# Patient Record
Sex: Male | Born: 1970 | Race: White | Hispanic: No | Marital: Married | Smoking: Never smoker
Health system: Southern US, Community
[De-identification: ages and names within clinical notes are randomized; demographics above are authoritative.]

## PROBLEM LIST (undated history)

## (undated) DIAGNOSIS — R7303 Prediabetes: Secondary | ICD-10-CM

## (undated) DIAGNOSIS — I10 Essential (primary) hypertension: Secondary | ICD-10-CM

## (undated) DIAGNOSIS — M1990 Unspecified osteoarthritis, unspecified site: Secondary | ICD-10-CM

## (undated) DIAGNOSIS — I471 Supraventricular tachycardia, unspecified: Secondary | ICD-10-CM

## (undated) DIAGNOSIS — I499 Cardiac arrhythmia, unspecified: Secondary | ICD-10-CM

## (undated) DIAGNOSIS — F419 Anxiety disorder, unspecified: Secondary | ICD-10-CM

## (undated) DIAGNOSIS — K219 Gastro-esophageal reflux disease without esophagitis: Secondary | ICD-10-CM

## (undated) DIAGNOSIS — G4730 Sleep apnea, unspecified: Secondary | ICD-10-CM

## (undated) HISTORY — DX: Supraventricular tachycardia, unspecified: I47.10

## (undated) HISTORY — DX: Gastro-esophageal reflux disease without esophagitis: K21.9

## (undated) HISTORY — DX: Essential (primary) hypertension: I10

## (undated) HISTORY — PX: JOINT REPLACEMENT: SHX530

## (undated) HISTORY — DX: Anxiety disorder, unspecified: F41.9

## (undated) HISTORY — DX: Supraventricular tachycardia: I47.1

## (undated) HISTORY — PX: ATRIAL ABLATION SURGERY: SHX560

---

## 2006-06-19 ENCOUNTER — Ambulatory Visit: Payer: Self-pay | Admitting: Internal Medicine

## 2006-07-03 ENCOUNTER — Ambulatory Visit: Payer: Self-pay

## 2006-08-09 ENCOUNTER — Ambulatory Visit: Payer: Self-pay | Admitting: Internal Medicine

## 2009-06-24 ENCOUNTER — Ambulatory Visit: Payer: Self-pay | Admitting: Cardiovascular Disease

## 2009-06-29 ENCOUNTER — Telehealth: Payer: Self-pay | Admitting: Cardiovascular Disease

## 2010-04-05 NOTE — Progress Notes (Signed)
  Phone Note Outgoing Call   Call placed by: Dessie Coma LPN Call placed to: Patient Summary of Call: LMVM-notified patient per Dr. Freida Busman, all labs are OK.  If any questions to call office.

## 2010-07-19 NOTE — Assessment & Plan Note (Signed)
[facility]                        [place] [facility] OFFICE NOTE   NAME:Kirsh, Mihcael                         MRN:          [id number]  DATE:06/24/2009                            DOB:          02/02/1971    CHIEF COMPLAINT:  Chest pain.   HISTORY OF PRESENT ILLNESS:  Mr. Thomas [name] is a 40 year old male, past  medical history significant for SVT status post ablation, anxiety,  alcohol use, hypertension, reflux who is presenting with several-week  history of worsening chest pain.  The patient states he has been having  substernal chest discomfort.  It is oftentimes elicited by swallowing  solids or liquids.  Sometimes at night, he has the pain without eating.  He also occasionally during the day has the discomfort without eating.  It usually lasts anywhere from a few minutes to an hour and half.  In  the past week, he has had 2 visits to the emergency room for this  problem.  As a result, he has had a couple sets of negative troponin and  underwent an exercise stress test last week, which he tolerated well,  but the official results of which are not currently available.  In  addition to the above complaint, the patient also endorses occasional  dizziness, headaches, palpitations associated with these symptoms.  He  also endorses pins and needles type feeling in his feet for the past  year.   PAST MEDICAL HISTORY:  As above in HPI.   SOCIAL HISTORY:  Tobacco, the patient uses smokeless tobacco.  He drinks  several alcoholic beverages a day.   FAMILY HISTORY:  Negative for premature coronary artery disease.   ALLERGIES:  No known drug allergies.   MEDICATIONS:  Metoprolol 100 mg b.i.d., hydroxyzine, zolpidem, verapamil  60 mg daily, an antacid, vitamin D, enalapril 20 mg daily.   REVIEW OF SYSTEMS:  As in HPI.  The patient also endorses anxiety.  Other systems were reviewed and are negative.   PHYSICAL EXAMINATION:  VITAL SIGNS:  The patient has  a blood pressure  123/81, pulse 83, satting 97% on room air, and he weighs 242 pounds.  GENERAL:  No acute distress.  HEENT:  Normocephalic, atraumatic.  NECK:  Supple.  There is no JVD.  No carotid bruits.  HEART:  Regular rate and rhythm without murmur, rub, or gallop.  LUNGS:  Clear bilaterally.  ABDOMEN:  Soft, nontender, nondistended.  EXTREMITIES:  Without edema.  SKIN:  Warm and dry.  MUSCULOSKELETAL:  Bilateral upper and lower extremity strength 5/5.   EKG taken today in clinic, independently interpreted by myself,  demonstrates normal sinus rhythm without any significant ST or T-wave  abnormalities.  I reviewed the patient's labs from June 23, 2009.  His  CMP is within normal limits with the exception of a CPK, it is mildly  elevated 510, CK-MB is 2.3.  His troponin is less than 0.01.  his chest  x-ray showed no acute disease.  He had an ultrasound of his abdomen that  showed no evidence of gallstones within the bulb, gallbladder and there  was fatty liver.  He had an echo from 2008 that shows normal ejection  fraction, no significant valvular regurgitation.   ASSESSMENT AND PLAN:  A 40 year old white male presenting with atypical  symptoms.  The patient has had a stress test and it will be a paramount  to get the results of the stress test.  If indeed it was negative, I  think it is highly unlikely he has a cardiac origin for these symptoms.  I have recommended he pursue gastrointestinal consultation for probable  esophagogastroduodenoscopy to rule out esophageal stricture and or  peptic ulcer disease.  I am also placing the patient on a 48-hour Holter  monitor as he does have a history of supraventricular tachycardia and  there may be recurrence, although it would not explain all of the  symptoms that he is presenting with.  He has an antacid at home, and I  recommend that he take it twice a day.  He drinks alcohol significantly  and the only lab abnormality I appreciate  was an elevated total CK,  which may be secondary to some low grade rhabdo from dehydration.  The  patient should remain well hydrated.  Lastly, I will check a D-dimer and  BNP.     Brayton El, MD  Electronically Signed    SGA/MedQ  DD: 06/24/2009  DT: 06/25/2009  Job #: [id number]

## 2010-07-19 NOTE — Assessment & Plan Note (Signed)
[facility]                         ELECTROPHYSIOLOGY OFFICE NOTE   NAME:Thomas [name], Thomas [name]                         MRN:          [id number]  DATE:08/09/2006                            DOB:          12/09/1970    Mr. Carcamo returns today for followup.  He is referred by Dr. Tomie China  and Dr. Sheria Lang for evaluation of palpitations, status post ablation.  The patient is a very pleasant 40 year old man who has a history of SVT  and underwent catheter ablation approximately one year ago.  He had  recurrent palpitations and was referred to me for additional evaluation.  He subsequently wore a cardiac monitor, and this has demonstrated sinus  tachycardia.  He continued to have palpitations.  These are almost never  with rest, typically with mild exertion while going from the sitting to  the standing and walking position.  He has had no syncope.   OTHER PAST MEDICAL HISTORY:  1. Hypertension.  2. He also has a history of pancreatitis and had a recent      hospitalization for this several weeks ago after going on an      alcoholic binge.   The patient otherwise has no specific complaints.   PHYSICAL EXAMINATION:  GENERAL:  He is a pleasant, well-appearing young  man in no distress.  VITAL SIGNS:  Blood pressure 122/88, the pulse was 88 and regular,  respirations were 18, the weight was 218 pounds.  NECK:  Revealed no jugular venous distention.  LUNGS:  Clear bilaterally to auscultation.  No wheezes, rales, or  rhonchi.  CARDIOVASCULAR:  Regular rate and rhythm, with normal S1, S2.  EXTREMITIES:  Demonstrated no edema.   MEDICATIONS:  1. Enalapril 20 mg daily.  2. Verapamil 120 daily.  3. Toprol-XL 100 twice a day.  4. Lorazepam.   IMPRESSION:  1. History of supraventricular tachycardia, status post ablation.  2. Recurrent palpitations.  3. Hypertension.  4. History of pancreatitis.   DISCUSSION:  Overall, today Mr. Mcgilvery is stable.  I have  recommended  that in addition to continuing his medications he continue to exercise  on a regular basis.  I think his underlying problem is relatively  inappropriate sinus tachycardia, perhaps exacerbated by some postural  orthostatic tachycardia.  I have recommended that he continue his  medications and that he  increase his activity and try to have daily aerobic exercise.  I will  plan to see him back in the office in one year, sooner should he have  any additional problems.     Doylene Canning. Ladona Ridgel, MD  Electronically Signed    GWT/MedQ  DD: 08/09/2006  DT: 08/09/2006  Job #: [id number]   cc:   Desmond Dike, M.D.

## 2010-07-22 NOTE — Assessment & Plan Note (Signed)
[facility]                         ELECTROPHYSIOLOGY OFFICE NOTE   NAME:Thomas [name], Thomas [name]                         MRN:          [id number]  DATE:06/19/2006                            DOB:          01-04-1971    REFERRING PHYSICIAN:  Desmond Dike, MD   REASON FOR CONSULTATION:  Evaluation of palpitations and recurrent  tachycardia in a patient who has undergone a catheter ablation of his  SVT.   HISTORY OF PRESENT ILLNESS:  The patient is a 40 year old man whose  health has been pretty good other than some hypertension and obesity. He  has noted palpitations for several years. These initially would start  suddenly and last for several minutes, sometimes even as long as an  hour. He could not make them terminate on his own but noted that if he  would lie down and fall asleep, when he awoke his heart racing would  have terminated. He underwent electrophysiologic study by Dr. Karie Chimera  over in [facility] back in July 2007. At that time, he had no sustained  inducible SVT but did have echo beats and on Isoproterenol had  nonsustained SVT which was consistent with AV node reentrant tachycardia  based on the short RP interval and the midline atrial activation of the  atria in tachycardia. He underwent slow pathway modification. It was  noted at that time that he did have what was described as inappropriate  sinus tachycardia based on a rapid sinus rate. Since his ablation, the  patient has continued to have palpations and has been on escalating  doses of sinus nodal blocking drugs. Despite this, he continues to feel  like his heart is beating faster than it should be and also notes that  he feels fatigued. He also states that he is gaining weight despite not  eating anymore than normally. He has had no syncope. He denies  peripheral edema. He states that his thyroid has been evaluated and was  not appreciably abnormal.   ADDITIONAL PAST MEDICAL HISTORY:   Notable for knee surgery in 1995.   FAMILY HISTORY:  Notable for both parents being alive with no major  medical problems.   SOCIAL HISTORY:  The patient works as a Psychologist, forensic. He  denies tobacco abuse presently though he did smoke in the past. He  denies alcohol or recreational drug use. He drinks 2 caffeinated  beverages daily.   REVIEW OF SYSTEMS:  He denies vision or hearing problems. He denies any  problems with swallowing. He denies nausea, vomiting, diarrhea, or  constipation, polyuria, polydipsia, heat or cold intolerance. He does  not some chest discomfort. He notes palpitations as noted. He denies  dyspnea or shortness of breath but does have dizziness and light-  headedness, particularly when it feels like his heart is racing. He also  admits to headaches. He denies peripheral edema. He does not have  claudication. The rest of his review of systems was negative except as  noted in the HPI.   PHYSICAL EXAMINATION:  GENERAL:  He is a pleasant, obese, 40 year old  man in no acute  distress.  VITAL SIGNS:  His blood pressure was 140/93, the pulse was 91 and  regular, the respirations were 18. The weight was 239 pounds.  HEENT:  Normocephalic, atraumatic. Pupils equal and round. The  oropharynx is moist, the sclera anicteric.  NECK:  Revealed no jugular venous distention. There was no thyromegaly.  Trachea was midline. The carotids are 2+ and symmetric.  LUNGS:  Clear bilaterally to auscultation. There were no wheezes, rales  or rhonchi. There was no increased work of breathing.  CARDIOVASCULAR:  Regular rate and rhythm with normal S1 and S2. The PMI  was not enlarged nor laterally displaced. I did not appreciate any  murmurs except I did note that there was a soft S4 gallop present.  ABDOMEN:  Obese, nontender, nondistended. There was no organomegaly. The  bowel sounds were present and there was no rebound or guarding.  EXTREMITIES:  Demonstrated no cyanosis,  clubbing or edema.  The pulses  were 2+ and symmetric.  NEUROLOGIC:  Alert and oriented x3 with cranial nerves intact. His  strength was 5/5 and symmetric.  SKIN:  Normal except for a laceration to his healing on his left  forearm.  JOINTS:  The joint exam was unremarkable as well.   MEDICATIONS:  1. Enalapril 20 mg daily.  2. Verapamil 120 mg 2 tablets daily now down to 1 tablet daily.  3. Metoprolol 50 mg 2 tablets twice daily.  4. Seroquel 100 mg daily.  5. Lorazepam 1 mg twice a day.  6. Bayer aspirin daily.   IMPRESSION:  1. Recurrent tachypalpitations after catheter ablation.  2. Hypertension.  3. Obesity.   DISCUSSION:  The patient's palpitations are of unclear etiology. They  may all be sinus tachycardia. I wonder what his sedentary lifestyle and  relative obesity playing with regard to increasing palpitations. I also  wonder whether his medical therapy is making him more fatigued. My plan  is the have him wean down off of his metoprolol, down off of his  verapamil and have him undergo exercise treadmill testing so that we can  better characterize the effect of exercise on his sinus node.  Particularly in light of his catheter ablation. I should also note that  he has first-degree heart block. Finally, we will have him wear a 3-week  CardioNet monitor to try to  better understand whether his palpitations are related to sinus  tachycardia or whether he has recurrence of SVT. We will plan to see him  back in the office in 5-6 weeks.     Doylene Canning. Ladona Ridgel, MD  Electronically Signed    GWT/MedQ  DD: 06/19/2006  DT: 06/19/2006  Job #: [id number]   cc:   Desmond Dike, MD

## 2010-11-27 ENCOUNTER — Encounter: Payer: Self-pay | Admitting: Cardiovascular Disease

## 2011-01-02 ENCOUNTER — Encounter: Payer: Self-pay | Admitting: Cardiovascular Disease

## 2016-12-05 ENCOUNTER — Ambulatory Visit: Payer: Self-pay | Admitting: Cardiology

## 2017-06-11 ENCOUNTER — Other Ambulatory Visit: Payer: Self-pay

## 2017-06-11 ENCOUNTER — Encounter (HOSPITAL_COMMUNITY): Payer: Self-pay | Admitting: *Deleted

## 2017-06-11 NOTE — Progress Notes (Signed)
Need orders in epic .  Surgery on 06/12/2017.

## 2017-06-11 NOTE — Progress Notes (Addendum)
[facility] and [facility] will have to have Medical Release Form for EKG done 09/2016 at the [facility].  It is located with fax cover on front of chart. For patient to sign and date for the EKG.   [facility] and placed on chart.  Will still need to send Signed Med Release Form.

## 2017-06-12 ENCOUNTER — Ambulatory Visit (HOSPITAL_COMMUNITY): Payer: Non-veteran care | Admitting: Anesthesiology

## 2017-06-12 ENCOUNTER — Ambulatory Visit (HOSPITAL_COMMUNITY): Payer: Non-veteran care

## 2017-06-12 ENCOUNTER — Encounter (HOSPITAL_COMMUNITY): Payer: Self-pay | Admitting: *Deleted

## 2017-06-12 ENCOUNTER — Encounter (HOSPITAL_COMMUNITY)
Admission: RE | Disposition: A | Discharge status: Home / Self Care | Payer: Self-pay | Source: Ambulatory Visit | Attending: Orthopedic Surgery

## 2017-06-12 ENCOUNTER — Other Ambulatory Visit: Payer: Self-pay

## 2017-06-12 ENCOUNTER — Inpatient Hospital Stay (HOSPITAL_COMMUNITY)
Admission: RE | Admit: 2017-06-12 | Discharge: 2017-06-13 | DRG: 488 | Disposition: A | Discharge status: Home / Self Care | Payer: Non-veteran care | Source: Ambulatory Visit | Attending: Orthopedic Surgery | Admitting: Orthopedic Surgery

## 2017-06-12 DIAGNOSIS — M9712XA Periprosthetic fracture around internal prosthetic left knee joint, initial encounter: Secondary | ICD-10-CM | POA: Diagnosis present

## 2017-06-12 DIAGNOSIS — I10 Essential (primary) hypertension: Secondary | ICD-10-CM | POA: Diagnosis present

## 2017-06-12 DIAGNOSIS — S82142A Displaced bicondylar fracture of left tibia, initial encounter for closed fracture: Principal | ICD-10-CM | POA: Diagnosis present

## 2017-06-12 DIAGNOSIS — G4730 Sleep apnea, unspecified: Secondary | ICD-10-CM | POA: Diagnosis present

## 2017-06-12 DIAGNOSIS — W19XXXA Unspecified fall, initial encounter: Secondary | ICD-10-CM | POA: Diagnosis present

## 2017-06-12 DIAGNOSIS — T84023A Instability of internal left knee prosthesis, initial encounter: Secondary | ICD-10-CM | POA: Diagnosis present

## 2017-06-12 DIAGNOSIS — M2500 Hemarthrosis, unspecified joint: Secondary | ICD-10-CM | POA: Diagnosis present

## 2017-06-12 DIAGNOSIS — F17290 Nicotine dependence, other tobacco product, uncomplicated: Secondary | ICD-10-CM | POA: Diagnosis present

## 2017-06-12 DIAGNOSIS — K219 Gastro-esophageal reflux disease without esophagitis: Secondary | ICD-10-CM | POA: Diagnosis present

## 2017-06-12 DIAGNOSIS — Z6841 Body Mass Index (BMI) 40.0 and over, adult: Secondary | ICD-10-CM

## 2017-06-12 DIAGNOSIS — Z419 Encounter for procedure for purposes other than remedying health state, unspecified: Secondary | ICD-10-CM

## 2017-06-12 HISTORY — DX: Unspecified osteoarthritis, unspecified site: M19.90

## 2017-06-12 HISTORY — PX: ORIF TIBIA PLATEAU: SHX2132

## 2017-06-12 HISTORY — DX: Sleep apnea, unspecified: G47.30

## 2017-06-12 HISTORY — DX: Prediabetes: R73.03

## 2017-06-12 LAB — CBC
HCT: 41.1 % (ref 39.0–52.0)
HEMOGLOBIN: 13.9 g/dL (ref 13.0–17.0)
MCH: 28.8 pg (ref 26.0–34.0)
MCHC: 33.8 g/dL (ref 30.0–36.0)
MCV: 85.3 fL (ref 78.0–100.0)
PLATELETS: 156 10*3/uL (ref 150–400)
RBC: 4.82 MIL/uL (ref 4.22–5.81)
RDW: 15.3 % (ref 11.5–15.5)
WBC: 5.9 10*3/uL (ref 4.0–10.5)

## 2017-06-12 LAB — TYPE AND SCREEN
ABO/RH(D): A POS
ANTIBODY SCREEN: NEGATIVE

## 2017-06-12 LAB — BASIC METABOLIC PANEL
ANION GAP: 7 (ref 5–15)
BUN: 21 mg/dL — ABNORMAL HIGH (ref 6–20)
CHLORIDE: 109 mmol/L (ref 101–111)
CO2: 26 mmol/L (ref 22–32)
Calcium: 8.5 mg/dL — ABNORMAL LOW (ref 8.9–10.3)
Creatinine, Ser: 0.68 mg/dL (ref 0.61–1.24)
Glucose, Bld: 96 mg/dL (ref 65–99)
POTASSIUM: 3.9 mmol/L (ref 3.5–5.1)
SODIUM: 142 mmol/L (ref 135–145)

## 2017-06-12 LAB — GLUCOSE, CAPILLARY: GLUCOSE-CAPILLARY: 85 mg/dL (ref 65–99)

## 2017-06-12 LAB — HEMOGLOBIN A1C
HEMOGLOBIN A1C: 5.5 % (ref 4.8–5.6)
Mean Plasma Glucose: 111.15 mg/dL

## 2017-06-12 LAB — ABO/RH: ABO/RH(D): A POS

## 2017-06-12 SURGERY — OPEN REDUCTION INTERNAL FIXATION (ORIF) TIBIAL PLATEAU
Anesthesia: Spinal | Laterality: Left

## 2017-06-12 MED ORDER — HYDROMORPHONE HCL 1 MG/ML IJ SOLN: 0.5000 mg | INTRAMUSCULAR | Status: DC | PRN

## 2017-06-12 MED ORDER — 0.9 % SODIUM CHLORIDE (POUR BTL) OPTIME
TOPICAL | Status: DC | PRN
  Administered 2017-06-12: 1000 mL

## 2017-06-12 MED ORDER — POLYETHYLENE GLYCOL 3350 17 G PO PACK
17.0000 g | PACK | Freq: Two times a day (BID) | ORAL | Status: DC
  Filled 2017-06-12: qty 1

## 2017-06-12 MED ORDER — METHOCARBAMOL 500 MG PO TABS
500.0000 mg | ORAL_TABLET | Freq: Four times a day (QID) | ORAL | Status: DC | PRN
  Administered 2017-06-12: 22:00:00 500 mg via ORAL
  Filled 2017-06-12: qty 1

## 2017-06-12 MED ORDER — METOCLOPRAMIDE HCL 5 MG/ML IJ SOLN: 5.0000 mg | Freq: Three times a day (TID) | INTRAMUSCULAR | Status: DC | PRN

## 2017-06-12 MED ORDER — ACETAMINOPHEN 325 MG PO TABS: 325.0000 mg | ORAL_TABLET | Freq: Four times a day (QID) | ORAL | Status: DC | PRN

## 2017-06-12 MED ORDER — FERROUS SULFATE 325 (65 FE) MG PO TABS
325.0000 mg | ORAL_TABLET | Freq: Three times a day (TID) | ORAL | Status: DC
  Administered 2017-06-13: 325 mg via ORAL
  Filled 2017-06-12: qty 1

## 2017-06-12 MED ORDER — TRAZODONE HCL 100 MG PO TABS
200.0000 mg | ORAL_TABLET | Freq: Every day | ORAL | Status: DC
  Administered 2017-06-12: 200 mg via ORAL
  Filled 2017-06-12: qty 2

## 2017-06-12 MED ORDER — PROPOFOL 10 MG/ML IV BOLUS
INTRAVENOUS | Status: AC
  Filled 2017-06-12: qty 20

## 2017-06-12 MED ORDER — OXYCODONE HCL 5 MG PO TABS: 10.0000 mg | ORAL_TABLET | ORAL | Status: DC | PRN

## 2017-06-12 MED ORDER — METHOCARBAMOL 1000 MG/10ML IJ SOLN
500.0000 mg | Freq: Four times a day (QID) | INTRAVENOUS | Status: DC | PRN
  Administered 2017-06-12: 500 mg via INTRAVENOUS
  Filled 2017-06-12: qty 550

## 2017-06-12 MED ORDER — DOCUSATE SODIUM 100 MG PO CAPS
100.0000 mg | ORAL_CAPSULE | Freq: Two times a day (BID) | ORAL | Status: DC
  Administered 2017-06-12 – 2017-06-13 (×2): 100 mg via ORAL
  Filled 2017-06-12 (×2): qty 1

## 2017-06-12 MED ORDER — PROPOFOL 500 MG/50ML IV EMUL
INTRAVENOUS | Status: DC | PRN
  Administered 2017-06-12: 25 ug/kg/min via INTRAVENOUS

## 2017-06-12 MED ORDER — KETAMINE HCL 10 MG/ML IJ SOLN
INTRAMUSCULAR | Status: AC
  Filled 2017-06-12: qty 1

## 2017-06-12 MED ORDER — KETAMINE HCL 10 MG/ML IJ SOLN
INTRAMUSCULAR | Status: DC | PRN
  Administered 2017-06-12 (×2): 10 mg via INTRAVENOUS
  Administered 2017-06-12: 20 mg via INTRAVENOUS

## 2017-06-12 MED ORDER — FENTANYL CITRATE (PF) 100 MCG/2ML IJ SOLN
100.0000 ug | Freq: Once | INTRAMUSCULAR | Status: AC
  Administered 2017-06-12: 50 ug via INTRAVENOUS
  Filled 2017-06-12: qty 2

## 2017-06-12 MED ORDER — ASPIRIN EC 325 MG PO TBEC
325.0000 mg | DELAYED_RELEASE_TABLET | Freq: Two times a day (BID) | ORAL | Status: DC
  Administered 2017-06-13: 325 mg via ORAL
  Filled 2017-06-12: qty 1

## 2017-06-12 MED ORDER — HYDROXYZINE HCL 25 MG PO TABS: 25.0000 mg | ORAL_TABLET | Freq: Three times a day (TID) | ORAL | Status: DC | PRN

## 2017-06-12 MED ORDER — FENTANYL CITRATE (PF) 100 MCG/2ML IJ SOLN: 25.0000 ug | INTRAMUSCULAR | Status: DC | PRN

## 2017-06-12 MED ORDER — ONDANSETRON HCL 4 MG PO TABS
4.0000 mg | ORAL_TABLET | Freq: Four times a day (QID) | ORAL | Status: DC | PRN
Start: 2017-06-12 — End: 2017-06-13

## 2017-06-12 MED ORDER — OXYCODONE HCL 5 MG/5ML PO SOLN
5.0000 mg | Freq: Once | ORAL | Status: DC | PRN
  Filled 2017-06-12: qty 5

## 2017-06-12 MED ORDER — MIDAZOLAM HCL 2 MG/2ML IJ SOLN
INTRAMUSCULAR | Status: AC
  Filled 2017-06-12: qty 2

## 2017-06-12 MED ORDER — OXYCODONE HCL 5 MG PO TABS: 5.0000 mg | ORAL_TABLET | Freq: Once | ORAL | Status: DC | PRN

## 2017-06-12 MED ORDER — VERAPAMIL HCL 120 MG PO TABS
120.0000 mg | ORAL_TABLET | Freq: Every day | ORAL | Status: DC
  Administered 2017-06-13: 120 mg via ORAL
  Filled 2017-06-12: qty 1

## 2017-06-12 MED ORDER — METOCLOPRAMIDE HCL 5 MG PO TABS: 5.0000 mg | ORAL_TABLET | Freq: Three times a day (TID) | ORAL | Status: DC | PRN

## 2017-06-12 MED ORDER — ONDANSETRON HCL 4 MG/2ML IJ SOLN
INTRAMUSCULAR | Status: DC | PRN
  Administered 2017-06-12: 4 mg via INTRAVENOUS

## 2017-06-12 MED ORDER — NON FORMULARY: 20.0000 mg | Freq: Every day | Status: DC

## 2017-06-12 MED ORDER — DEXAMETHASONE SODIUM PHOSPHATE 10 MG/ML IJ SOLN: 10.0000 mg | Freq: Once | INTRAMUSCULAR | Status: DC

## 2017-06-12 MED ORDER — PHENYLEPHRINE 40 MCG/ML (10ML) SYRINGE FOR IV PUSH (FOR BLOOD PRESSURE SUPPORT)
PREFILLED_SYRINGE | INTRAVENOUS | Status: DC | PRN
  Administered 2017-06-12: 80 ug via INTRAVENOUS
  Administered 2017-06-12: 120 ug via INTRAVENOUS
  Administered 2017-06-12 (×3): 80 ug via INTRAVENOUS

## 2017-06-12 MED ORDER — OMEPRAZOLE 20 MG PO CPDR
20.0000 mg | DELAYED_RELEASE_CAPSULE | Freq: Every day | ORAL | Status: DC
  Administered 2017-06-13: 20 mg via ORAL
  Filled 2017-06-12: qty 1

## 2017-06-12 MED ORDER — PROMETHAZINE HCL 25 MG/ML IJ SOLN: 6.2500 mg | INTRAMUSCULAR | Status: DC | PRN

## 2017-06-12 MED ORDER — CELECOXIB 200 MG PO CAPS
200.0000 mg | ORAL_CAPSULE | Freq: Two times a day (BID) | ORAL | Status: DC
  Administered 2017-06-12 – 2017-06-13 (×2): 200 mg via ORAL
  Filled 2017-06-12 (×2): qty 1

## 2017-06-12 MED ORDER — BISACODYL 10 MG RE SUPP: 10.0000 mg | Freq: Every day | RECTAL | Status: DC | PRN

## 2017-06-12 MED ORDER — MEPERIDINE HCL 50 MG/ML IJ SOLN: 6.2500 mg | INTRAMUSCULAR | Status: DC | PRN

## 2017-06-12 MED ORDER — ONDANSETRON HCL 4 MG/2ML IJ SOLN: 4.0000 mg | Freq: Four times a day (QID) | INTRAMUSCULAR | Status: DC | PRN

## 2017-06-12 MED ORDER — SODIUM CHLORIDE 0.9 % IV SOLN
INTRAVENOUS | Status: DC
  Administered 2017-06-12: 17:00:00 via INTRAVENOUS

## 2017-06-12 MED ORDER — MAGNESIUM CITRATE PO SOLN: 1.0000 | Freq: Once | ORAL | Status: DC | PRN

## 2017-06-12 MED ORDER — PHENYLEPHRINE HCL 10 MG/ML IJ SOLN
INTRAVENOUS | Status: DC | PRN
  Administered 2017-06-12: 25 ug/min via INTRAVENOUS

## 2017-06-12 MED ORDER — PHENOL 1.4 % MT LIQD
1.0000 | OROMUCOSAL | Status: DC | PRN
  Filled 2017-06-12: qty 177

## 2017-06-12 MED ORDER — ESCITALOPRAM OXALATE 20 MG PO TABS
20.0000 mg | ORAL_TABLET | Freq: Every day | ORAL | Status: DC
  Administered 2017-06-13: 09:00:00 20 mg via ORAL
  Filled 2017-06-12: qty 1

## 2017-06-12 MED ORDER — CEFAZOLIN SODIUM-DEXTROSE 2-4 GM/100ML-% IV SOLN
2.0000 g | Freq: Four times a day (QID) | INTRAVENOUS | Status: AC
  Administered 2017-06-12 – 2017-06-13 (×2): 2 g via INTRAVENOUS
  Filled 2017-06-12 (×2): qty 100

## 2017-06-12 MED ORDER — METOPROLOL TARTRATE 50 MG PO TABS
100.0000 mg | ORAL_TABLET | Freq: Two times a day (BID) | ORAL | Status: DC
  Administered 2017-06-12 – 2017-06-13 (×2): 100 mg via ORAL
  Filled 2017-06-12 (×2): qty 2

## 2017-06-12 MED ORDER — FENTANYL CITRATE (PF) 250 MCG/5ML IJ SOLN
INTRAMUSCULAR | Status: AC
  Filled 2017-06-12: qty 5

## 2017-06-12 MED ORDER — LIDOCAINE 2% (20 MG/ML) 5 ML SYRINGE
INTRAMUSCULAR | Status: DC | PRN
  Administered 2017-06-12: 40 mg via INTRAVENOUS

## 2017-06-12 MED ORDER — CEFAZOLIN SODIUM 10 G IJ SOLR
3.0000 g | INTRAMUSCULAR | Status: AC
  Administered 2017-06-12: 3 g via INTRAVENOUS
  Filled 2017-06-12: qty 3

## 2017-06-12 MED ORDER — DEXAMETHASONE SODIUM PHOSPHATE 10 MG/ML IJ SOLN
INTRAMUSCULAR | Status: DC | PRN
  Administered 2017-06-12: 10 mg via INTRAVENOUS

## 2017-06-12 MED ORDER — MIDAZOLAM HCL 2 MG/2ML IJ SOLN
2.0000 mg | Freq: Once | INTRAMUSCULAR | Status: AC
  Administered 2017-06-12: 2 mg via INTRAVENOUS
  Filled 2017-06-12: qty 2

## 2017-06-12 MED ORDER — MENTHOL 3 MG MT LOZG: 1.0000 | LOZENGE | OROMUCOSAL | Status: DC | PRN

## 2017-06-12 MED ORDER — STERILE WATER FOR IRRIGATION IR SOLN
Status: DC | PRN
  Administered 2017-06-12: 2000 mL

## 2017-06-12 MED ORDER — PROPOFOL 10 MG/ML IV BOLUS
INTRAVENOUS | Status: DC | PRN
  Administered 2017-06-12 (×3): 10 mg via INTRAVENOUS

## 2017-06-12 MED ORDER — ALUM & MAG HYDROXIDE-SIMETH 200-200-20 MG/5ML PO SUSP: 15.0000 mL | ORAL | Status: DC | PRN

## 2017-06-12 MED ORDER — CHLORHEXIDINE GLUCONATE 4 % EX LIQD
60.0000 mL | Freq: Once | CUTANEOUS | Status: AC
  Administered 2017-06-12: 4 via TOPICAL

## 2017-06-12 MED ORDER — OXYCODONE HCL 5 MG PO TABS
5.0000 mg | ORAL_TABLET | ORAL | Status: DC | PRN
  Administered 2017-06-12: 21:00:00 10 mg via ORAL
  Administered 2017-06-12 (×2): 5 mg via ORAL
  Administered 2017-06-13 (×3): 10 mg via ORAL
  Filled 2017-06-12 (×2): qty 2
  Filled 2017-06-12: qty 1
  Filled 2017-06-12 (×2): qty 2
  Filled 2017-06-12: qty 1
  Filled 2017-06-12: qty 2

## 2017-06-12 MED ORDER — LACTATED RINGERS IV SOLN
INTRAVENOUS | Status: DC
  Administered 2017-06-12 (×3): via INTRAVENOUS

## 2017-06-12 MED ORDER — PREGABALIN 100 MG PO CAPS
200.0000 mg | ORAL_CAPSULE | Freq: Three times a day (TID) | ORAL | Status: DC
  Administered 2017-06-12 – 2017-06-13 (×3): 200 mg via ORAL
  Filled 2017-06-12 (×3): qty 2

## 2017-06-12 MED ORDER — BUPIVACAINE IN DEXTROSE 0.75-8.25 % IT SOLN
INTRATHECAL | Status: DC | PRN
  Administered 2017-06-12: 2 mL via INTRATHECAL

## 2017-06-12 SURGICAL SUPPLY — 84 items
BAG ZIPLOCK 12X15 (MISCELLANEOUS) ×3 IMPLANT
BANDAGE ACE 6X5 VEL STRL LF (GAUZE/BANDAGES/DRESSINGS) ×3 IMPLANT
BEARING OX PART MENISCL LT TIB (Orthopedic Implant) ×1 IMPLANT
BIT DRILL 2.5X2.75 QC CALB (BIT) ×3 IMPLANT
BLADE SAW SGTL 11.0X1.19X90.0M (BLADE) IMPLANT
BNDG COHESIVE 4X5 TAN STRL (GAUZE/BANDAGES/DRESSINGS) ×3 IMPLANT
BNDG COHESIVE 6X5 TAN STRL LF (GAUZE/BANDAGES/DRESSINGS) ×3 IMPLANT
CLOSURE WOUND 1/2 X4 (GAUZE/BANDAGES/DRESSINGS)
COVER SURGICAL LIGHT HANDLE (MISCELLANEOUS) ×3 IMPLANT
CUFF TOURN SGL QUICK 34 (TOURNIQUET CUFF)
CUFF TOURN SGL QUICK 44 (TOURNIQUET CUFF) ×3 IMPLANT
CUFF TRNQT CYL 34X4X40X1 (TOURNIQUET CUFF) IMPLANT
DERMABOND ADVANCED (GAUZE/BANDAGES/DRESSINGS) ×2
DERMABOND ADVANCED .7 DNX12 (GAUZE/BANDAGES/DRESSINGS) ×1 IMPLANT
DRAPE C-ARM 42X120 X-RAY (DRAPES) ×3 IMPLANT
DRAPE INCISE IOBAN 66X45 STRL (DRAPES) ×3 IMPLANT
DRAPE SHEET LG 3/4 BI-LAMINATE (DRAPES) ×3 IMPLANT
DRAPE U-SHAPE 47X51 STRL (DRAPES) ×3 IMPLANT
DRESSING AQUACEL AG SP 3.5X10 (GAUZE/BANDAGES/DRESSINGS) ×1 IMPLANT
DRSG AQUACEL AG SP 3.5X10 (GAUZE/BANDAGES/DRESSINGS) ×3
DRSG EMULSION OIL 3X16 NADH (GAUZE/BANDAGES/DRESSINGS) IMPLANT
DURAPREP 26ML APPLICATOR (WOUND CARE) ×3 IMPLANT
ELECT REM PT RETURN 15FT ADLT (MISCELLANEOUS) ×3 IMPLANT
GAUZE SPONGE 4X4 12PLY STRL (GAUZE/BANDAGES/DRESSINGS) IMPLANT
GLOVE BIOGEL M 7.0 STRL (GLOVE) IMPLANT
GLOVE BIOGEL PI IND STRL 7.0 (GLOVE) ×2 IMPLANT
GLOVE BIOGEL PI IND STRL 7.5 (GLOVE) ×2 IMPLANT
GLOVE BIOGEL PI IND STRL 8.5 (GLOVE) ×1 IMPLANT
GLOVE BIOGEL PI INDICATOR 7.0 (GLOVE) ×4
GLOVE BIOGEL PI INDICATOR 7.5 (GLOVE) ×4
GLOVE BIOGEL PI INDICATOR 8.5 (GLOVE) ×2
GLOVE ECLIPSE 7.5 STRL STRAW (GLOVE) ×9 IMPLANT
GLOVE ECLIPSE 8.0 STRL XLNG CF (GLOVE) IMPLANT
GLOVE ORTHO TXT STRL SZ7.5 (GLOVE) ×6 IMPLANT
GLOVE SURG ORTHO 8.0 STRL STRW (GLOVE) ×3 IMPLANT
GOWN STRL REUS W/TWL LRG LVL3 (GOWN DISPOSABLE) ×6 IMPLANT
GOWN STRL REUS W/TWL XL LVL3 (GOWN DISPOSABLE) ×6 IMPLANT
HANDPIECE INTERPULSE COAX TIP (DISPOSABLE) ×2
IMMOBILIZER KNEE 20 (SOFTGOODS) ×3
IMMOBILIZER KNEE 20 THIGH 36 (SOFTGOODS) ×1 IMPLANT
IMMOBILIZER KNEE 22 (SOFTGOODS) ×3 IMPLANT
IMMOBILIZER KNEE 22 UNIV (SOFTGOODS) ×3 IMPLANT
KIT BASIN OR (CUSTOM PROCEDURE TRAY) ×3 IMPLANT
MANIFOLD NEPTUNE II (INSTRUMENTS) ×3 IMPLANT
NS IRRIG 1000ML POUR BTL (IV SOLUTION) ×3 IMPLANT
OX PART MENISCAL BEAR LT TIBIA (Orthopedic Implant) ×3 IMPLANT
PACK ORTHO EXTREMITY (CUSTOM PROCEDURE TRAY) ×3 IMPLANT
PAD CAST 4YDX4 CTTN HI CHSV (CAST SUPPLIES) IMPLANT
PADDING CAST COTTON 4X4 STRL (CAST SUPPLIES)
PLATE ACE LRG 74.7X35.6X1X3 HL (Plate) ×1 IMPLANT
PLATE ACE META (Plate) ×2 IMPLANT
POSITIONER SURGICAL ARM (MISCELLANEOUS) ×3 IMPLANT
SCREW CORT FT 32X3.5XNONLOCK (Screw) ×1 IMPLANT
SCREW CORTICAL 3.5MM  30MM (Screw) ×2 IMPLANT
SCREW CORTICAL 3.5MM  32MM (Screw) ×2 IMPLANT
SCREW CORTICAL 3.5MM  34MM (Screw) ×2 IMPLANT
SCREW CORTICAL 3.5MM  60MM (Screw) ×2 IMPLANT
SCREW CORTICAL 3.5MM 30MM (Screw) ×1 IMPLANT
SCREW CORTICAL 3.5MM 34MM (Screw) ×1 IMPLANT
SCREW CORTICAL 3.5MM 36MM (Screw) ×3 IMPLANT
SCREW CORTICAL 3.5MM 40MM (Screw) ×3 IMPLANT
SCREW CORTICAL 3.5MM 60MM (Screw) ×1 IMPLANT
SET HNDPC FAN SPRY TIP SCT (DISPOSABLE) ×1 IMPLANT
SET PAD KNEE POSITIONER (MISCELLANEOUS) ×3 IMPLANT
SPONGE LAP 18X18 X RAY DECT (DISPOSABLE) IMPLANT
STOCKINETTE 8 INCH (MISCELLANEOUS) ×3 IMPLANT
STRIP CLOSURE SKIN 1/2X4 (GAUZE/BANDAGES/DRESSINGS) IMPLANT
SUCTION FRAZIER HANDLE 10FR (MISCELLANEOUS) ×2
SUCTION TUBE FRAZIER 10FR DISP (MISCELLANEOUS) ×1 IMPLANT
SUT ETHILON 4 0 PS 2 18 (SUTURE) IMPLANT
SUT MNCRL AB 3-0 PS2 18 (SUTURE) ×3 IMPLANT
SUT MNCRL AB 4-0 PS2 18 (SUTURE) IMPLANT
SUT STRATAFIX PDS+ 0 24IN (SUTURE) ×3 IMPLANT
SUT VIC AB 0 CT1 36 (SUTURE) ×3 IMPLANT
SUT VIC AB 1 CT1 27 (SUTURE) ×6
SUT VIC AB 1 CT1 27XBRD ANTBC (SUTURE) ×3 IMPLANT
SUT VIC AB 1 CT1 36 (SUTURE) ×3 IMPLANT
SUT VIC AB 2-0 CT1 27 (SUTURE) ×4
SUT VIC AB 2-0 CT1 TAPERPNT 27 (SUTURE) ×2 IMPLANT
SUT VIC AB 2-0 CT2 27 (SUTURE) ×3 IMPLANT
TOWEL OR 17X26 10 PK STRL BLUE (TOWEL DISPOSABLE) ×6 IMPLANT
TRAY FOLEY CATH 16FRSI W/METER (SET/KITS/TRAYS/PACK) ×3 IMPLANT
WATER STERILE IRR 1000ML POUR (IV SOLUTION) ×3 IMPLANT
WRAP KNEE MAXI GEL POST OP (GAUZE/BANDAGES/DRESSINGS) ×3 IMPLANT

## 2017-06-12 NOTE — H&P (Signed)
Thomas [name] is an 47 y.o. male.    Chief Complaint: Left medial tibial plateau fracture with dislocation of poly from UKR  Procedure:  ORIF left medial tibial plateau fracture with poly revision of medial UKR  HPI: Pt is a 47 y.o. male complaining of left knee pain after sustaining a fall.  He presented to the ER where he was diagnosed with a left medial tibial plateau fracture with dislocation of poly from UKR. He was place in a KI made NWB and told to follow up in the clinic.  In the clinic he was seen and determined that he would need to have surgery.  It was discussed that he would need a ORIF left medial tibial plateau fracture with poly revision of medial UKR.   Various options are discussed with the patient. Risks, benefits and expectations were discussed with the patient. Patient understand the risks, benefits and expectations and wishes to proceed with surgery.   PCP: Sondra Come, MD  D/C Plans:       Home   Post-op Meds:       No Rx given   Tranexamic Acid:      To be given - IV   Decadron:      Is to be given  FYI:     ASA  Norco  CPAP  DME:   Pt already has equipment   PT:   To be determined   PMH: Past Medical History:  Diagnosis Date  . Arthritis   . Chest pain   . GERD (gastroesophageal reflux disease)   . HTN (hypertension)   . Pre-diabetes   . Sleep apnea    cpap   . SVT (supraventricular tachycardia) (HCC)     PSH: Past Surgical History:  Procedure Laterality Date  . ATRIAL ABLATION SURGERY      Social History:  reports that he has never smoked. His smokeless tobacco use includes chew and snuff. He reports that he does not drink alcohol or use drugs.  Allergies:  No Known Allergies  Medications: Current Facility-Administered Medications  Medication Dose Route Frequency Provider Last Rate Last Dose  . ceFAZolin (ANCEF) 3 g in dextrose 5 % 50 mL IVPB  3 g Intravenous On Call to OR Timtohy Broski, Molli Hazard, PA-C      . chlorhexidine (HIBICLENS) 4  % liquid 4 application  60 mL Topical Once Loreto Loescher, PA-C      . fentaNYL (SUBLIMAZE) injection 100 mcg  100 mcg Intravenous Once Lewie Loron, MD      . lactated ringers infusion   Intravenous Continuous Lewie Loron, MD      . midazolam (VERSED) injection 2 mg  2 mg Intravenous Once Lewie Loron, MD        Results for orders placed or performed during the hospital encounter of 06/12/17 (from the past 48 hour(s))  Glucose, capillary     Status: None   Collection Time: 06/12/17  8:44 AM  Result Value Ref Range   Glucose-Capillary 85 65 - 99 mg/dL      Review of Systems  Constitutional: Negative.   HENT: Negative.   Eyes: Negative.   Respiratory: Negative.   Cardiovascular: Negative.   Gastrointestinal: Positive for heartburn.  Genitourinary: Negative.   Musculoskeletal: Positive for joint pain.  Skin: Negative.   Neurological: Negative.   Endo/Heme/Allergies: Negative.   Psychiatric/Behavioral: Negative.        Physical Exam  Constitutional: He is oriented to person, place, and time. He appears well-developed.  HENT:  Head: Normocephalic.  Eyes: Pupils are equal, round, and reactive to light.  Neck: Neck supple. No JVD present. No tracheal deviation present. No thyromegaly present.  Cardiovascular: Normal rate, regular rhythm and intact distal pulses.  Respiratory: Effort normal and breath sounds normal. No respiratory distress. He has no wheezes.  GI: Soft. There is no tenderness. There is no guarding.  Musculoskeletal:       Left knee: He exhibits decreased range of motion, swelling, laceration (healed previous incision) and bony tenderness. He exhibits no effusion, no deformity and no erythema. Tenderness found. Medial joint line tenderness noted.  Lymphadenopathy:    He has no cervical adenopathy.  Neurological: He is alert and oriented to person, place, and time.  Skin: Skin is warm and dry.  Psychiatric: He has a normal mood and affect.        Assessment/Plan Assessment:   Left medial tibial plateau fracture with dislocation of poly from UKR  Plan: Patient will undergo a ORIF left medial tibial plateau fracture with poly revision of medial UKR on 06/12/2017 [facility]. Risks benefits and expectations were discussed with the patient. Patient understand risks, benefits and expectations and wishes to proceed.   Anastasio AuerbachMatthew S. Khi Mcmillen   PA-C  06/12/2017, 9:39 AM

## 2017-06-12 NOTE — Transfer of Care (Signed)
Immediate Anesthesia Transfer of Care Note  Patient: Thomas [name]  Procedure(s) Performed: Left open reduction internal fixation medial tibial plateau fracture with poly revision medial compartmental arthroplasty (Left )  Patient Location: PACU  Anesthesia Type:Spinal  Level of Consciousness: awake and alert   Airway & Oxygen Therapy: Patient Spontanous Breathing and Patient connected to face mask oxygen  Post-op Assessment: Report given to RN and Post -op Vital signs reviewed and stable  Post vital signs: Reviewed and stable  Last Vitals:  Vitals Value Taken Time  BP 119/70 06/12/2017  1:40 PM  Temp    Pulse 63 06/12/2017  1:42 PM  Resp 11 06/12/2017  1:42 PM  SpO2 100 % 06/12/2017  1:42 PM  Vitals shown include unvalidated device data.  Last Pain:  Vitals:   06/12/17 0950  TempSrc:   PainSc: 0-No pain         Complications: No apparent anesthesia complications

## 2017-06-12 NOTE — Brief Op Note (Signed)
06/12/2017  11:44 AM  PATIENT:  Thomas [name]  47 y.o. male  PRE-OPERATIVE DIAGNOSIS:  Left medial tibial plateau fracture with dislocation polyethylene from partial knee replacement  POST-OPERATIVE DIAGNOSIS:   Left medial tibial plateau fracture with dislocation polyethylene from partial knee replacement  PROCEDURE:  Procedure(s) with comments: Left open reduction internal fixation medial tibial plateau fracture with poly revision medial compartmental arthroplasty (Left) - 90 mins  SURGEON:  Surgeon(s) and Role:    Durene Romans* Alayziah Tangeman, MD - Primary  PHYSICIAN ASSISTANT: Skip MayerBlair Roberts, PA-C   ANESTHESIA:   regional and spinal  EBL:  <50 cc   BLOOD ADMINISTERED:none  DRAINS: none   LOCAL MEDICATIONS USED:  NONE  SPECIMEN:  No Specimen  DISPOSITION OF SPECIMEN:  N/A  COUNTS:  YES  TOURNIQUET:  31 min at 250 mm Hg  DICTATION: .Other Dictation: Dictation Number [id number]  PLAN OF CARE: Admit to inpatient   PATIENT DISPOSITION:  PACU - hemodynamically stable.   Delay start of Pharmacological VTE agent (>24hrs) due to surgical blood loss or risk of bleeding: no

## 2017-06-12 NOTE — Anesthesia Procedure Notes (Signed)
Spinal  Patient location during procedure: OR End time: 06/12/2017 11:41 AM Staffing Resident/CRNA: Minerva EndsMirarchi, Aryaa Bunting M, CRNA Performed: resident/CRNA  Preanesthetic Checklist Completed: patient identified, site marked, surgical consent, pre-op evaluation, timeout performed, IV checked, risks and benefits discussed and monitors and equipment checked Spinal Block Patient position: sitting Prep: ChloraPrep and site prepped and draped Patient monitoring: heart rate, continuous pulse ox, blood pressure and cardiac monitor Approach: midline Location: L3-4 Injection technique: single-shot Needle Needle type: Sprotte  Needle gauge: 24 G Assessment Sensory level: T4 Additional Notes Expiration date of tray noted and within date.   Patient tolerated procedure well. Prep dry at time of needle puncture- Germeroth present assisted and supervised--

## 2017-06-12 NOTE — Anesthesia Procedure Notes (Addendum)
Procedure Name: MAC Date/Time: 06/12/2017 11:28 AM Performed by: Cynda Familia, CRNA Pre-anesthesia Checklist: Patient identified, Suction available, Emergency Drugs available, Patient being monitored and Timeout performed Patient Re-evaluated:Patient Re-evaluated prior to induction Oxygen Delivery Method: Simple face mask Placement Confirmation: positive ETCO2 and breath sounds checked- equal and bilateral Dental Injury: Teeth and Oropharynx as per pre-operative assessment

## 2017-06-12 NOTE — Anesthesia Preprocedure Evaluation (Addendum)
Anesthesia Evaluation  Patient identified by MRN, date of birth, ID band Patient awake    Reviewed: Allergy & Precautions, NPO status , Patient's Chart, lab work & pertinent test results, reviewed documented beta blocker date and time   Airway Mallampati: II  TM Distance: >3 FB Neck ROM: Full    Dental no notable dental hx. (+) Dental Advisory Given   Pulmonary sleep apnea ,    Pulmonary exam normal breath sounds clear to auscultation       Cardiovascular hypertension, Pt. on home beta blockers and Pt. on medications Normal cardiovascular exam Rhythm:Regular Rate:Normal     Neuro/Psych negative neurological ROS  negative psych ROS   GI/Hepatic Neg liver ROS, GERD  ,  Endo/Other  Morbid obesity  Renal/GU negative Renal ROS     Musculoskeletal  (+) Arthritis ,   Abdominal (+) + obese,   Peds  Hematology negative hematology ROS (+)   Anesthesia Other Findings   Reproductive/Obstetrics negative OB ROS                            Anesthesia Physical Anesthesia Plan  ASA: III  Anesthesia Plan: Spinal   Post-op Pain Management:  Regional for Post-op pain   Induction: Intravenous  PONV Risk Score and Plan: 2 and Ondansetron and Dexamethasone  Airway Management Planned:   Additional Equipment:   Intra-op Plan:   Post-operative Plan:   Informed Consent: I have reviewed the patients History and Physical, chart, labs and discussed the procedure including the risks, benefits and alternatives for the proposed anesthesia with the patient or authorized representative who has indicated his/her understanding and acceptance.   Dental advisory given  Plan Discussed with: CRNA  Anesthesia Plan Comments:        Anesthesia Quick Evaluation

## 2017-06-12 NOTE — Anesthesia Procedure Notes (Signed)
Date/Time: 06/12/2017 12:00 PM Performed by: Minerva EndsMirarchi, Dalary Hollar M, CRNA Tube type: nasal AW placed orally with ETT adaptor--- to anes machine--- PS set at 10 cm- Germeroth assited with AW management. Placement Confirmation: positive ETCO2 and breath sounds checked- equal and bilateral Dental Injury: Teeth and Oropharynx as per pre-operative assessment

## 2017-06-12 NOTE — Anesthesia Postprocedure Evaluation (Signed)
Anesthesia Post Note  Patient: Thomas [name]  Procedure(s) Performed: Left open reduction internal fixation medial tibial plateau fracture with poly revision medial compartmental arthroplasty (Left )     Patient location during evaluation: PACU Anesthesia Type: Spinal Level of consciousness: awake and alert Pain management: pain level controlled Vital Signs Assessment: post-procedure vital signs reviewed and stable Respiratory status: spontaneous breathing and respiratory function stable Cardiovascular status: blood pressure returned to baseline and stable Postop Assessment: spinal receding Anesthetic complications: no    Last Vitals:  Vitals:   06/12/17 1530 06/12/17 1616  BP: 114/80 (!) 143/84  Pulse: (!) 55 61  Resp: 15 18  Temp:    SpO2: 100% 100%    Last Pain:  Vitals:   06/12/17 1659  TempSrc:   PainSc: 4                  Lewie LoronJohn Mishael Krysiak

## 2017-06-12 NOTE — Progress Notes (Signed)
AssistedDr. Germeroth with left, ultrasound guided, adductor canal block. Side rails up, monitors on throughout procedure. See vital signs in flow sheet. Tolerated Procedure well.  

## 2017-06-13 ENCOUNTER — Encounter (HOSPITAL_COMMUNITY): Payer: Self-pay | Admitting: Orthopedic Surgery

## 2017-06-13 DIAGNOSIS — S82142A Displaced bicondylar fracture of left tibia, initial encounter for closed fracture: Secondary | ICD-10-CM | POA: Diagnosis not present

## 2017-06-13 LAB — BASIC METABOLIC PANEL
Anion gap: 8 (ref 5–15)
BUN: 9 mg/dL (ref 6–20)
CALCIUM: 9.1 mg/dL (ref 8.9–10.3)
CO2: 28 mmol/L (ref 22–32)
CREATININE: 0.63 mg/dL (ref 0.61–1.24)
Chloride: 103 mmol/L (ref 101–111)
GFR calc Af Amer: >60 mL/min (ref >60)
GFR calc non Af Amer: >60 mL/min (ref >60)
GLUCOSE: 173 mg/dL — AB (ref 65–99)
Potassium: 4.3 mmol/L (ref 3.5–5.1)
Sodium: 139 mmol/L (ref 135–145)

## 2017-06-13 LAB — CBC
HEMATOCRIT: 39.6 % (ref 39.0–52.0)
Hemoglobin: 13.6 g/dL (ref 13.0–17.0)
MCH: 28.8 pg (ref 26.0–34.0)
MCHC: 34.3 g/dL (ref 30.0–36.0)
MCV: 83.7 fL (ref 78.0–100.0)
PLATELETS: 116 10*3/uL — AB (ref 150–400)
RBC: 4.73 MIL/uL (ref 4.22–5.81)
RDW: 14.6 % (ref 11.5–15.5)
WBC: 10.5 10*3/uL (ref 4.0–10.5)

## 2017-06-13 MED ORDER — POLYETHYLENE GLYCOL 3350 17 G PO PACK: 17.0000 g | PACK | Freq: Two times a day (BID) | ORAL | 0 refills | Status: DC

## 2017-06-13 MED ORDER — DOCUSATE SODIUM 100 MG PO CAPS: 100.0000 mg | ORAL_CAPSULE | Freq: Two times a day (BID) | ORAL | 0 refills | Status: DC

## 2017-06-13 MED ORDER — METHOCARBAMOL 500 MG PO TABS: 500.0000 mg | ORAL_TABLET | Freq: Four times a day (QID) | ORAL | 0 refills | Status: DC | PRN

## 2017-06-13 MED ORDER — ASPIRIN 325 MG PO TBEC: 325.0000 mg | DELAYED_RELEASE_TABLET | Freq: Two times a day (BID) | ORAL | 0 refills | Status: AC

## 2017-06-13 MED ORDER — ACETAMINOPHEN 500 MG PO TABS: 1000.0000 mg | ORAL_TABLET | Freq: Three times a day (TID) | ORAL | 0 refills | Status: DC

## 2017-06-13 MED ORDER — OXYCODONE HCL 5 MG PO TABS: 5.0000 mg | ORAL_TABLET | ORAL | 0 refills | Status: AC | PRN

## 2017-06-13 MED ORDER — FERROUS SULFATE 325 (65 FE) MG PO TABS: 325.0000 mg | ORAL_TABLET | Freq: Three times a day (TID) | ORAL | 3 refills | Status: DC

## 2017-06-13 NOTE — Op Note (Signed)
NAME:  , Thomas [name]                       ACCOUNT NO.:  [id number]  MEDICAL RECORD NO.:  [id number]  LOCATION:                                 FACILITY:  PHYSICIAN:  Thomas [name]. Charlann [name], M.D.       DATE OF BIRTH:  DATE OF PROCEDURE:  06/12/2017 DATE OF DISCHARGE:                              OPERATIVE REPORT   PREOPERATIVE DIAGNOSIS:  Left medial proximal tibial periprosthetic fracture following partial knee arthroplasty with polyethylene dislocation.  POSTOPERATIVE DIAGNOSIS:  Left medial proximal tibial periprosthetic fracture following partial knee arthroplasty with polyethylene dislocation.  PROCEDURES: 1. Open reduction and internal fixation of the left medial proximal     tibial plateau fracture. 2. Revision left partial knee arthroplasty, changing of the     polyethylene to a size 9 insert to match the small femur.  SURGEON:  Thomas [name]. Charlann [name], M.D.  ASSISTANT:  Thomas [name], Thomas [name].  Note, Thomas [name] was present for the entirety of the case from preoperative position, perioperative management of the operative extremity, general facilitation of the case, and primary wound closure.  ANESTHESIA:  Spinal.  SPECIMENS:  None.  COMPLICATIONS:  None.  TOURNIQUET TIME:  31 minutes at 250 mmHg.  BLOOD LOSS:  Less than 100 mL.  INDICATION OF THE PROCEDURE:  Thomas [name] is a 47 year old male who is now a little over 3 months from a left partial knee arthroplasty.  In his routine office visits, he had been apparently making some progress in overall recovery, gaining motion and strength, however, he has had some instances of instability.  Unfortunately, this past weekend, he had a twisting episode in the house, felt his knee give and fell.  He had immediate onset of pain, which led to evaluation at the emergency room. Radiographs there reveals a periprosthetic fracture with concern for polyethylene dislodgement.  He was subsequently seen in the office with Thomas [name] on Monday.  Upon  review of his radiographs, we asked that he be n.p.o. after midnight and be admitted to the hospital for today's procedure.  Risks, benefits, and necessity of the procedure were discussed and reviewed.  Consent was obtained for benefit of pain relief.  PROCEDURE IN DETAIL:  The patient was brought to the operative theater. Once adequate anesthesia, preoperative antibiotics, Ancef administered as well as Decadron.  He was positioned supine with a left thigh tourniquet placed.  The left lower extremity was then prepped and draped in sterile fashion.  Time-out was performed, identifying the patient, planned procedure, and extremity.  The leg was exsanguinated. Tourniquet elevated to 250 mmHg.  I utilized his old incision and extended distally for lateral exposure of the proximal tibia.  Soft tissue exposure was obtained followed by partial medial arthrotomy. Here, we encountered acute hemarthrosis consistent with this periprosthetic fracture.  This was evacuated.  The old polyethylene was removed.  The fracture line was evident.  There was no significant displacement despite the fact that there was a bit of settling as was evident by the fact that the old polyethylene size 5 was extremely loose.  At this point, I exposed the proximal medial tibia by elevating off  the periosteum.  We then selected a 3-hole Meta plate from [employer].  I used this plate and placed it over the proximal medial tibia to add to the buttress and further support for the fracture site.  I placed 3 bicortical screws into the distal segment of the plate and then a single screw in the proximal aspect.  This provided ample and adequate support for buttress purposes given the fact that I will have him limited weightbearing for at least 6 to 8 weeks.  Once this was completed, we irrigated the knee with normal saline solution, pulse lavaged about a liter.  I then trialed and ended up selecting a size 9 insert,  which I felt provided his stability in this situation.  Unfortunately, due to the extensive exposure necessary which was somewhat __________ partial knee design, it was a little bit difficult to ascertain the true stability of the knee.  Nonetheless, I felt this is the best option for him given his complications.  The final size 9 insert for the left knee was opened to match the size small femur.  It was snapped into place.  We re-irrigated.  At this point, the extensor mechanism was reapproximated with the knee in about 40 degrees of flexion using #1 Vicryl and #1 Stratafix suture.  The remainder of the wound was closed with 2-0 Vicryl in a running Monocryl stitch.  The skin was cleaned, dried, and dressed sterilely using surgical glue and Aquacel dressing.  The knee was wrapped in Ace wrap. He was placed back in a knee immobilizer.  At this point, I am going to keep the knee in the knee immobilizer for the first 2 weeks to allow things to settle down first.  He will be nonweightbearing for at least 6 to 8 weeks.  Physical Therapy will assess his function based on restrictions in the morning prior to potential discharge.     Thomas FrankelMatthew D. Charlann Boxerlin, M.D.     MDO/MEDQ  D:  06/12/2017  T:  06/13/2017  Job:  [id number]

## 2017-06-13 NOTE — Evaluation (Signed)
Physical Therapy One Time Evaluation and Discharge Patient Details Name: Thomas [name] MRN: [id number] DOB: [birth year] Today's Date: 06/13/2017   History of Present Illness  Pt is a 47 year old male s/p Left open reduction internal fixation medial tibial plateau fracture with poly revision medial compartmental arthroplasty   Clinical Impression  Patient evaluated by Physical Therapy with no further acute PT needs identified. All education has been completed and the patient has no further questions. Pt educated on KI and NWB status.  Pt with better ability to maintain NWB and improved steadiness using RW vs crutches.  Pt would like script for RW to take to [place]; states he has an old RW he can use at home in the meantime.  Pt states spouse and family can assist upon d/c.  Recommend f/u PT per surgeon upon pt's post op office visit. PT is signing off. Thank you for this referral.     Follow Up Recommendations No PT follow up(f/u PT per surgeon upon post op f/u visit)    Equipment Recommendations  Rolling walker with 5" wheels(pt would like script for RW to take to [place])    Recommendations for Other Services       Precautions / Restrictions Precautions Precautions: Fall Required Braces or Orthoses: Knee Immobilizer - Left Knee Immobilizer - Left: On at all times Restrictions Weight Bearing Restrictions: Yes LLE Weight Bearing: Non weight bearing      Mobility  Bed Mobility Overal bed mobility: Modified Independent                Transfers Overall transfer level: Needs assistance Equipment used: Crutches;Rolling walker (2 wheeled) Transfers: Sit to/from Stand Sit to Stand: Min guard         General transfer comment: min/guard for safety, pt shown how to safely use crutches with sit to stands, cues for hand placement with RW  Ambulation/Gait Ambulation/Gait assistance: Min guard Ambulation Distance (Feet): 50 Feet Assistive device: Rolling walker (2 wheeled)        General Gait Details: initially started with crutches however pt with poor ability to maintain NWB so provided RW and much improved steadiness and maintaining NWB status with RW observed  Stairs Stairs: Yes Stairs assistance: Min guard Stair Management: Step to pattern;Backwards;With walker Number of Stairs: 2 General stair comments: verbal cues for safety with RW and maintaining NWB status with steps, pt reports using crutches prior to arrival, emphasized safety first and maintaining NWB status with steps as pt plans to use crutches; agreeable to have assist for safety upon d/c home  Wheelchair Mobility    Modified Rankin (Stroke Patients Only)       Balance                                             Pertinent Vitals/Pain Pain Assessment: 0-10 Pain Score: 4  Pain Location: L leg Pain Descriptors / Indicators: Aching;Sore Pain Intervention(s): Repositioned;Limited activity within patient's tolerance;Monitored during session;Ice applied    Home Living Family/patient expects to be discharged to:: Private residence Living Arrangements: Spouse/significant other Available Help at Discharge: Family Type of Home: House Home Access: Stairs to enter Entrance Stairs-Rails: None Entrance Stairs-Number of Steps: 2-3 Home Layout: One level Home Equipment: Environmental consultant - 2 wheels;Crutches(reports old RW)      Prior Function Level of Independence: Independent with assistive device(s)  Comments: was using crutches and KI since Saturday up until surgery     Hand Dominance        Extremity/Trunk Assessment        Lower Extremity Assessment Lower Extremity Assessment: LLE deficits/detail LLE Deficits / Details: able to perform ankle pumps, maintained KI       Communication   Communication: No difficulties  Cognition Arousal/Alertness: Awake/alert Behavior During Therapy: WFL for tasks assessed/performed Overall Cognitive Status: Within Functional  Limits for tasks assessed                                        General Comments      Exercises     Assessment/Plan    PT Assessment Patent does not need any further PT services  PT Problem List Decreased strength;Decreased range of motion;Decreased mobility;Decreased knowledge of use of DME;Pain;Decreased knowledge of precautions       PT Treatment Interventions      PT Goals (Current goals can be found in the Care Plan section)  Acute Rehab PT Goals PT Goal Formulation: All assessment and education complete, DC therapy    Frequency     Barriers to discharge        Co-evaluation               AM-PAC PT "6 Clicks" Daily Activity  Outcome Measure Difficulty turning over in bed (including adjusting bedclothes, sheets and blankets)?: None Difficulty moving from lying on back to sitting on the side of the bed? : A Little Difficulty sitting down on and standing up from a chair with arms (e.g., wheelchair, bedside commode, etc,.)?: A Lot Help needed moving to and from a bed to chair (including a wheelchair)?: A Little Help needed walking in hospital room?: A Little Help needed climbing 3-5 steps with a railing? : A Little 6 Click Score: 18    End of Session Equipment Utilized During Treatment: Gait belt;Left knee immobilizer Activity Tolerance: Patient tolerated treatment well Patient left: in chair;with call bell/phone within reach Nurse Communication: Mobility status PT Visit Diagnosis: Difficulty in walking, not elsewhere classified (R26.2)    Time: [id number] PT Time Calculation (min) (ACUTE ONLY): 17 min   Charges:   PT Evaluation $PT Eval Low Complexity: 1 Low     PT G CodesZenovia Jarred:       Kati Oakley Orban, PT, DPT 06/13/2017 Pager: [phone]  Maida SaleLEMYRE,KATHrine E 06/13/2017, 11:24 AM

## 2017-06-20 NOTE — Discharge Summary (Signed)
Physician Discharge Summary  Patient ID: Thomas [name] MRN: [id number] DOB/AGE: 1970/05/13 47 y.o.  Admit date: 06/12/2017 Discharge date: 06/13/2017   Procedures:  Procedure(s) (LRB): Left open reduction internal fixation medial tibial plateau fracture with poly revision medial compartmental arthroplasty (Left)  Attending Physician:  Dr. Durene RomansMatthew Olin   Admission Diagnoses:   Left medial tibial plateau fracture with dislocation of poly from UKR  Discharge Diagnoses:  Principal Problem:   Tibial plateau fracture, left  Past Medical History:  Diagnosis Date  . Arthritis   . Chest pain   . GERD (gastroesophageal reflux disease)   . HTN (hypertension)   . Pre-diabetes   . Sleep apnea    cpap   . SVT (supraventricular tachycardia) (HCC)     HPI:    Pt is a 47 y.o. male complaining of left knee pain after sustaining a fall.  He presented to the ER where he was diagnosed with a left medial tibial plateau fracture with dislocation of poly from UKR. He was place in a KI made NWB and told to follow up in the clinic.  In the clinic he was seen and determined that he would need to have surgery.  It was discussed that he would need a ORIF left medial tibial plateau fracture with poly revision of medial UKR.   Various options are discussed with the patient. Risks, benefits and expectations were discussed with the patient. Patient understand the risks, benefits and expectations and wishes to proceed with surgery.   PCP: Sondra ComeWoods, Samuel, MD   Discharged Condition: good  Hospital Course:  Patient underwent the above stated procedure on 06/12/2017. Patient tolerated the procedure well and brought to the recovery room in good condition and subsequently to the floor.  POD #1 BP: 144/91 ; Pulse: 108 ; Temp: 97.8 F (36.6 C) ; Resp: 19 Patient describes his pain as mild, pain controlled well with medications.  No events throughout the night.  Patient is frustrated with the whole incident as he feels  he was doing well prior to the fall.  States that he understands his restrictions.  Ready to be discharged home. Dorsiflexion/plantar flexion intact, incision: dressing C/D/I, no cellulitis present and compartment soft.   LABS  Basename    HGB     13.6  HCT     39.6    Discharge Exam: General appearance: alert, cooperative and no distress Extremities: Homans sign is negative, no sign of DVT, no edema, redness or tenderness in the calves or thighs and no ulcers, gangrene or trophic changes  Disposition:  Home with follow up in 2 weeks   Follow-up Information    Durene Romanslin, Xayvier Vallez, MD. Schedule an appointment as soon as possible for a visit in 2 weeks.   Specialty:  Orthopedic Surgery Contact information: [address] [place] [address] [phone]           Discharge Instructions    Call MD / Call 911   Complete by:  As directed    If you experience chest pain or shortness of breath, CALL 911 and be transported to the hospital emergency room.  If you develope a fever above 101 F, pus (white drainage) or increased drainage or redness at the wound, or calf pain, call your surgeon's office.   Change dressing   Complete by:  As directed    Maintain surgical dressing until follow up in the clinic. If the edges start to pull up, may reinforce with tape. If the dressing  is no longer working, may remove and cover with gauze and tape, but must keep the area dry and clean.  Call with any questions or concerns.   Constipation Prevention   Complete by:  As directed    Drink plenty of fluids.  Prune juice may be helpful.  You may use a stool softener, such as Colace (over the counter) 100 mg twice a day.  Use MiraLax (over the counter) for constipation as needed.   Diet - low sodium heart healthy   Complete by:  As directed    Discharge instructions   Complete by:  As directed    Maintain surgical dressing until follow up in the clinic. If the edges start to pull up, may  reinforce with tape. If the dressing is no longer working, may remove and cover with gauze and tape, but must keep the area dry and clean.  [facility]. Call with any questions or concerns.   Non weight bearing   Complete by:  As directed    Laterality:  left   Extremity:  Lower   TED hose   Complete by:  As directed    Use stockings (TED hose) for 2 weeks on both leg(s).  You may remove them at night for sleeping.      Allergies as of 06/13/2017   No Known Allergies     Medication List    STOP taking these medications   oxyCODONE 5 MG immediate release tablet Commonly known as:  Oxy IR/ROXICODONE   traMADol 50 MG tablet Commonly known as:  ULTRAM     TAKE these medications   acetaminophen 500 MG tablet Commonly known as:  TYLENOL Take 2 tablets (1,000 mg total) by mouth every 8 (eight) hours.   aspirin 325 MG EC tablet Take 1 tablet (325 mg total) by mouth 2 (two) times daily.   docusate sodium 100 MG capsule Commonly known as:  COLACE Take 1 capsule (100 mg total) by mouth 2 (two) times daily.   enalapril 20 MG tablet Commonly known as:  VASOTEC Take 20 mg by mouth daily.   escitalopram 20 MG tablet Commonly known as:  LEXAPRO Take 20 mg by mouth daily.   ferrous sulfate 325 (65 FE) MG tablet Commonly known as:  FERROUSUL Take 1 tablet (325 mg total) by mouth 3 (three) times daily with meals.   hydrOXYzine 25 MG tablet Commonly known as:  ATARAX/VISTARIL Take 25 mg by mouth 3 (three) times daily as needed.   methocarbamol 500 MG tablet Commonly known as:  ROBAXIN Take 1 tablet (500 mg total) by mouth every 6 (six) hours as needed for muscle spasms.   metoprolol tartrate 100 MG tablet Commonly known as:  LOPRESSOR Take 100 mg by mouth 2 (two) times daily.   multivitamin tablet Take 1 tablet by mouth daily.   omeprazole 20 MG capsule Commonly known as:  PRILOSEC Take 20 mg by mouth daily.   polyethylene glycol  packet Commonly known as:  MIRALAX / GLYCOLAX Take 17 g by mouth 2 (two) times daily.   pregabalin 200 MG capsule Commonly known as:  LYRICA Take 200 mg by mouth 3 (three) times daily.   traZODone 100 MG tablet Commonly known as:  DESYREL Take 200 mg by mouth at bedtime.   verapamil 120 MG tablet Commonly known as:  CALAN Take 120 mg by mouth daily.     ASK your doctor about these medications   oxyCODONE 5  MG immediate release tablet Commonly known as:  Oxy IR/ROXICODONE Take 1-2 tablets (5-10 mg total) by mouth every 4 (four) hours as needed for up to 5 days for severe pain. Ask about: Should I take this medication?            Discharge Care Instructions  (From admission, onward)        Start     Ordered   06/13/17 0000  Change dressing    Comments:  Maintain surgical dressing until follow up in the clinic. If the edges start to pull up, may reinforce with tape. If the dressing is no longer working, may remove and cover with gauze and tape, but must keep the area dry and clean.  Call with any questions or concerns.   06/13/17 0843   06/13/17 0000  Non weight bearing    Question Answer Comment  Laterality left   Extremity Lower      06/13/17 0843       Signed: Anastasio Auerbach. Sanjeev Main   PA-C  06/20/2017, 10:04 AM

## 2017-07-16 ENCOUNTER — Observation Stay (HOSPITAL_COMMUNITY): Payer: No Typology Code available for payment source | Admitting: Certified Registered Nurse Anesthetist

## 2017-07-16 ENCOUNTER — Encounter (HOSPITAL_COMMUNITY): Payer: Self-pay | Admitting: Orthopedic Surgery

## 2017-07-16 ENCOUNTER — Inpatient Hospital Stay: Admit: 2017-07-16 | Payer: Non-veteran care | Admitting: Orthopedic Surgery

## 2017-07-16 ENCOUNTER — Other Ambulatory Visit: Payer: Self-pay

## 2017-07-16 ENCOUNTER — Inpatient Hospital Stay (HOSPITAL_COMMUNITY)
Admission: AD | Admit: 2017-07-16 | Discharge: 2017-07-17 | DRG: 489 | Disposition: A | Discharge status: Home / Self Care | Payer: No Typology Code available for payment source | Attending: Orthopedic Surgery | Admitting: Orthopedic Surgery

## 2017-07-16 ENCOUNTER — Encounter (HOSPITAL_COMMUNITY)
Admission: AD | Disposition: A | Discharge status: Home / Self Care | Payer: Self-pay | Source: Home / Self Care | Attending: Orthopedic Surgery

## 2017-07-16 DIAGNOSIS — K219 Gastro-esophageal reflux disease without esophagitis: Secondary | ICD-10-CM | POA: Diagnosis present

## 2017-07-16 DIAGNOSIS — I10 Essential (primary) hypertension: Secondary | ICD-10-CM | POA: Diagnosis present

## 2017-07-16 DIAGNOSIS — G4730 Sleep apnea, unspecified: Secondary | ICD-10-CM | POA: Diagnosis present

## 2017-07-16 DIAGNOSIS — F17290 Nicotine dependence, other tobacco product, uncomplicated: Secondary | ICD-10-CM | POA: Diagnosis present

## 2017-07-16 DIAGNOSIS — R7303 Prediabetes: Secondary | ICD-10-CM | POA: Diagnosis present

## 2017-07-16 DIAGNOSIS — Z96652 Presence of left artificial knee joint: Secondary | ICD-10-CM

## 2017-07-16 DIAGNOSIS — T84023A Instability of internal left knee prosthesis, initial encounter: Principal | ICD-10-CM | POA: Diagnosis present

## 2017-07-16 DIAGNOSIS — M25562 Pain in left knee: Secondary | ICD-10-CM | POA: Diagnosis present

## 2017-07-16 HISTORY — PX: I&D KNEE WITH POLY EXCHANGE: SHX5024

## 2017-07-16 LAB — SURGICAL PCR SCREEN
MRSA, PCR: NEGATIVE
Staphylococcus aureus: POSITIVE — AB

## 2017-07-16 LAB — CBC
HEMATOCRIT: 40.8 % (ref 39.0–52.0)
HEMOGLOBIN: 13.6 g/dL (ref 13.0–17.0)
MCH: 28.7 pg (ref 26.0–34.0)
MCHC: 33.3 g/dL (ref 30.0–36.0)
MCV: 86.1 fL (ref 78.0–100.0)
Platelets: 112 10*3/uL — ABNORMAL LOW (ref 150–400)
RBC: 4.74 MIL/uL (ref 4.22–5.81)
RDW: 14.7 % (ref 11.5–15.5)
WBC: 5.4 10*3/uL (ref 4.0–10.5)

## 2017-07-16 LAB — TYPE AND SCREEN
ABO/RH(D): A POS
ANTIBODY SCREEN: NEGATIVE

## 2017-07-16 SURGERY — IRRIGATION AND DEBRIDEMENT KNEE WITH POLY EXCHANGE
Anesthesia: Spinal | Site: Knee | Laterality: Left

## 2017-07-16 MED ORDER — FENTANYL CITRATE (PF) 100 MCG/2ML IJ SOLN
INTRAMUSCULAR | Status: AC
  Filled 2017-07-16: qty 2

## 2017-07-16 MED ORDER — ONDANSETRON HCL 4 MG/2ML IJ SOLN: 4.0000 mg | Freq: Four times a day (QID) | INTRAMUSCULAR | Status: DC | PRN

## 2017-07-16 MED ORDER — METOCLOPRAMIDE HCL 5 MG PO TABS: 5.0000 mg | ORAL_TABLET | Freq: Three times a day (TID) | ORAL | Status: DC | PRN

## 2017-07-16 MED ORDER — ONDANSETRON HCL 4 MG/2ML IJ SOLN
INTRAMUSCULAR | Status: DC | PRN
  Administered 2017-07-16: 4 mg via INTRAVENOUS

## 2017-07-16 MED ORDER — PROPOFOL 10 MG/ML IV BOLUS
INTRAVENOUS | Status: DC | PRN
  Administered 2017-07-16: 40 mg via INTRAVENOUS

## 2017-07-16 MED ORDER — DEXAMETHASONE SODIUM PHOSPHATE 10 MG/ML IJ SOLN
10.0000 mg | Freq: Once | INTRAMUSCULAR | Status: AC
  Administered 2017-07-16: 4 mg via INTRAVENOUS

## 2017-07-16 MED ORDER — DEXAMETHASONE SODIUM PHOSPHATE 10 MG/ML IJ SOLN
INTRAMUSCULAR | Status: AC
  Filled 2017-07-16: qty 1

## 2017-07-16 MED ORDER — MEPERIDINE HCL 50 MG/ML IJ SOLN: 6.2500 mg | INTRAMUSCULAR | Status: DC | PRN

## 2017-07-16 MED ORDER — METOCLOPRAMIDE HCL 5 MG/ML IJ SOLN: 5.0000 mg | Freq: Three times a day (TID) | INTRAMUSCULAR | Status: DC | PRN

## 2017-07-16 MED ORDER — CEFAZOLIN SODIUM-DEXTROSE 2-4 GM/100ML-% IV SOLN
2.0000 g | Freq: Four times a day (QID) | INTRAVENOUS | Status: AC
  Administered 2017-07-16 – 2017-07-17 (×2): 2 g via INTRAVENOUS
  Filled 2017-07-16 (×2): qty 100

## 2017-07-16 MED ORDER — METHOCARBAMOL 1000 MG/10ML IJ SOLN
500.0000 mg | Freq: Four times a day (QID) | INTRAMUSCULAR | Status: DC | PRN
  Filled 2017-07-16: qty 5

## 2017-07-16 MED ORDER — NON FORMULARY: 20.0000 mg | Freq: Every day | Status: DC

## 2017-07-16 MED ORDER — OXYCODONE HCL 5 MG PO TABS
5.0000 mg | ORAL_TABLET | ORAL | Status: DC | PRN
  Administered 2017-07-16 – 2017-07-17 (×3): 10 mg via ORAL
  Filled 2017-07-16 (×3): qty 2

## 2017-07-16 MED ORDER — ROPIVACAINE HCL 7.5 MG/ML IJ SOLN
INTRAMUSCULAR | Status: DC | PRN
  Administered 2017-07-16: 20 mL via PERINEURAL

## 2017-07-16 MED ORDER — KETOROLAC TROMETHAMINE 30 MG/ML IJ SOLN
INTRAMUSCULAR | Status: AC
  Filled 2017-07-16: qty 1

## 2017-07-16 MED ORDER — DEXTROSE 5 % IV SOLN
3.0000 g | INTRAVENOUS | Status: AC
  Administered 2017-07-16: 3 g via INTRAVENOUS
  Filled 2017-07-16: qty 3

## 2017-07-16 MED ORDER — PROPOFOL 500 MG/50ML IV EMUL
INTRAVENOUS | Status: DC | PRN
  Administered 2017-07-16: 50 ug/kg/min via INTRAVENOUS

## 2017-07-16 MED ORDER — PROPOFOL 10 MG/ML IV BOLUS
INTRAVENOUS | Status: AC
  Filled 2017-07-16: qty 60

## 2017-07-16 MED ORDER — OXYCODONE HCL 5 MG/5ML PO SOLN
5.0000 mg | Freq: Once | ORAL | Status: DC | PRN
  Filled 2017-07-16: qty 5

## 2017-07-16 MED ORDER — LIDOCAINE HCL URETHRAL/MUCOSAL 2 % EX GEL
CUTANEOUS | Status: AC
  Filled 2017-07-16: qty 5

## 2017-07-16 MED ORDER — BUPIVACAINE HCL (PF) 0.25 % IJ SOLN
INTRAMUSCULAR | Status: AC
  Filled 2017-07-16: qty 30

## 2017-07-16 MED ORDER — DEXAMETHASONE SODIUM PHOSPHATE 10 MG/ML IJ SOLN: 10.0000 mg | Freq: Once | INTRAMUSCULAR | Status: DC

## 2017-07-16 MED ORDER — MIDAZOLAM HCL 2 MG/2ML IJ SOLN
2.0000 mg | Freq: Once | INTRAMUSCULAR | Status: AC
  Administered 2017-07-16: 2 mg via INTRAVENOUS

## 2017-07-16 MED ORDER — HYDROMORPHONE HCL 1 MG/ML IJ SOLN: 0.5000 mg | INTRAMUSCULAR | Status: DC | PRN

## 2017-07-16 MED ORDER — PREGABALIN 100 MG PO CAPS
300.0000 mg | ORAL_CAPSULE | Freq: Two times a day (BID) | ORAL | Status: DC
  Administered 2017-07-16 – 2017-07-17 (×2): 300 mg via ORAL
  Filled 2017-07-16 (×2): qty 3

## 2017-07-16 MED ORDER — ONDANSETRON HCL 4 MG/2ML IJ SOLN: 4.0000 mg | Freq: Once | INTRAMUSCULAR | Status: DC | PRN

## 2017-07-16 MED ORDER — METHOCARBAMOL 500 MG PO TABS
500.0000 mg | ORAL_TABLET | Freq: Four times a day (QID) | ORAL | Status: DC | PRN
  Administered 2017-07-17: 500 mg via ORAL
  Filled 2017-07-16: qty 1

## 2017-07-16 MED ORDER — MIDAZOLAM HCL 2 MG/2ML IJ SOLN
INTRAMUSCULAR | Status: AC
  Filled 2017-07-16: qty 2

## 2017-07-16 MED ORDER — TRAZODONE HCL 100 MG PO TABS
200.0000 mg | ORAL_TABLET | Freq: Every day | ORAL | Status: DC
  Administered 2017-07-16: 200 mg via ORAL
  Filled 2017-07-16: qty 2

## 2017-07-16 MED ORDER — PHENOL 1.4 % MT LIQD: 1.0000 | OROMUCOSAL | Status: DC | PRN

## 2017-07-16 MED ORDER — BISACODYL 10 MG RE SUPP: 10.0000 mg | Freq: Every day | RECTAL | Status: DC | PRN

## 2017-07-16 MED ORDER — HYDROMORPHONE HCL 1 MG/ML IJ SOLN
0.5000 mg | INTRAMUSCULAR | Status: DC | PRN
  Administered 2017-07-16: 0.5 mg via INTRAVENOUS
  Filled 2017-07-16: qty 0.5

## 2017-07-16 MED ORDER — MAGNESIUM CITRATE PO SOLN: 1.0000 | Freq: Once | ORAL | Status: DC | PRN

## 2017-07-16 MED ORDER — DOCUSATE SODIUM 100 MG PO CAPS
100.0000 mg | ORAL_CAPSULE | Freq: Two times a day (BID) | ORAL | Status: DC
  Administered 2017-07-16 – 2017-07-17 (×2): 100 mg via ORAL
  Filled 2017-07-16 (×2): qty 1

## 2017-07-16 MED ORDER — HYDROMORPHONE HCL 1 MG/ML IJ SOLN: 0.2500 mg | INTRAMUSCULAR | Status: DC | PRN

## 2017-07-16 MED ORDER — CELECOXIB 200 MG PO CAPS
200.0000 mg | ORAL_CAPSULE | Freq: Two times a day (BID) | ORAL | Status: DC
  Administered 2017-07-16 – 2017-07-17 (×2): 200 mg via ORAL
  Filled 2017-07-16 (×2): qty 1

## 2017-07-16 MED ORDER — FENTANYL CITRATE (PF) 100 MCG/2ML IJ SOLN
100.0000 ug | Freq: Once | INTRAMUSCULAR | Status: AC
  Administered 2017-07-16: 100 ug via INTRAVENOUS

## 2017-07-16 MED ORDER — SODIUM CHLORIDE 0.9 % IJ SOLN
INTRAMUSCULAR | Status: DC | PRN
  Administered 2017-07-16: 29 mL

## 2017-07-16 MED ORDER — MENTHOL 3 MG MT LOZG: 1.0000 | LOZENGE | OROMUCOSAL | Status: DC | PRN

## 2017-07-16 MED ORDER — METOPROLOL TARTRATE 50 MG PO TABS
100.0000 mg | ORAL_TABLET | Freq: Two times a day (BID) | ORAL | Status: DC
  Administered 2017-07-16 – 2017-07-17 (×2): 100 mg via ORAL
  Filled 2017-07-16 (×2): qty 2

## 2017-07-16 MED ORDER — ACETAMINOPHEN 325 MG PO TABS: 325.0000 mg | ORAL_TABLET | Freq: Four times a day (QID) | ORAL | Status: DC | PRN

## 2017-07-16 MED ORDER — LACTATED RINGERS IV SOLN
INTRAVENOUS | Status: DC
  Administered 2017-07-16 (×2): via INTRAVENOUS

## 2017-07-16 MED ORDER — OXYCODONE HCL 5 MG PO TABS: 5.0000 mg | ORAL_TABLET | Freq: Once | ORAL | Status: DC | PRN

## 2017-07-16 MED ORDER — 0.9 % SODIUM CHLORIDE (POUR BTL) OPTIME
TOPICAL | Status: DC | PRN
  Administered 2017-07-16: 1000 mL

## 2017-07-16 MED ORDER — TRANEXAMIC ACID 1000 MG/10ML IV SOLN
1000.0000 mg | INTRAVENOUS | Status: AC
  Administered 2017-07-16: 1000 mg via INTRAVENOUS
  Filled 2017-07-16: qty 10
  Filled 2017-07-16: qty 1100

## 2017-07-16 MED ORDER — SODIUM CHLORIDE 0.9 % IV SOLN
INTRAVENOUS | Status: DC
  Administered 2017-07-16: 50 mL/h via INTRAVENOUS

## 2017-07-16 MED ORDER — ONDANSETRON HCL 4 MG/2ML IJ SOLN
INTRAMUSCULAR | Status: AC
  Filled 2017-07-16: qty 2

## 2017-07-16 MED ORDER — ONDANSETRON HCL 4 MG PO TABS: 4.0000 mg | ORAL_TABLET | Freq: Four times a day (QID) | ORAL | Status: DC | PRN

## 2017-07-16 MED ORDER — DIPHENHYDRAMINE HCL 12.5 MG/5ML PO ELIX: 12.5000 mg | ORAL_SOLUTION | ORAL | Status: DC | PRN

## 2017-07-16 MED ORDER — SODIUM CHLORIDE 0.9 % IJ SOLN
INTRAMUSCULAR | Status: AC
  Filled 2017-07-16: qty 50

## 2017-07-16 MED ORDER — FERROUS SULFATE 325 (65 FE) MG PO TABS
325.0000 mg | ORAL_TABLET | Freq: Three times a day (TID) | ORAL | Status: DC
  Administered 2017-07-17 (×2): 325 mg via ORAL
  Filled 2017-07-16 (×2): qty 1

## 2017-07-16 MED ORDER — OMEPRAZOLE 20 MG PO CPDR
20.0000 mg | DELAYED_RELEASE_CAPSULE | Freq: Every day | ORAL | Status: DC
  Administered 2017-07-17: 20 mg via ORAL
  Filled 2017-07-16: qty 1

## 2017-07-16 MED ORDER — SODIUM CHLORIDE 0.9 % IV SOLN
INTRAVENOUS | Status: AC | PRN
  Administered 2017-07-16: 1000 mL

## 2017-07-16 MED ORDER — ALUM & MAG HYDROXIDE-SIMETH 200-200-20 MG/5ML PO SUSP: 15.0000 mL | ORAL | Status: DC | PRN

## 2017-07-16 MED ORDER — CHLORHEXIDINE GLUCONATE 4 % EX LIQD
60.0000 mL | Freq: Once | CUTANEOUS | Status: AC
  Administered 2017-07-16: 4 via TOPICAL
  Filled 2017-07-16: qty 15

## 2017-07-16 MED ORDER — BUPIVACAINE-EPINEPHRINE (PF) 0.25% -1:200000 IJ SOLN
INTRAMUSCULAR | Status: DC | PRN
  Administered 2017-07-16: 30 mL

## 2017-07-16 MED ORDER — OXYCODONE HCL 5 MG PO TABS
10.0000 mg | ORAL_TABLET | ORAL | Status: DC | PRN
  Administered 2017-07-17: 10 mg via ORAL
  Filled 2017-07-16: qty 3

## 2017-07-16 MED ORDER — VERAPAMIL HCL ER 120 MG PO TBCR
120.0000 mg | EXTENDED_RELEASE_TABLET | Freq: Every day | ORAL | Status: DC
  Administered 2017-07-16: 120 mg via ORAL
  Filled 2017-07-16: qty 1

## 2017-07-16 MED ORDER — SODIUM CHLORIDE 0.9 % IV SOLN
INTRAVENOUS | Status: DC
  Administered 2017-07-16: 20:00:00 via INTRAVENOUS

## 2017-07-16 MED ORDER — TRANEXAMIC ACID 1000 MG/10ML IV SOLN
1000.0000 mg | Freq: Once | INTRAVENOUS | Status: AC
  Administered 2017-07-16: 1000 mg via INTRAVENOUS
  Filled 2017-07-16: qty 1100

## 2017-07-16 MED ORDER — CEFAZOLIN SODIUM-DEXTROSE 2-4 GM/100ML-% IV SOLN
2.0000 g | INTRAVENOUS | Status: DC
  Filled 2017-07-16: qty 100

## 2017-07-16 MED ORDER — POLYETHYLENE GLYCOL 3350 17 G PO PACK
17.0000 g | PACK | Freq: Two times a day (BID) | ORAL | Status: DC
  Administered 2017-07-16 – 2017-07-17 (×2): 17 g via ORAL
  Filled 2017-07-16 (×2): qty 1

## 2017-07-16 MED ORDER — HYDROXYZINE HCL 25 MG PO TABS: 25.0000 mg | ORAL_TABLET | Freq: Three times a day (TID) | ORAL | Status: DC | PRN

## 2017-07-16 MED ORDER — ESCITALOPRAM OXALATE 20 MG PO TABS
20.0000 mg | ORAL_TABLET | Freq: Every day | ORAL | Status: DC
  Administered 2017-07-17: 20 mg via ORAL
  Filled 2017-07-16: qty 1

## 2017-07-16 MED ORDER — ASPIRIN 81 MG PO CHEW
81.0000 mg | CHEWABLE_TABLET | Freq: Two times a day (BID) | ORAL | Status: DC
  Administered 2017-07-16 – 2017-07-17 (×2): 81 mg via ORAL
  Filled 2017-07-16 (×2): qty 1

## 2017-07-16 MED ORDER — KETOROLAC TROMETHAMINE 30 MG/ML IJ SOLN
INTRAMUSCULAR | Status: DC | PRN
  Administered 2017-07-16: 30 mg

## 2017-07-16 SURGICAL SUPPLY — 50 items
BAG ZIPLOCK 12X15 (MISCELLANEOUS) ×3 IMPLANT
BANDAGE ACE 4X5 VEL STRL LF (GAUZE/BANDAGES/DRESSINGS) IMPLANT
BANDAGE ACE 6X5 VEL STRL LF (GAUZE/BANDAGES/DRESSINGS) ×3 IMPLANT
BLADE SAW SGTL 11.0X1.19X90.0M (BLADE) IMPLANT
CLOTH BEACON ORANGE TIMEOUT ST (SAFETY) IMPLANT
COVER SURGICAL LIGHT HANDLE (MISCELLANEOUS) ×3 IMPLANT
CUFF TOURN SGL QUICK 34 (TOURNIQUET CUFF) ×2
CUFF TRNQT CYL 34X4X40X1 (TOURNIQUET CUFF) ×1 IMPLANT
DECANTER SPIKE VIAL GLASS SM (MISCELLANEOUS) ×3 IMPLANT
DERMABOND ADVANCED (GAUZE/BANDAGES/DRESSINGS) ×4
DERMABOND ADVANCED .7 DNX12 (GAUZE/BANDAGES/DRESSINGS) ×2 IMPLANT
DRAPE U-SHAPE 47X51 STRL (DRAPES) ×3 IMPLANT
DRSG AQUACEL AG ADV 3.5X10 (GAUZE/BANDAGES/DRESSINGS) ×3 IMPLANT
DRSG AQUACEL AG ADV 3.5X14 (GAUZE/BANDAGES/DRESSINGS) IMPLANT
DURAPREP 26ML APPLICATOR (WOUND CARE) ×6 IMPLANT
ELECT REM PT RETURN 15FT ADLT (MISCELLANEOUS) ×3 IMPLANT
GLOVE BIOGEL M 7.0 STRL (GLOVE) IMPLANT
GLOVE BIOGEL PI IND STRL 7.5 (GLOVE) ×1 IMPLANT
GLOVE BIOGEL PI IND STRL 8.5 (GLOVE) ×1 IMPLANT
GLOVE BIOGEL PI INDICATOR 7.5 (GLOVE) ×2
GLOVE BIOGEL PI INDICATOR 8.5 (GLOVE) ×2
GLOVE ECLIPSE 8.0 STRL XLNG CF (GLOVE) ×6 IMPLANT
GLOVE ORTHO TXT STRL SZ7.5 (GLOVE) ×6 IMPLANT
GOWN STRL REUS W/TWL LRG LVL3 (GOWN DISPOSABLE) ×3 IMPLANT
GOWN STRL REUS W/TWL XL LVL3 (GOWN DISPOSABLE) IMPLANT
HANDPIECE INTERPULSE COAX TIP (DISPOSABLE) ×2
IMMOBILIZER KNEE 20 (SOFTGOODS) ×3
IMMOBILIZER KNEE 20 THIGH 36 (SOFTGOODS) ×1 IMPLANT
MANIFOLD NEPTUNE II (INSTRUMENTS) ×3 IMPLANT
NDL SAFETY ECLIPSE 18X1.5 (NEEDLE) IMPLANT
NEEDLE HYPO 18GX1.5 SHARP (NEEDLE)
NS IRRIG 1000ML POUR BTL (IV SOLUTION) ×3 IMPLANT
PACK TOTAL KNEE CUSTOM (KITS) ×3 IMPLANT
POSITIONER SURGICAL ARM (MISCELLANEOUS) ×3 IMPLANT
SET HNDPC FAN SPRY TIP SCT (DISPOSABLE) ×1 IMPLANT
SPONGE LAP 18X18 RF (DISPOSABLE) IMPLANT
STAPLER VISISTAT 35W (STAPLE) IMPLANT
SUT MNCRL AB 3-0 PS2 18 (SUTURE) ×6 IMPLANT
SUT MNCRL AB 4-0 PS2 18 (SUTURE) ×3 IMPLANT
SUT STRATAFIX 0 PDS 27 VIOLET (SUTURE) ×3
SUT VIC AB 1 CT1 36 (SUTURE) ×6 IMPLANT
SUT VIC AB 2-0 CT1 27 (SUTURE) ×6
SUT VIC AB 2-0 CT1 TAPERPNT 27 (SUTURE) ×3 IMPLANT
SUTURE STRATFX 0 PDS 27 VIOLET (SUTURE) ×1 IMPLANT
SWAB COLLECTION DEVICE MRSA (MISCELLANEOUS) IMPLANT
SWAB CULTURE ESWAB REG 1ML (MISCELLANEOUS) IMPLANT
SYR 50ML LL SCALE MARK (SYRINGE) IMPLANT
TRAY FOLEY MTR SLVR 16FR STAT (SET/KITS/TRAYS/PACK) ×3 IMPLANT
WATER STERILE IRR 1000ML POUR (IV SOLUTION) IMPLANT
WRAP KNEE MAXI GEL POST OP (GAUZE/BANDAGES/DRESSINGS) ×3 IMPLANT

## 2017-07-16 NOTE — Interval H&P Note (Signed)
History and Physical Interval Note:  07/16/2017 3:28 PM  Thomas [name]  has presented today for surgery, with the diagnosis of left knee poly  The various methods of treatment have been discussed with the patient and family. After consideration of risks, benefits and other options for treatment, the patient has consented to  Procedure(s): LEFT KNEE WITH POLY EXCHANGE (Left) as a surgical intervention .  The patient's history has been reviewed, patient examined, no change in status, stable for surgery.  I have reviewed the patient's chart and labs.  Questions were answered to the patient's satisfaction.     [name]

## 2017-07-16 NOTE — Brief Op Note (Signed)
07/16/2017  5:59 PM  PATIENT:  Thomas [name]  47 y.o. male  PRE-OPERATIVE DIAGNOSIS:  Left knee polyethylene dislocation after partial knee replacement  POST-OPERATIVE DIAGNOSIS:   Left knee polyethylene dislocation after partial knee replacement  PROCEDURE:  Procedure(s) with comments: LEFT KNEE WITH POLY EXCHANGE (Left) - with block  SURGEON:  Surgeon(s) and Role:    Durene Romans, MD - Primary  PHYSICIAN ASSISTANT: Lanney Gins, PA-C  ANESTHESIA:   spinal  EBL:  100 mL   BLOOD ADMINISTERED:none  DRAINS: none   LOCAL MEDICATIONS USED:  MARCAINE     SPECIMEN:  No Specimen  DISPOSITION OF SPECIMEN:  N/A  COUNTS:  YES  TOURNIQUET:   Total Tourniquet Time Documented: Thigh (Left) - 27 minutes Total: Thigh (Left) - 27 minutes   DICTATION: .Other Dictation: Dictation Number [id number]  PLAN OF CARE: Admit for overnight observation  PATIENT DISPOSITION:  PACU - hemodynamically stable.   Delay start of Pharmacological VTE agent (>24hrs) due to surgical blood loss or risk of bleeding: no

## 2017-07-16 NOTE — Anesthesia Preprocedure Evaluation (Signed)
Anesthesia Evaluation  Patient identified by MRN, date of birth, ID band Patient awake    Reviewed: Allergy & Precautions, NPO status , Patient's Chart, lab work & pertinent test results, reviewed documented beta blocker date and time   Airway Mallampati: II  TM Distance: >3 FB Neck ROM: Full    Dental no notable dental hx. (+) Teeth Intact   Pulmonary sleep apnea and Continuous Positive Airway Pressure Ventilation ,    Pulmonary exam normal breath sounds clear to auscultation       Cardiovascular hypertension, Pt. on home beta blockers and Pt. on medications Normal cardiovascular exam Rhythm:Regular Rate:Normal     Neuro/Psych negative neurological ROS  negative psych ROS   GI/Hepatic GERD  Controlled and Medicated,  Endo/Other  Morbid obesityHyperlipidemia  Renal/GU negative Renal ROS  negative genitourinary   Musculoskeletal  (+) Arthritis , Osteoarthritis,  Mechanical complication of left knee prosthesis   Abdominal (+) + obese,   Peds  Hematology Thrombocytopenia   Anesthesia Other Findings   Reproductive/Obstetrics                             Anesthesia Physical Anesthesia Plan  ASA: III  Anesthesia Plan: Spinal   Post-op Pain Management:    Induction: Intravenous  PONV Risk Score and Plan: 2 and Propofol infusion, Ondansetron and Treatment may vary due to age or medical condition  Airway Management Planned: Natural Airway  Additional Equipment:   Intra-op Plan:   Post-operative Plan:   Informed Consent: I have reviewed the patients History and Physical, chart, labs and discussed the procedure including the risks, benefits and alternatives for the proposed anesthesia with the patient or authorized representative who has indicated his/her understanding and acceptance.   Dental advisory given  Plan Discussed with: CRNA, Anesthesiologist and Surgeon  Anesthesia Plan  Comments:         Anesthesia Quick Evaluation

## 2017-07-16 NOTE — H&P (Signed)
TOTAL KNEE REVISION ADMISSION H&P  Patient is being admitted for left revision total knee arthroplasty.  Subjective:  Chief Complaint:    Left knee dislocation of poly from UKR  Procedure: Left knee UKR poly revision   HPI: Thomas [name], 47 y.o. male, has a history of pain and functional disability in the left knee(s) due to failed previous arthroplasty, poly dislocation.  The indications for the revision of the total knee arthroplasty are poly dislocation. Onset of symptoms was abrupt starting this morning with stable course since that time. Patient was in his truck this morning when he moved his leg, felt a pop and was unable to fully move his leg.  Prior procedures on the left knee(s) include unicompartmental arthroplasty.  Patient currently rates pain in the left knee(s) at 10 out of 10 with activity. There is worsening of pain with activity and weight bearing, pain that interferes with activities of daily living, pain with passive range of motion, crepitus and joint swelling.  Patient has evidence of dislocated poly by imaging studies. This condition presents safety issues increasing the risk of falls.   There is no current active infection. Risks, benefits and expectations were discussed with the patient.  Risks including but not limited to the risk of anesthesia, blood clots, nerve damage, blood vessel damage, failure of the prosthesis, infection and up to and including death.  Patient understand the risks, benefits and expectations and wishes to proceed with surgery.   PCP: Sondra Come, MD  D/C Plans:       Home   Post-op Meds:       No Rx given  Tranexamic Acid:      To be given - IV   Decadron:      Is to be given  FYI:      ASA  Norco  CPAP  NWB - left LE, b/c of ORIF  DME:   Pt already has equipment  PT:   May have / will need Rx  Patient Active Problem List   Diagnosis Date Noted  . Left knee pain 07/16/2017  . Tibial plateau fracture, left 06/12/2017   Past  Medical History:  Diagnosis Date  . Arthritis   . Chest pain   . GERD (gastroesophageal reflux disease)   . HTN (hypertension)   . Pre-diabetes   . Sleep apnea    cpap   . SVT (supraventricular tachycardia) (HCC)     Past Surgical History:  Procedure Laterality Date  . ATRIAL ABLATION SURGERY    . ORIF TIBIA PLATEAU Left 06/12/2017   Procedure: Left open reduction internal fixation medial tibial plateau fracture with poly revision medial compartmental arthroplasty;  Surgeon: Durene Romans, MD;  Location: [facility];  Service: Orthopedics;  Laterality: Left;  90 mins    Current Facility-Administered Medications  Medication Dose Route Frequency Provider Last Rate Last Dose  . 0.9 %  sodium chloride infusion   Intravenous Continuous Lorcan Shelp, PA-C      . ceFAZolin (ANCEF) IVPB 2g/100 mL premix  2 g Intravenous On Call to OR Aj Crunkleton, Molli Hazard, PA-C      . chlorhexidine (HIBICLENS) 4 % liquid 4 application  60 mL Topical Once Kiwanna Spraker, PA-C      . dexamethasone (DECADRON) injection 10 mg  10 mg Intravenous Once Lanney Gins, PA-C      . HYDROmorphone (DILAUDID) injection 0.5 mg  0.5 mg Intravenous Q2H PRN Nioma Mccubbins, PA-C      . ondansetron [facility]) injection 4 mg  4 mg Intravenous Q6H PRN Huxley Shurley, PA-C      . tranexamic acid (CYKLOKAPRON) 1,000 mg in sodium chloride 0.9 % 100 mL IVPB  1,000 mg Intravenous To OR Tammie Ellsworth, PA-C       No Known Allergies   Social History   Tobacco Use  . Smoking status: Never Smoker  . Smokeless tobacco: Current User    Types: Chew, Snuff  . Tobacco comment: 25 years  Substance Use Topics  . Alcohol use: Never    Alcohol/week: 6.0 oz    Types: 12 Standard drinks or equivalent per week    Frequency: Never    Comment: no       Review of Systems  Constitutional: Negative.   HENT: Negative.   Eyes: Negative.   Respiratory: Negative.   Cardiovascular: Negative.   Gastrointestinal: Positive for heartburn.   Genitourinary: Negative.   Musculoskeletal: Positive for joint pain.  Skin: Negative.   Neurological: Negative.   Endo/Heme/Allergies: Negative.   Psychiatric/Behavioral: Negative.      Objective:  Physical Exam  Constitutional: He is oriented to person, place, and time. He appears well-developed.  HENT:  Head: Normocephalic.  Eyes: Pupils are equal, round, and reactive to light.  Neck: Neck supple. No JVD present. No tracheal deviation present. No thyromegaly present.  Cardiovascular: Normal rate, regular rhythm and intact distal pulses.  Respiratory: Effort normal and breath sounds normal. No respiratory distress. He has no wheezes.  GI: Soft. There is no tenderness. There is no guarding.  Musculoskeletal:       Left knee: He exhibits decreased range of motion, swelling and laceration (healed previous incision). He exhibits no ecchymosis, no deformity and no erythema. Tenderness found.  Lymphadenopathy:    He has no cervical adenopathy.  Neurological: He is alert and oriented to person, place, and time.  Skin: Skin is warm and dry.  Psychiatric: He has a normal mood and affect.    Vital signs in last 24 hours: Temp:  [97.9 F (36.6 C)] 97.9 F (36.6 C) (05/13 1000) Pulse Rate:  [67] 67 (05/13 1000) Resp:  [16] 16 (05/13 1000) BP: (131)/(78) 131/78 (05/13 1000) SpO2:  [96 %] 96 % (05/13 1000)  Labs:  Estimated body mass index is 42.9 kg/m as calculated from the following:   Height as of 06/12/17:  (1.778 m).   Weight as of 06/12/17: 135.6 kg (299 lb).  Imaging Review Plain radiographs demonstrate dislocated poly of the left knee(s). The overall alignment is neutral. The bone quality appears to be good for age and reported activity level.    Preoperative templating of the joint replacement has been completed, documented, and submitted to the Operating Room personnel in order to optimize intra-operative equipment management.   Assessment/Plan:  Left knee with  failed previous arthroplasty.   The patient history, physical examination, clinical judgment of the provider and imaging studies are consistent with failure of the left knee(s), previous total knee arthroplasty. Revision total knee arthroplasty is deemed medically necessary. The treatment options including medical management, injection therapy, arthroscopy and revision arthroplasty were discussed at length. The risks and benefits of revision total knee arthroplasty were presented and reviewed. The risks due to aseptic loosening, infection, stiffness, patella tracking problems, thromboembolic complications and other imponderables were discussed. The patient acknowledged the explanation, agreed to proceed with the plan and consent was signed. Patient is being admitted for inpatient treatment for surgery, pain control, PT, OT, prophylactic antibiotics, VTE prophylaxis, progressive ambulation and ADL's and  discharge planning.The patient is planning to be discharged home.     Anastasio Auerbach Oz Gammel   PA-C  07/16/2017, 11:02 AM

## 2017-07-16 NOTE — Anesthesia Postprocedure Evaluation (Signed)
Anesthesia Post Note  Patient: Thomas [name]  Procedure(s) Performed: LEFT KNEE WITH POLY EXCHANGE (Left Knee)     Patient location during evaluation: PACU Anesthesia Type: Spinal Level of consciousness: oriented and awake and alert Pain management: pain level controlled Vital Signs Assessment: post-procedure vital signs reviewed and stable Respiratory status: spontaneous breathing, respiratory function stable, patient connected to nasal cannula oxygen and nonlabored ventilation Cardiovascular status: blood pressure returned to baseline and stable Postop Assessment: no headache, no backache, no apparent nausea or vomiting and spinal receding Anesthetic complications: no    Last Vitals:  Vitals:   07/16/17 1840 07/16/17 1845  BP:    Pulse: (!) 48   Resp: 12 14  Temp: 36.4 C   SpO2: 99%     Last Pain:  Vitals:   07/16/17 1840  TempSrc:   PainSc: 0-No pain    LLE Motor Response: Purposeful movement (07/16/17 1840) LLE Sensation: Decreased (07/16/17 1840) RLE Motor Response: Purposeful movement (07/16/17 1840) RLE Sensation: Decreased (07/16/17 1840) L Sensory Level: L5-Outer lower leg, top of foot, great toe (07/16/17 1840) R Sensory Level: L5-Outer lower leg, top of foot, great toe (07/16/17 1840)  Uchenna Rappaport A.

## 2017-07-16 NOTE — Anesthesia Procedure Notes (Signed)
Anesthesia Regional Block: Adductor canal block   Pre-Anesthetic Checklist: ,, timeout performed, Correct Patient, Correct Site, Correct Laterality, Correct Procedure, Correct Position, site marked, Risks and benefits discussed,  Surgical consent,  Pre-op evaluation,  At surgeon's request and post-op pain management  Laterality: Right  Prep: chloraprep       Needles:  Injection technique: Single-shot  Needle Type: Echogenic Stimulator Needle     Needle Length: 9cm  Needle Gauge: 21   Needle insertion depth: 9 cm   Additional Needles:   Procedures:,,,, ultrasound used (permanent image in chart),,,,  Narrative:  Start time: 07/16/2017 4:08 PM End time: 07/16/2017 4:13 PM Injection made incrementally with aspirations every 5 mL.  Performed by: Personally  Anesthesiologist: Mal Amabile, MD  Additional Notes: Timeout performed. Patient sedated. Relevant anatomy ID'd using [place]. Incremental 2-80ml injection of LA with frequent aspiration. Patient tolerated procedure well.       Right Adductor Canal Block

## 2017-07-16 NOTE — Transfer of Care (Signed)
Immediate Anesthesia Transfer of Care Note  Patient: Thomas [name]  Procedure(s) Performed: LEFT KNEE WITH POLY EXCHANGE (Left Knee)  Patient Location: PACU  Anesthesia Type:Spinal  Level of Consciousness: awake, alert  and oriented  Airway & Oxygen Therapy: Patient Spontanous Breathing and Patient connected to face mask oxygen  Post-op Assessment: Report given to RN and Post -op Vital signs reviewed and stable  Post vital signs: Reviewed and stable  Last Vitals:  Vitals Value Taken Time  BP 117/75 07/16/2017  6:10 PM  Temp    Pulse 51 07/16/2017  6:14 PM  Resp 5 07/16/2017  6:14 PM  SpO2 98 % 07/16/2017  6:14 PM  Vitals shown include unvalidated device data.  Last Pain:  Vitals:   07/16/17 1613  TempSrc:   PainSc: 0-No pain      Patients Stated Pain Goal: 2 (07/16/17 1130)  Complications: No apparent anesthesia complications

## 2017-07-17 ENCOUNTER — Encounter (HOSPITAL_COMMUNITY): Payer: Self-pay | Admitting: Orthopedic Surgery

## 2017-07-17 LAB — BASIC METABOLIC PANEL
Anion gap: 9 (ref 5–15)
BUN: 10 mg/dL (ref 6–20)
CALCIUM: 9.2 mg/dL (ref 8.9–10.3)
CO2: 28 mmol/L (ref 22–32)
Chloride: 102 mmol/L (ref 101–111)
Creatinine, Ser: 0.81 mg/dL (ref 0.61–1.24)
GFR calc Af Amer: >60 mL/min (ref >60)
GLUCOSE: 172 mg/dL — AB (ref 65–99)
POTASSIUM: 4.4 mmol/L (ref 3.5–5.1)
Sodium: 139 mmol/L (ref 135–145)

## 2017-07-17 LAB — CBC
HCT: 40.7 % (ref 39.0–52.0)
Hemoglobin: 13.7 g/dL (ref 13.0–17.0)
MCH: 28.5 pg (ref 26.0–34.0)
MCHC: 33.7 g/dL (ref 30.0–36.0)
MCV: 84.6 fL (ref 78.0–100.0)
PLATELETS: 117 10*3/uL — AB (ref 150–400)
RBC: 4.81 MIL/uL (ref 4.22–5.81)
RDW: 14.2 % (ref 11.5–15.5)
WBC: 6.7 10*3/uL (ref 4.0–10.5)

## 2017-07-17 MED ORDER — METHOCARBAMOL 500 MG PO TABS: 500.0000 mg | ORAL_TABLET | Freq: Four times a day (QID) | ORAL | 0 refills | Status: DC | PRN

## 2017-07-17 MED ORDER — CHLORHEXIDINE GLUCONATE CLOTH 2 % EX PADS: 6.0000 | MEDICATED_PAD | Freq: Every day | CUTANEOUS | Status: DC

## 2017-07-17 MED ORDER — DOCUSATE SODIUM 100 MG PO CAPS: 100.0000 mg | ORAL_CAPSULE | Freq: Two times a day (BID) | ORAL | 0 refills | Status: DC

## 2017-07-17 MED ORDER — FERROUS SULFATE 325 (65 FE) MG PO TABS: 325.0000 mg | ORAL_TABLET | Freq: Three times a day (TID) | ORAL | 3 refills | Status: DC

## 2017-07-17 MED ORDER — ACETAMINOPHEN 500 MG PO TABS: 1000.0000 mg | ORAL_TABLET | Freq: Three times a day (TID) | ORAL | 0 refills | Status: DC

## 2017-07-17 MED ORDER — ASPIRIN 81 MG PO CHEW: 81.0000 mg | CHEWABLE_TABLET | Freq: Two times a day (BID) | ORAL | 0 refills | Status: AC

## 2017-07-17 MED ORDER — OXYCODONE HCL 5 MG PO TABS: 5.0000 mg | ORAL_TABLET | ORAL | 0 refills | Status: DC | PRN

## 2017-07-17 MED ORDER — MUPIROCIN 2 % EX OINT
1.0000 "application " | TOPICAL_OINTMENT | Freq: Two times a day (BID) | CUTANEOUS | Status: DC
  Administered 2017-07-17: 1 via NASAL

## 2017-07-17 MED ORDER — POLYETHYLENE GLYCOL 3350 17 G PO PACK: 17.0000 g | PACK | Freq: Two times a day (BID) | ORAL | 0 refills | Status: DC

## 2017-07-17 NOTE — Progress Notes (Signed)
Pt and family member were provided with discharge instructions and prescriptions. Any questions or concerns were addressed. Pt reports no further questions or concerns regarding discharge instructions/plan of care.

## 2017-07-17 NOTE — Addendum Note (Signed)
Addendum  created 07/17/17 1610 by Elyn Peers, CRNA   Charge Capture section accepted

## 2017-07-17 NOTE — Progress Notes (Signed)
Patient ID: Thomas [name], male   DOB: Mar 09, 1970, 47 y.o.   MRN: [id number] Subjective: 1 Day Post-Op Procedure(s) (LRB): LEFT KNEE WITH POLY EXCHANGE (Left)    Patient reports pain as mild. No events, comfortable in bed this am  Objective:   VITALS:   Vitals:   07/17/17 0155 07/17/17 0630  BP: 119/80 137/86  Pulse: 83 86  Resp:    Temp: 97.9 F (36.6 C) 98.1 F (36.7 C)  SpO2: 95% 93%    Neurovascular intact Incision: dressing C/D/I  LABS Recent Labs    07/16/17 1600 07/17/17 0517  HGB 13.6 13.7  HCT 40.8 40.7  WBC 5.4 6.7  PLT 112* 117*    Recent Labs    07/17/17 0517  NA 139  K 4.4  BUN 10  CREATININE 0.81  GLUCOSE 172*    No results for input(s): LABPT, INR in the last 72 hours.   Assessment/Plan: 1 Day Post-Op Procedure(s) (LRB): LEFT KNEE WITH POLY EXCHANGE (Left)   Advance diet Up with therapy - NWB LLE with knee immobilizer for first 2 weeks Home today after am therapy RTC in 2 weeks Reviewed findings and post-op expectations, goals

## 2017-07-17 NOTE — Op Note (Signed)
NAMEZakariye [name], Thomas MEDICAL RECORD ZO:[id number] ACCOUNT [id number] DATE OF BIRTH:11/29/1970 FACILITY: [facility] LOCATION: [facility] PHYSICIAN:Tylique Aull DCharlann Boxer, MD  OPERATIVE REPORT  DATE OF PROCEDURE:  07/16/2017  PREOPERATIVE DIAGNOSIS:  Dislocation of left knee polyethylene following partial knee arthroplasty, complicated by periprosthetic fracture.  POSTOPERATIVE DIAGNOSIS:  Dislocation of left knee polyethylene following partial knee arthroplasty, complicated by periprosthetic fracture.  PROCEDURE:  Revision polyethylene, left partial knee.  SURGEON:  Durene Romans, MD  ASSISTANT:  Lanney Gins PA-C.  Note that Mr. Carmon [name] was present for the entirety of the case for preoperative positioning, perioperative management of the operative extremity, general facilitation of the case, and primary wound closure.  ANESTHESIA:  Spinal.  SPECIMENS:  None.  COMPLICATIONS:  None.  TOURNIQUET:  27 minutes at 250 mmHg.  INDICATIONS FOR PROCEDURE:  The patient is a 47 year old male 5 months ago having a left partial knee arthroplasty.  He had been doing well until approximately a month ago when he had a fracture involving the proximal tibia on the left, consistent with a  periprosthetic fracture.  This resulted in dislocation of his polyethylene at that time.  He underwent an open reduction internal fixation of the tibial plateau fracture with a revision of the polyethylene to a size 9 polyethylene.  He had been doing  well in the initial postoperative period; however, today presented to the office after feeling a pop in his knee while he was in his truck.  He was seen and evaluated in the office earlier today and noted to have a polyethylene dislocation anteriorly.   He was then subsequently admitted to the hospital for same-day admission to acutely manage this.  Risks and benefits of proposed procedure were discussed; however, there was no further polyethylene thickness available.  I discussed that we  would I and D  his left knee and replace the polyethylene, then close him up.  I would subsequently place him in a knee immobilizer and keep him nonweightbearing due to the fracture.  A consent was obtained for procedure as noted.  PROCEDURE IN DETAIL:  The patient was brought to the operative theater.  Once adequate anesthesia, preoperative antibiotics administered, 3 g of Ancef, as well as tranexamic acid.  He was positioned supine.  A left thigh tourniquet was placed.  The left  lower extremity was then prepped and draped in sterile fashion.  A timeout was performed identifying the patient, planned procedure and extremity.  A portion of his midline incision was utilized.  Soft tissue planes were created.  No significant scarring in this area.  The medial arthrotomy was made and evacuated hematoma.  We identified the dislocated poly.  The poly was placed into a saline bath  while we were doing the rest of the procedure given the proximity of his recent procedure.  Once I had debrided it adequately and exposed and then irrigated with a liter of normal saline solution, we replaced the polyethylene, which still required it  being snapped into place.  Once this was completed, I paid attention to as tight a closure of his retinaculum as I could.  Due to his previous trauma of the proximal tibia, some of the retinacular tissue was fairly attenuated at the distal aspect in the area of his plate.  I used #1 Vicryl and then oversewed this with a running locking suture.  The remainder of the wound was closed with 2-0 Vicryl and a running 3-0 Monocryl.  The wound was then cleaned, dried and dressed sterilely using  surgical glue and Aquacel dressing.  Postoperatively, his knee was wrapped with an Ace wrap and placed in a knee immobilizer.  He will be nonweightbearing on this left lower extremity until further directed in the office following healing.  Ultimately, as I reviewed with him in the office, this  will need to be revised to a total knee replacement.  I need to try to get as much healing of his proximal tibia fracture as possible prior to proceeding with this to prevent complications  intraoperatively.  LN/NUANCE  D:07/16/2017 T:07/17/2017 JOB:[id number]

## 2017-07-17 NOTE — Evaluation (Signed)
Physical Therapy Evaluation Patient Details Name: Thomas [name] MRN: [id number] DOB: 12-Feb-1971 Today's Date: 07/17/2017   History of Present Illness  47 yo male admitted with polyethylene dislocation. S/P I&D and polyethylene revision 07/16/17. Hx of periprosthetic fx-ORIF 06/2017    Clinical Impression  At start of session, found NT in room with pt leaving bathroom. Pt walking out of the bathroom without KI on and while placing weight on L LE (had discussion with CN-Lancie about this as well). Educated pt and NT on restrictions. Assisted pt back into bed to don KI. Proceeded with PT session. Pt walked ~50 feet with crutches and ascended/descended 1 step. Pt rated pain 4/10. Educated pt again on general safety,need to wear KI, and maintain NWB status until surgeon states otherwise. All education completed. Pt had no further questions/concerns. He stated he has been on crutches since last admission and he prefers to continue with the same. Okay to d/c from PT standpoint.     Follow Up Recommendations No PT follow up(f/u per surgeon recommendations/plan)    Equipment Recommendations  None recommended by PT    Recommendations for Other Services       Precautions / Restrictions Precautions Precautions: Fall Required Braces or Orthoses: Knee Immobilizer - Left Knee Immobilizer - Left: On at all times Restrictions Weight Bearing Restrictions: Yes LLE Weight Bearing: Non weight bearing      Mobility  Bed Mobility Overal bed mobility: Modified Independent                Transfers Overall transfer level: Needs assistance Equipment used: Crutches Transfers: Sit to/from Stand Sit to Stand: Min guard         General transfer comment: VCs safety, L LE placement, adherence to NWB status. Close guard for safety.   Ambulation/Gait Ambulation/Gait assistance: Min guard Ambulation Distance (Feet): 50 Feet Assistive device: Crutches Gait Pattern/deviations: Step-to pattern      General Gait Details: Very close guarding for safety. Cues for safety, pacing, adherence to NWB status. Pt tends to move quickly and at times out of control.   Stairs Stairs: Yes Stairs assistance: Min guard Stair Management: Step to pattern;Forwards;With crutches Number of Stairs: 1 General stair comments: Pt stated he only has 1 step to negotiate. VCs safety, technique, adherence to NWB status. Very close guarding.   Wheelchair Mobility    Modified Rankin (Stroke Patients Only)       Balance                                             Pertinent Vitals/Pain Pain Assessment: 0-10 Pain Score: 5  Pain Location: L LE Pain Descriptors / Indicators: Aching;Sore Pain Intervention(s): Monitored during session;Repositioned    Home Living Family/patient expects to be discharged to:: Private residence Living Arrangements: Spouse/significant other Available Help at Discharge: Family Type of Home: House Home Access: Stairs to enter Entrance Stairs-Rails: None Secretary/administrator of Steps: 1 Home Layout: One level Home Equipment: Environmental consultant - 2 wheels;Crutches      Prior Function Level of Independence: Independent with assistive device(s)         Comments: using crutches prior to this surgery     Hand Dominance        Extremity/Trunk Assessment   Upper Extremity Assessment Upper Extremity Assessment: Overall WFL for tasks assessed    Lower Extremity Assessment Lower Extremity Assessment: LLE deficits/detail LLE  Deficits / Details: moves ankle well LLE: Unable to fully assess due to immobilization    Cervical / Trunk Assessment Cervical / Trunk Assessment: Normal  Communication   Communication: No difficulties  Cognition Arousal/Alertness: Awake/alert Behavior During Therapy: WFL for tasks assessed/performed Overall Cognitive Status: Within Functional Limits for tasks assessed                                         General Comments      Exercises     Assessment/Plan    PT Assessment Patient needs continued PT services  PT Problem List Decreased mobility;Obesity;Pain;Decreased knowledge of use of DME;Decreased knowledge of precautions       PT Treatment Interventions DME instruction;Functional mobility training;Therapeutic activities;Balance training;Patient/family education;Therapeutic exercise;Gait training    PT Goals (Current goals can be found in the Care Plan section)  Acute Rehab PT Goals Patient Stated Goal: home PT Goal Formulation: With patient Time For Goal Achievement: 07/31/17 Potential to Achieve Goals: Good    Frequency 7X/week   Barriers to discharge        Co-evaluation               AM-PAC PT "6 Clicks" Daily Activity  Outcome Measure Difficulty turning over in bed (including adjusting bedclothes, sheets and blankets)?: None Difficulty moving from lying on back to sitting on the side of the bed? : None Difficulty sitting down on and standing up from a chair with arms (e.g., wheelchair, bedside commode, etc,.)?: A Little Help needed moving to and from a bed to chair (including a wheelchair)?: A Little Help needed walking in hospital room?: A Little Help needed climbing 3-5 steps with a railing? : A Little 6 Click Score: 20    End of Session Equipment Utilized During Treatment: Gait belt;Left knee immobilizer Activity Tolerance: Patient tolerated treatment well Patient left: in bed;with call bell/phone within reach   PT Visit Diagnosis: Difficulty in walking, not elsewhere classified (R26.2)    Time: 4098-1191 PT Time Calculation (min) (ACUTE ONLY): 8 min   Charges:   PT Evaluation $PT Eval Low Complexity: 1 Low     PT G Codes:          Rebeca Alert, MPT Pager: [phone]

## 2017-07-23 NOTE — Discharge Summary (Signed)
Physician Discharge Summary  Patient ID: Thomas [name] MRN: [id number] DOB/AGE: 08/27/1970 47 y.o.  Admit date: 07/16/2017 Discharge date: 07/17/2017   Procedures:  Procedure(s) (LRB): LEFT KNEE WASH OUT WITH REIMPLANTATION OF EXISTING IMPLANT AFTER SALINE CLEANING. (Left)  Attending Physician:  Dr. Durene Romans   Admission Diagnoses:   Left knee dislocation of poly from [facility]  Discharge Diagnoses:  Principal Problem:   Left knee pain Active Problems:   S/P revision of total knee, left  Past Medical History:  Diagnosis Date  . Arthritis   . Chest pain   . GERD (gastroesophageal reflux disease)   . HTN (hypertension)   . Pre-diabetes   . Sleep apnea    cpap   . SVT (supraventricular tachycardia) (HCC)     HPI:     Thomas [name], 47 y.o. male, has a history of pain and functional disability in the left knee(s) due to failed previous arthroplasty, poly dislocation.  The indications for the revision of the total knee arthroplasty are poly dislocation. Onset of symptoms was abrupt starting this morning with stable course since that time. Patient was in his truck this morning when he moved his leg, felt a pop and was unable to fully move his leg.  Prior procedures on the left knee(s) include unicompartmental arthroplasty. Patient currently rates pain in the left knee(s) at 10 out of 10 with activity. There is worsening of pain with activity and weight bearing, pain that interferes with activities of daily living, pain with passive range of motion, crepitus and joint swelling.  Patient has evidence of dislocated poly by imaging studies. This condition presents safety issues increasing the risk of falls.   There is no current active infection. Risks, benefits and expectations were discussed with the patient.  Risks including but not limited to the risk of anesthesia, blood clots, nerve damage, blood vessel damage, failure of the prosthesis, infection and up to and including death.  Patient  understand the risks, benefits and expectations and wishes to proceed with surgery.   PCP: Sondra Come, MD   Discharged Condition: good  Hospital Course:  Patient underwent the above stated procedure on 07/16/2017. Patient tolerated the procedure well and brought to the recovery room in good condition and subsequently to the floor.  POD #1 BP: 137/86 ; Pulse: 86 ; Temp: 98.1 F (36.7 C)  Patient reports pain as mild. No events, comfortable in bed this am. Neurovascular intact and incision: dressing C/D/I.   LABS  Basename    HGB     13.7  HCT     40.7    Discharge Exam: General appearance: alert, cooperative and no distress Extremities: Homans sign is negative, no sign of DVT, no edema, redness or tenderness in the calves or thighs and no ulcers, gangrene or trophic changes  Disposition:  Home with follow up in 2 weeks   Follow-up Information    Durene Romans, MD. Schedule an appointment as soon as possible for a visit in 2 weeks.   Specialty:  Orthopedic Surgery Contact information: [address] [place] [address] [phone]           Discharge Instructions    Call MD / Call 911   Complete by:  As directed    If you experience chest pain or shortness of breath, CALL 911 and be transported to the hospital emergency room.  If you develope a fever above 101 F, pus (white drainage) or increased drainage or redness at the wound,  or calf pain, call your surgeon's office.   Change dressing   Complete by:  As directed    Maintain surgical dressing until follow up in the clinic. If the edges start to pull up, may reinforce with tape. If the dressing is no longer working, may remove and cover with gauze and tape, but must keep the area dry and clean.  Call with any questions or concerns.   Constipation Prevention   Complete by:  As directed    Drink plenty of fluids.  Prune juice may be helpful.  You may use a stool softener, such as Colace (over the  counter) 100 mg twice a day.  Use MiraLax (over the counter) for constipation as needed.   Diet - low sodium heart healthy   Complete by:  As directed    Discharge instructions   Complete by:  As directed    Maintain surgical dressing until follow up in the clinic. If the edges start to pull up, may reinforce with tape. If the dressing is no longer working, may remove and cover with gauze and tape, but must keep the area dry and clean.  Follow up in 2 weeks at [facility]. Call with any questions or concerns.   Non weight bearing   Complete by:  As directed    Use Knee immobilizer at all times.   Laterality:  left   Extremity:  Lower   Use Knee immobilizer at all times.   TED hose   Complete by:  As directed    Use stockings (TED hose) for 2 weeks on both leg(s).  You may remove them at night for sleeping.      Allergies as of 07/17/2017   No Known Allergies     Medication List    STOP taking these medications   ICY HOT EX     TAKE these medications   acetaminophen 500 MG tablet Commonly known as:  TYLENOL Take 2 tablets (1,000 mg total) by mouth every 8 (eight) hours.   aspirin 81 MG chewable tablet Commonly known as:  ASPIRIN CHILDRENS Chew 1 tablet (81 mg total) by mouth 2 (two) times daily. Take for 4 weeks, then resume regular dose.   docusate sodium 100 MG capsule Commonly known as:  COLACE Take 1 capsule (100 mg total) by mouth 2 (two) times daily. What changed:    when to take this  reasons to take this   enalapril 10 MG tablet Commonly known as:  VASOTEC Take 10 mg by mouth daily.   escitalopram 20 MG tablet Commonly known as:  LEXAPRO Take 20 mg by mouth daily.   ferrous sulfate 325 (65 FE) MG tablet Commonly known as:  FERROUSUL Take 1 tablet (325 mg total) by mouth 3 (three) times daily with meals.   hydrOXYzine 25 MG tablet Commonly known as:  ATARAX/VISTARIL Take 25 mg by mouth 3 (three) times daily as needed for anxiety.     methocarbamol 500 MG tablet Commonly known as:  ROBAXIN Take 1 tablet (500 mg total) by mouth every 6 (six) hours as needed for muscle spasms.   metoprolol tartrate 100 MG tablet Commonly known as:  LOPRESSOR Take 100 mg by mouth 2 (two) times daily.   multivitamin tablet Take 1 tablet by mouth daily.   omeprazole 20 MG capsule Commonly known as:  PRILOSEC Take 20 mg by mouth daily.   oxyCODONE 5 MG immediate release tablet Commonly known as:  Oxy IR/ROXICODONE Take 1-2 tablets (5-10 mg  total) by mouth every 4 (four) hours as needed for moderate pain or severe pain. What changed:    when to take this  reasons to take this   polyethylene glycol packet Commonly known as:  MIRALAX / GLYCOLAX Take 17 g by mouth 2 (two) times daily. What changed:    when to take this  reasons to take this   prazosin 1 MG capsule Commonly known as:  MINIPRESS Take 4 mg by mouth at bedtime. For nightmares   pregabalin 300 MG capsule Commonly known as:  LYRICA Take 300 mg by mouth 2 (two) times daily.   traZODone 100 MG tablet Commonly known as:  DESYREL Take 200 mg by mouth at bedtime.   verapamil 120 MG CR tablet Commonly known as:  CALAN-SR Take 120 mg by mouth at bedtime.            Discharge Care Instructions  (From admission, onward)        Start     Ordered   07/17/17 0000  Change dressing    Comments:  Maintain surgical dressing until follow up in the clinic. If the edges start to pull up, may reinforce with tape. If the dressing is no longer working, may remove and cover with gauze and tape, but must keep the area dry and clean.  Call with any questions or concerns.   07/17/17 0930   07/17/17 0000  Non weight bearing    Comments:  Use Knee immobilizer at all times.  Question Answer Comment  Laterality left   Extremity Lower      07/17/17 0930       Signed: Anastasio Auerbach. Bayani Renteria   PA-C  07/23/2017, 2:46 PM

## 2017-09-19 NOTE — Patient Instructions (Signed)
Thomas [name]  09/19/2017   Your procedure is scheduled on: 09-25-17   Report to [facility] Main  Entrance               Report to admitting at     100 PM    Call this number if you have problems the morning of surgery [phone]    Remember: Do not eat food  :After Midnight. You may have clear liquids until 0930 am then nothing by mouth      CLEAR LIQUID DIET   Foods Allowed                                                                     Foods Excluded  Coffee and tea, regular and decaf                             liquids that you cannot  Plain Jell-O in any flavor                                             see through such as: Fruit ices (not with fruit pulp)                                     milk, soups, orange juice  Iced Popsicles                                    All solid food Carbonated beverages, regular and diet                                    Cranberry, grape and apple juices Sports drinks like Gatorade Lightly seasoned clear broth or consume(fat free) Sugar, honey syrup  _____________________________________________________________________    Take these medicines the morning of surgery with A SIP OF WATER: verapamil, lyrica, omeprazole, metoprolol, lexapro                                You may not have any metal on your body including hair pins and              piercings  Do not wear jewelry,, lotions, powders or perfumes, deodorant                       Men may shave face and neck.   Do not bring valuables to the hospital.  IS NOT             RESPONSIBLE   FOR VALUABLES.  Contacts, dentures or bridgework may not be worn into surgery.  Leave suitcase in the car. After surgery it may be brought to your room.  Please read over the following fact sheets you were given: _____________________________________________________________________           [facility] - Preparing for Surgery Before  surgery, you can play an important role.  Because skin is not sterile, your skin needs to be as free of germs as possible.  You can reduce the number of germs on your skin by washing with CHG (chlorahexidine gluconate) soap before surgery.  CHG is an antiseptic cleaner which kills germs and bonds with the skin to continue killing germs even after washing. Please DO NOT use if you have an allergy to CHG or antibacterial soaps.  If your skin becomes reddened/irritated stop using the CHG and inform your nurse when you arrive at Short Stay. Do not shave (including legs and underarms) for at least 48 hours prior to the first CHG shower.  You may shave your face/neck. Please follow these instructions carefully:  1.  Shower with CHG Soap the night before surgery and the  morning of Surgery.  2.  If you choose to wash your hair, wash your hair first as usual with your  normal  shampoo.  3.  After you shampoo, rinse your hair and body thoroughly to remove the  shampoo.                           4.  Use CHG as you would any other liquid soap.  You can apply chg directly  to the skin and wash                       Gently with a scrungie or clean washcloth.  5.  Apply the CHG Soap to your body ONLY FROM THE NECK DOWN.   Do not use on face/ open                           Wound or open sores. Avoid contact with eyes, ears mouth and genitals (private parts).                       Wash face,  Genitals (private parts) with your normal soap.             6.  Wash thoroughly, paying special attention to the area where your surgery  will be performed.  7.  Thoroughly rinse your body with warm water from the neck down.  8.  DO NOT shower/wash with your normal soap after using and rinsing off  the CHG Soap.                9.  Pat yourself dry with a clean towel.            10.  Wear clean pajamas.            11.  Place clean sheets on your bed the night of your first shower and do not  sleep with pets. Day of Surgery  : Do not apply any lotions/deodorants the morning of surgery.  Please wear clean clothes to the hospital/surgery center.  FAILURE TO FOLLOW THESE INSTRUCTIONS MAY RESULT IN THE CANCELLATION OF YOUR SURGERY PATIENT SIGNATURE_________________________________  NURSE SIGNATURE__________________________________  ________________________________________________________________________  WHAT IS A BLOOD TRANSFUSION? Blood Transfusion Information  A transfusion is the replacement of blood or some of its parts. Blood is made up of multiple cells which provide different functions.  Red blood  cells carry oxygen and are used for blood loss replacement.  White blood cells fight against infection.  Platelets control bleeding.  Plasma helps clot blood.  Other blood products are available for specialized needs, such as hemophilia or other clotting disorders. BEFORE THE TRANSFUSION  Who gives blood for transfusions?   Healthy volunteers who are fully evaluated to make sure their blood is safe. This is blood bank blood. Transfusion therapy is the safest it has ever been in the practice of medicine. Before blood is taken from a donor, a complete history is taken to make sure that person has no history of diseases nor engages in risky social behavior (examples are intravenous drug use or sexual activity with multiple partners). The donor's travel history is screened to minimize risk of transmitting infections, such as malaria. The donated blood is tested for signs of infectious diseases, such as HIV and hepatitis. The blood is then tested to be sure it is compatible with you in order to minimize the chance of a transfusion reaction. If you or a relative donates blood, this is often done in anticipation of surgery and is not appropriate for emergency situations. It takes many days to process the donated blood. RISKS AND COMPLICATIONS Although transfusion therapy is very safe and saves many lives, the main  dangers of transfusion include:   Getting an infectious disease.  Developing a transfusion reaction. This is an allergic reaction to something in the blood you were given. Every precaution is taken to prevent this. The decision to have a blood transfusion has been considered carefully by your caregiver before blood is given. Blood is not given unless the benefits outweigh the risks. AFTER THE TRANSFUSION  Right after receiving a blood transfusion, you will usually feel much better and more energetic. This is especially true if your red blood cells have gotten low (anemic). The transfusion raises the level of the red blood cells which carry oxygen, and this usually causes an energy increase.  The nurse administering the transfusion will monitor you carefully for complications. HOME CARE INSTRUCTIONS  No special instructions are needed after a transfusion. You may find your energy is better. Speak with your caregiver about any limitations on activity for underlying diseases you may have. SEEK MEDICAL CARE IF:   Your condition is not improving after your transfusion.  You develop redness or irritation at the intravenous (IV) site. SEEK IMMEDIATE MEDICAL CARE IF:  Any of the following symptoms occur over the next 12 hours:  Shaking chills.  You have a temperature by mouth above 102 F (38.9 C), not controlled by medicine.  Chest, back, or muscle pain.  People around you feel you are not acting correctly or are confused.  Shortness of breath or difficulty breathing.  Dizziness and fainting.  You get a rash or develop hives.  You have a decrease in urine output.  Your urine turns a dark color or changes to pink, red, or brown. Any of the following symptoms occur over the next 10 days:  You have a temperature by mouth above 102 F (38.9 C), not controlled by medicine.  Shortness of breath.  Weakness after normal activity.  The white part of the eye turns yellow  (jaundice).  You have a decrease in the amount of urine or are urinating less often.  Your urine turns a dark color or changes to pink, red, or brown. Document Released: 02/18/2000 Document Revised: 05/15/2011 Document Reviewed: 10/07/2007 [facility] Patient Information 2014 [place], [place].  _______________________________________________________________________  Incentive Spirometer  An incentive spirometer is a tool that can help keep your lungs clear and active. This tool measures how well you are filling your lungs with each breath. Taking long deep breaths may help reverse or decrease the chance of developing breathing (pulmonary) problems (especially infection) following:  A long period of time when you are unable to move or be active. BEFORE THE PROCEDURE   If the spirometer includes an indicator to show your best effort, your nurse or respiratory therapist will set it to a desired goal.  If possible, sit up straight or lean slightly forward. Try not to slouch.  Hold the incentive spirometer in an upright position. INSTRUCTIONS FOR USE  1. Sit on the edge of your bed if possible, or sit up as far as you can in bed or on a chair. 2. Hold the incentive spirometer in an upright position. 3. Breathe out normally. 4. Place the mouthpiece in your mouth and seal your lips tightly around it. 5. Breathe in slowly and as deeply as possible, raising the piston or the ball toward the top of the column. 6. Hold your breath for 3-5 seconds or for as long as possible. Allow the piston or ball to fall to the bottom of the column. 7. Remove the mouthpiece from your mouth and breathe out normally. 8. Rest for a few seconds and repeat Steps 1 through 7 at least 10 times every 1-2 hours when you are awake. Take your time and take a few normal breaths between deep breaths. 9. The spirometer may include an indicator to show your best effort. Use the indicator as a goal to work toward during each  repetition. 10. After each set of 10 deep breaths, practice coughing to be sure your lungs are clear. If you have an incision (the cut made at the time of surgery), support your incision when coughing by placing a pillow or rolled up towels firmly against it. Once you are able to get out of bed, walk around indoors and cough well. You may stop using the incentive spirometer when instructed by your caregiver.  RISKS AND COMPLICATIONS  Take your time so you do not get dizzy or light-headed.  If you are in pain, you may need to take or ask for pain medication before doing incentive spirometry. It is harder to take a deep breath if you are having pain. AFTER USE  Rest and breathe slowly and easily.  It can be helpful to keep track of a log of your progress. Your caregiver can provide you with a simple table to help with this. If you are using the spirometer at home, follow these instructions: SEEK MEDICAL CARE IF:   You are having difficultly using the spirometer.  You have trouble using the spirometer as often as instructed.  Your pain medication is not giving enough relief while using the spirometer.  You develop fever of 100.5 F (38.1 C) or higher. SEEK IMMEDIATE MEDICAL CARE IF:   You cough up bloody sputum that had not been present before.  You develop fever of 102 F (38.9 C) or greater.  You develop worsening pain at or near the incision site. MAKE SURE YOU:   Understand these instructions.  Will watch your condition.  Will get help right away if you are not doing well or get worse. Document Released: 07/03/2006 Document Revised: 05/15/2011 Document Reviewed: 09/03/2006 [facility] Patient Information 2014 [place], [place].   ________________________________________________________________________

## 2017-09-20 ENCOUNTER — Other Ambulatory Visit (HOSPITAL_COMMUNITY): Payer: Non-veteran care

## 2017-09-20 ENCOUNTER — Encounter (HOSPITAL_COMMUNITY): Payer: Self-pay

## 2017-09-20 ENCOUNTER — Encounter (HOSPITAL_COMMUNITY)
Admission: RE | Admit: 2017-09-20 | Discharge: 2017-09-20 | Disposition: A | Discharge status: Home / Self Care | Payer: No Typology Code available for payment source | Source: Ambulatory Visit | Attending: Orthopedic Surgery | Admitting: Orthopedic Surgery

## 2017-09-20 ENCOUNTER — Other Ambulatory Visit: Payer: Self-pay

## 2017-09-20 DIAGNOSIS — T8489XA Other specified complication of internal orthopedic prosthetic devices, implants and grafts, initial encounter: Secondary | ICD-10-CM | POA: Insufficient documentation

## 2017-09-20 DIAGNOSIS — Z01818 Encounter for other preprocedural examination: Secondary | ICD-10-CM | POA: Insufficient documentation

## 2017-09-20 DIAGNOSIS — Y838 Other surgical procedures as the cause of abnormal reaction of the patient, or of later complication, without mention of misadventure at the time of the procedure: Secondary | ICD-10-CM | POA: Diagnosis not present

## 2017-09-20 LAB — CBC
HCT: 43.5 % (ref 39.0–52.0)
HEMOGLOBIN: 14.8 g/dL (ref 13.0–17.0)
MCH: 28.9 pg (ref 26.0–34.0)
MCHC: 34 g/dL (ref 30.0–36.0)
MCV: 85 fL (ref 78.0–100.0)
PLATELETS: 123 10*3/uL — AB (ref 150–400)
RBC: 5.12 MIL/uL (ref 4.22–5.81)
RDW: 14.2 % (ref 11.5–15.5)
WBC: 7.4 10*3/uL (ref 4.0–10.5)

## 2017-09-20 LAB — COMPREHENSIVE METABOLIC PANEL
ALT: 28 U/L (ref 0–44)
ANION GAP: 9 (ref 5–15)
AST: 27 U/L (ref 15–41)
Albumin: 4.1 g/dL (ref 3.5–5.0)
Alkaline Phosphatase: 70 U/L (ref 38–126)
BUN: 18 mg/dL (ref 6–20)
CO2: 27 mmol/L (ref 22–32)
Calcium: 9.2 mg/dL (ref 8.9–10.3)
Chloride: 105 mmol/L (ref 98–111)
Creatinine, Ser: 0.76 mg/dL (ref 0.61–1.24)
Glucose, Bld: 224 mg/dL — ABNORMAL HIGH (ref 70–99)
POTASSIUM: 4.4 mmol/L (ref 3.5–5.1)
Sodium: 141 mmol/L (ref 135–145)
Total Bilirubin: 0.5 mg/dL (ref 0.3–1.2)
Total Protein: 7 g/dL (ref 6.5–8.1)

## 2017-09-20 LAB — HEMOGLOBIN A1C
HEMOGLOBIN A1C: 5.6 % (ref 4.8–5.6)
MEAN PLASMA GLUCOSE: 114.02 mg/dL

## 2017-09-20 LAB — SURGICAL PCR SCREEN
MRSA, PCR: NEGATIVE
STAPHYLOCOCCUS AUREUS: NEGATIVE

## 2017-09-20 NOTE — Progress Notes (Signed)
ekg 07-16-17 epic 

## 2017-09-20 NOTE — Progress Notes (Signed)
Cbc done 09-20-17 routed to Dr. Charlann Boxerlin via epic

## 2017-09-24 MED ORDER — DEXTROSE 5 % IV SOLN
3.0000 g | INTRAVENOUS | Status: AC
  Administered 2017-09-25: 3 g via INTRAVENOUS
  Filled 2017-09-24: qty 3

## 2017-09-24 NOTE — H&P (Signed)
Thomas [name] is an 47 y.o. male.    Chief Complaint:   Failed left UKR   Procedure:  Conversion of the left UKR to a TKA   HPI: Pt is a 47 y.o. male complaining of left knee pain .  Thomas [name] presented to the office 2 months out from left uni knee arthroplasty with poly revision, DOS: 07/16/17. 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 He presented recently to the clinic wearing a knee brace and using a crutch. He states his knee has been feeling like the knee has been popping out of place, and he could barely put weight on it. He takes Tramadol as needed for pain. A dog bit him on the left medial thigh 2 weeks ago. He has bruising and a laceration. He had not been taking antibiotics, but was placed on some in the clinic.  He has not been running fevers or having constitutional symptoms.  Discussion was had with the patient and Dr. Charlann Boxer that he will undergo a conversion of the left UKR to a TKA.  Risks, benefits and expectations were discussed with the patient.  Risks including but not limited to the risk of anesthesia, blood clots, nerve damage, blood vessel damage, failure of the prosthesis, infection and up to and including death.  Patient understand the risks, benefits and expectations and wishes to proceed with surgery.    PCP: Sondra Come, MD  D/C Plans:       Home   Post-op Meds:       Rx for Oxy given to get [facility] approval  Tranexamic Acid:      To be given - IV   Decadron:      Is to be given  FYI:      ASA  Oxycodone (home Rx already sent)  CPAP  DME:   Pt already has equipment   PT:   OPPT No rx given   PMH: Past Medical History:  Diagnosis Date  . Anxiety    occasionally  . Arthritis   . Chest pain   . GERD (gastroesophageal reflux disease)   . HTN (hypertension)   . Pre-diabetes   . Sleep apnea    cpap   . SVT  (supraventricular tachycardia) (HCC)     PSH: Past Surgical History:  Procedure Laterality Date  . ATRIAL ABLATION SURGERY    . I&D KNEE WITH POLY EXCHANGE Left 07/16/2017   Procedure: LEFT KNEE WASH OUT WITH REIMPLANTATION OF EXISTING IMPLANT AFTER SALINE CLEANING.;  Surgeon: Durene Romans, MD;  Location: [facility];  Service: Orthopedics;  Laterality: Left;  with block  . JOINT REPLACEMENT     total knee arthroplasty 09-25-17 Dr. Charlann Boxer  . ORIF TIBIA PLATEAU Left 06/12/2017   Procedure: Left open reduction internal fixation medial tibial plateau fracture with poly revision medial compartmental arthroplasty;  Surgeon: Durene Romans, MD;  Location: [facility];  Service: Orthopedics;  Laterality: Left;  90 mins    Social History:  reports that he has never smoked. His smokeless tobacco use includes chew and snuff. He reports that he drank about 7.2 oz of alcohol per week. He reports that he does not use drugs.  Allergies:  No Known Allergies  Medications: Current Facility-Administered Medications  Medication Dose Route Frequency Provider Last Rate Last Dose  . [START ON 09/25/2017] ceFAZolin (ANCEF) 3 g in dextrose 5 %  50 mL IVPB  3 g Intravenous On Call to OR Durene Romans, MD       Current Outpatient Medications  Medication Sig Dispense Refill  . amoxicillin-clavulanate (AUGMENTIN) 875-125 MG tablet Take 1 tablet by mouth 2 (two) times daily.  0  . diclofenac sodium (VOLTAREN) 1 % GEL Apply 1 application topically 4 (four) times daily as needed (for pain).    . enalapril (VASOTEC) 20 MG tablet Take 20 mg by mouth daily.     Marland Kitchen escitalopram (LEXAPRO) 20 MG tablet Take 20 mg by mouth daily.    . hydrOXYzine (ATARAX/VISTARIL) 25 MG tablet Take 25 mg by mouth 3 (three) times daily as needed for anxiety.     . metoprolol (LOPRESSOR) 100 MG tablet Take 100 mg by mouth 2 (two) times daily.      . Multiple Vitamin (MULTIVITAMIN) tablet Take 1 tablet by mouth daily.      Marland Kitchen omeprazole (PRILOSEC) 20 MG  capsule Take 20 mg by mouth daily.    . pregabalin (LYRICA) 300 MG capsule Take 300 mg by mouth 2 (two) times daily.    . traZODone (DESYREL) 100 MG tablet Take 200 mg by mouth at bedtime as needed for sleep.     . verapamil (CALAN-SR) 120 MG CR tablet Take 120 mg by mouth daily.     Marland Kitchen acetaminophen (TYLENOL) 500 MG tablet Take 2 tablets (1,000 mg total) by mouth every 8 (eight) hours. (Patient not taking: Reported on 09/18/2017) 30 tablet 0  . docusate sodium (COLACE) 100 MG capsule Take 1 capsule (100 mg total) by mouth 2 (two) times daily. (Patient not taking: Reported on 09/18/2017) 10 capsule 0  . ferrous sulfate (FERROUSUL) 325 (65 FE) MG tablet Take 1 tablet (325 mg total) by mouth 3 (three) times daily with meals. (Patient not taking: Reported on 09/18/2017)  3  . methocarbamol (ROBAXIN) 500 MG tablet Take 1 tablet (500 mg total) by mouth every 6 (six) hours as needed for muscle spasms. (Patient not taking: Reported on 09/18/2017) 40 tablet 0  . oxyCODONE (OXY IR/ROXICODONE) 5 MG immediate release tablet Take 1-2 tablets (5-10 mg total) by mouth every 4 (four) hours as needed for moderate pain or severe pain. (Patient not taking: Reported on 09/18/2017) 60 tablet 0  . polyethylene glycol (MIRALAX / GLYCOLAX) packet Take 17 g by mouth 2 (two) times daily. (Patient not taking: Reported on 09/18/2017) 14 each 0       Review of Systems  Constitutional: Negative.   HENT: Negative.   Eyes: Negative.   Respiratory: Negative.   Cardiovascular: Negative.   Gastrointestinal: Positive for heartburn.  Genitourinary: Negative.   Musculoskeletal: Positive for joint pain.  Skin: Negative.   Neurological: Negative.   Endo/Heme/Allergies: Negative.   Psychiatric/Behavioral: The patient is nervous/anxious.        Physical Exam  Constitutional: He is oriented to person, place, and time. He appears well-developed.  HENT:  Head: Normocephalic.  Eyes: Pupils are equal, round, and reactive to light.    Neck: Neck supple. No JVD present. No tracheal deviation present. No thyromegaly present.  Cardiovascular: Normal rate, regular rhythm and intact distal pulses.  Respiratory: Effort normal and breath sounds normal. No respiratory distress. He has no wheezes.  GI: Soft. There is no tenderness. There is no guarding.  Musculoskeletal:       Left knee: He exhibits decreased range of motion, swelling and bony tenderness. He exhibits no ecchymosis, no deformity, no laceration (healed previous incision) and  no erythema. Tenderness found.  Lymphadenopathy:    He has no cervical adenopathy.  Neurological: He is alert and oriented to person, place, and time.  Skin: Skin is warm and dry.  Psychiatric: He has a normal mood and affect.       Assessment/Plan Assessment:     Failed left UKR   Plan: Patient will undergo a conversion of the left UKR to a TKA on 09/25/2017 [facility]. Risks benefits and expectations were discussed with the patient. Patient understand risks, benefits and expectations and wishes to proceed.    Anastasio AuerbachMatthew S. Shruthi Northrup   PA-C  09/24/2017, 12:08 PM

## 2017-09-24 NOTE — Anesthesia Preprocedure Evaluation (Addendum)
Anesthesia Evaluation  Patient identified by MRN, date of birth, ID band Patient awake    Reviewed: Allergy & Precautions, NPO status , Patient's Chart, lab work & pertinent test results  Airway Mallampati: II  TM Distance: >3 FB Neck ROM: Full    Dental no notable dental hx. (+) Teeth Intact, Dental Advisory Given   Pulmonary neg pulmonary ROS, sleep apnea ,    Pulmonary exam normal breath sounds clear to auscultation       Cardiovascular hypertension, Pt. on medications Normal cardiovascular exam Rhythm:Regular Rate:Normal     Neuro/Psych Anxiety negative neurological ROS     GI/Hepatic GERD  ,  Endo/Other  Morbid obesity  Renal/GU      Musculoskeletal   Abdominal (+) + obese,   Peds  Hematology   Anesthesia Other Findings   Reproductive/Obstetrics negative OB ROS                             Lab Results  Component Value Date   WBC 7.4 09/20/2017   HGB 14.8 09/20/2017   HCT 43.5 09/20/2017   MCV 85.0 09/20/2017   PLT 123 (L) 09/20/2017    Anesthesia Physical Anesthesia Plan  ASA: III  Anesthesia Plan: Spinal   Post-op Pain Management:  Regional for Post-op pain   Induction: Intravenous  PONV Risk Score and Plan: Treatment may vary due to age or medical condition  Airway Management Planned: Mask, Natural Airway and Nasal Cannula  Additional Equipment:   Intra-op Plan:   Post-operative Plan:   Informed Consent: I have reviewed the patients History and Physical, chart, labs and discussed the procedure including the risks, benefits and alternatives for the proposed anesthesia with the patient or authorized representative who has indicated his/her understanding and acceptance.     Plan Discussed with: CRNA  Anesthesia Plan Comments: (w adductor canal block)        Anesthesia Quick Evaluation

## 2017-09-25 ENCOUNTER — Encounter (HOSPITAL_COMMUNITY): Payer: Self-pay | Admitting: *Deleted

## 2017-09-25 ENCOUNTER — Encounter (HOSPITAL_COMMUNITY)
Admission: RE | Disposition: A | Discharge status: Home / Self Care | Payer: Self-pay | Source: Home / Self Care | Attending: Orthopedic Surgery

## 2017-09-25 ENCOUNTER — Other Ambulatory Visit: Payer: Self-pay

## 2017-09-25 ENCOUNTER — Inpatient Hospital Stay (HOSPITAL_COMMUNITY): Payer: No Typology Code available for payment source | Admitting: Anesthesiology

## 2017-09-25 ENCOUNTER — Inpatient Hospital Stay (HOSPITAL_COMMUNITY)
Admission: RE | Admit: 2017-09-25 | Discharge: 2017-09-26 | DRG: 467 | Disposition: A | Discharge status: Home / Self Care | Payer: No Typology Code available for payment source | Attending: Orthopedic Surgery | Admitting: Orthopedic Surgery

## 2017-09-25 DIAGNOSIS — M25062 Hemarthrosis, left knee: Secondary | ICD-10-CM | POA: Diagnosis present

## 2017-09-25 DIAGNOSIS — F17220 Nicotine dependence, chewing tobacco, uncomplicated: Secondary | ICD-10-CM | POA: Diagnosis present

## 2017-09-25 DIAGNOSIS — W540XXA Bitten by dog, initial encounter: Secondary | ICD-10-CM

## 2017-09-25 DIAGNOSIS — T84023A Instability of internal left knee prosthesis, initial encounter: Principal | ICD-10-CM | POA: Diagnosis present

## 2017-09-25 DIAGNOSIS — Z96652 Presence of left artificial knee joint: Secondary | ICD-10-CM

## 2017-09-25 DIAGNOSIS — Z6841 Body Mass Index (BMI) 40.0 and over, adult: Secondary | ICD-10-CM | POA: Diagnosis not present

## 2017-09-25 DIAGNOSIS — Z96659 Presence of unspecified artificial knee joint: Secondary | ICD-10-CM

## 2017-09-25 DIAGNOSIS — G4730 Sleep apnea, unspecified: Secondary | ICD-10-CM | POA: Diagnosis present

## 2017-09-25 DIAGNOSIS — Z79899 Other long term (current) drug therapy: Secondary | ICD-10-CM

## 2017-09-25 DIAGNOSIS — I10 Essential (primary) hypertension: Secondary | ICD-10-CM | POA: Diagnosis present

## 2017-09-25 HISTORY — PX: CONVERSION TO TOTAL KNEE: SHX5785

## 2017-09-25 LAB — TYPE AND SCREEN
ABO/RH(D): A POS
Antibody Screen: NEGATIVE

## 2017-09-25 SURGERY — CONVERSION, ARTHROPLASTY, KNEE, PARTIAL, TO TOTAL KNEE ARTHROPLASTY
Anesthesia: Spinal | Site: Knee | Laterality: Left

## 2017-09-25 MED ORDER — STERILE WATER FOR IRRIGATION IR SOLN
Status: DC | PRN
  Administered 2017-09-25: 2000 mL

## 2017-09-25 MED ORDER — PROPOFOL 10 MG/ML IV BOLUS
INTRAVENOUS | Status: AC
  Filled 2017-09-25: qty 20

## 2017-09-25 MED ORDER — SODIUM CHLORIDE 0.9 % IV SOLN
INTRAVENOUS | Status: DC
  Administered 2017-09-25: 20:00:00 via INTRAVENOUS

## 2017-09-25 MED ORDER — PHENOL 1.4 % MT LIQD
1.0000 | OROMUCOSAL | Status: DC | PRN
  Filled 2017-09-25: qty 177

## 2017-09-25 MED ORDER — FERROUS SULFATE 325 (65 FE) MG PO TABS
325.0000 mg | ORAL_TABLET | Freq: Two times a day (BID) | ORAL | Status: DC
  Administered 2017-09-26: 325 mg via ORAL
  Filled 2017-09-25: qty 1

## 2017-09-25 MED ORDER — MEPERIDINE HCL 50 MG/ML IJ SOLN: 6.2500 mg | INTRAMUSCULAR | Status: DC | PRN

## 2017-09-25 MED ORDER — DOCUSATE SODIUM 100 MG PO CAPS
100.0000 mg | ORAL_CAPSULE | Freq: Two times a day (BID) | ORAL | Status: DC
  Administered 2017-09-25 – 2017-09-26 (×2): 100 mg via ORAL
  Filled 2017-09-25 (×2): qty 1

## 2017-09-25 MED ORDER — METOCLOPRAMIDE HCL 5 MG PO TABS: 5.0000 mg | ORAL_TABLET | Freq: Three times a day (TID) | ORAL | Status: DC | PRN

## 2017-09-25 MED ORDER — DEXAMETHASONE SODIUM PHOSPHATE 10 MG/ML IJ SOLN
10.0000 mg | Freq: Once | INTRAMUSCULAR | Status: AC
  Administered 2017-09-26: 10 mg via INTRAVENOUS
  Filled 2017-09-25: qty 1

## 2017-09-25 MED ORDER — BUPIVACAINE-EPINEPHRINE (PF) 0.25% -1:200000 IJ SOLN
INTRAMUSCULAR | Status: AC
  Filled 2017-09-25: qty 30

## 2017-09-25 MED ORDER — PROPOFOL 10 MG/ML IV BOLUS
INTRAVENOUS | Status: AC
  Filled 2017-09-25: qty 40

## 2017-09-25 MED ORDER — PHENYLEPHRINE HCL 10 MG/ML IJ SOLN
INTRAMUSCULAR | Status: AC
  Filled 2017-09-25: qty 1

## 2017-09-25 MED ORDER — 0.9 % SODIUM CHLORIDE (POUR BTL) OPTIME
TOPICAL | Status: DC | PRN
  Administered 2017-09-25: 1000 mL

## 2017-09-25 MED ORDER — PREGABALIN 100 MG PO CAPS
300.0000 mg | ORAL_CAPSULE | Freq: Two times a day (BID) | ORAL | Status: DC
  Administered 2017-09-25 – 2017-09-26 (×2): 300 mg via ORAL
  Filled 2017-09-25 (×2): qty 3

## 2017-09-25 MED ORDER — KETOROLAC TROMETHAMINE 30 MG/ML IJ SOLN
INTRAMUSCULAR | Status: AC
  Filled 2017-09-25: qty 1

## 2017-09-25 MED ORDER — METHOCARBAMOL 1000 MG/10ML IJ SOLN
500.0000 mg | Freq: Four times a day (QID) | INTRAVENOUS | Status: DC | PRN
  Administered 2017-09-25: 500 mg via INTRAVENOUS
  Filled 2017-09-25: qty 550

## 2017-09-25 MED ORDER — MAGNESIUM CITRATE PO SOLN: 1.0000 | Freq: Once | ORAL | Status: DC | PRN

## 2017-09-25 MED ORDER — TRANEXAMIC ACID 1000 MG/10ML IV SOLN
1000.0000 mg | INTRAVENOUS | Status: AC
  Administered 2017-09-25: 1000 mg via INTRAVENOUS
  Filled 2017-09-25: qty 10

## 2017-09-25 MED ORDER — BUPIVACAINE-EPINEPHRINE (PF) 0.25% -1:200000 IJ SOLN
INTRAMUSCULAR | Status: DC | PRN
  Administered 2017-09-25: 30 mL via PERINEURAL

## 2017-09-25 MED ORDER — AMOXICILLIN-POT CLAVULANATE 875-125 MG PO TABS: 1.0000 | ORAL_TABLET | Freq: Two times a day (BID) | ORAL | Status: DC

## 2017-09-25 MED ORDER — ACETAMINOPHEN 10 MG/ML IV SOLN: 1000.0000 mg | Freq: Once | INTRAVENOUS | Status: DC | PRN

## 2017-09-25 MED ORDER — SODIUM CHLORIDE 0.9 % IJ SOLN
INTRAMUSCULAR | Status: DC | PRN
  Administered 2017-09-25: 30 mL

## 2017-09-25 MED ORDER — METOPROLOL TARTRATE 50 MG PO TABS
100.0000 mg | ORAL_TABLET | Freq: Two times a day (BID) | ORAL | Status: DC
  Administered 2017-09-25 – 2017-09-26 (×2): 100 mg via ORAL
  Filled 2017-09-25 (×2): qty 2

## 2017-09-25 MED ORDER — ACETAMINOPHEN 325 MG PO TABS: 325.0000 mg | ORAL_TABLET | Freq: Four times a day (QID) | ORAL | Status: DC | PRN

## 2017-09-25 MED ORDER — MIDAZOLAM HCL 2 MG/2ML IJ SOLN
1.0000 mg | INTRAMUSCULAR | Status: DC
  Administered 2017-09-25: 2 mg via INTRAVENOUS

## 2017-09-25 MED ORDER — OXYCODONE HCL 5 MG PO TABS
5.0000 mg | ORAL_TABLET | ORAL | Status: DC | PRN
  Administered 2017-09-25: 10 mg via ORAL
  Filled 2017-09-25: qty 2
  Filled 2017-09-25: qty 1

## 2017-09-25 MED ORDER — CHLORHEXIDINE GLUCONATE 4 % EX LIQD: 60.0000 mL | Freq: Once | CUTANEOUS | Status: DC

## 2017-09-25 MED ORDER — HYDROMORPHONE HCL 1 MG/ML IJ SOLN
0.5000 mg | INTRAMUSCULAR | Status: DC | PRN
  Administered 2017-09-26: 0.5 mg via INTRAVENOUS
  Filled 2017-09-25: qty 1

## 2017-09-25 MED ORDER — TRANEXAMIC ACID 1000 MG/10ML IV SOLN
1000.0000 mg | Freq: Once | INTRAVENOUS | Status: AC
  Administered 2017-09-25: 1000 mg via INTRAVENOUS
  Filled 2017-09-25: qty 1100

## 2017-09-25 MED ORDER — BUPIVACAINE IN DEXTROSE 0.75-8.25 % IT SOLN
INTRATHECAL | Status: DC | PRN
  Administered 2017-09-25: 2 mL via INTRATHECAL

## 2017-09-25 MED ORDER — METHOCARBAMOL 500 MG PO TABS
500.0000 mg | ORAL_TABLET | Freq: Four times a day (QID) | ORAL | Status: DC | PRN
  Administered 2017-09-26: 500 mg via ORAL
  Filled 2017-09-25: qty 1

## 2017-09-25 MED ORDER — FENTANYL CITRATE (PF) 100 MCG/2ML IJ SOLN
50.0000 ug | INTRAMUSCULAR | Status: DC
  Administered 2017-09-25: 100 ug via INTRAVENOUS

## 2017-09-25 MED ORDER — KETOROLAC TROMETHAMINE 30 MG/ML IJ SOLN
INTRAMUSCULAR | Status: DC | PRN
  Administered 2017-09-25: 30 mg

## 2017-09-25 MED ORDER — DIPHENHYDRAMINE HCL 12.5 MG/5ML PO ELIX: 12.5000 mg | ORAL_SOLUTION | ORAL | Status: DC | PRN

## 2017-09-25 MED ORDER — EPHEDRINE 5 MG/ML INJ
INTRAVENOUS | Status: AC
  Filled 2017-09-25: qty 10

## 2017-09-25 MED ORDER — LACTATED RINGERS IV SOLN
INTRAVENOUS | Status: DC
  Administered 2017-09-25 (×3): via INTRAVENOUS

## 2017-09-25 MED ORDER — PROMETHAZINE HCL 25 MG/ML IJ SOLN: 6.2500 mg | INTRAMUSCULAR | Status: DC | PRN

## 2017-09-25 MED ORDER — METOCLOPRAMIDE HCL 5 MG/ML IJ SOLN: 5.0000 mg | Freq: Three times a day (TID) | INTRAMUSCULAR | Status: DC | PRN

## 2017-09-25 MED ORDER — ALUM & MAG HYDROXIDE-SIMETH 200-200-20 MG/5ML PO SUSP: 15.0000 mL | ORAL | Status: DC | PRN

## 2017-09-25 MED ORDER — EPHEDRINE SULFATE 50 MG/ML IJ SOLN
INTRAMUSCULAR | Status: DC | PRN
  Administered 2017-09-25: 2.5 mg via INTRAVENOUS

## 2017-09-25 MED ORDER — BISACODYL 10 MG RE SUPP: 10.0000 mg | Freq: Every day | RECTAL | Status: DC | PRN

## 2017-09-25 MED ORDER — DEXAMETHASONE SODIUM PHOSPHATE 10 MG/ML IJ SOLN
10.0000 mg | Freq: Once | INTRAMUSCULAR | Status: AC
  Administered 2017-09-25: 10 mg via INTRAVENOUS

## 2017-09-25 MED ORDER — FENTANYL CITRATE (PF) 100 MCG/2ML IJ SOLN
INTRAMUSCULAR | Status: AC
  Administered 2017-09-25: 100 ug via INTRAVENOUS
  Filled 2017-09-25: qty 2

## 2017-09-25 MED ORDER — SODIUM CHLORIDE 0.9 % IR SOLN
Status: DC | PRN
  Administered 2017-09-25 (×2): 1000 mL

## 2017-09-25 MED ORDER — CEFAZOLIN SODIUM-DEXTROSE 2-4 GM/100ML-% IV SOLN
2.0000 g | Freq: Four times a day (QID) | INTRAVENOUS | Status: AC
  Administered 2017-09-25 – 2017-09-26 (×2): 2 g via INTRAVENOUS
  Filled 2017-09-25 (×2): qty 100

## 2017-09-25 MED ORDER — MIDAZOLAM HCL 2 MG/2ML IJ SOLN
INTRAMUSCULAR | Status: AC
  Administered 2017-09-25: 2 mg via INTRAVENOUS
  Filled 2017-09-25: qty 2

## 2017-09-25 MED ORDER — ESCITALOPRAM OXALATE 20 MG PO TABS
20.0000 mg | ORAL_TABLET | Freq: Every day | ORAL | Status: DC
  Administered 2017-09-26: 20 mg via ORAL
  Filled 2017-09-25: qty 1

## 2017-09-25 MED ORDER — PROPOFOL 500 MG/50ML IV EMUL
INTRAVENOUS | Status: DC | PRN
  Administered 2017-09-25: 75 ug/kg/min via INTRAVENOUS

## 2017-09-25 MED ORDER — ROPIVACAINE HCL 5 MG/ML IJ SOLN
INTRAMUSCULAR | Status: DC | PRN
  Administered 2017-09-25: 30 mL via PERINEURAL

## 2017-09-25 MED ORDER — ONDANSETRON HCL 4 MG PO TABS: 4.0000 mg | ORAL_TABLET | Freq: Four times a day (QID) | ORAL | Status: DC | PRN

## 2017-09-25 MED ORDER — ASPIRIN 81 MG PO CHEW
81.0000 mg | CHEWABLE_TABLET | Freq: Two times a day (BID) | ORAL | Status: DC
  Administered 2017-09-25 – 2017-09-26 (×2): 81 mg via ORAL
  Filled 2017-09-25 (×2): qty 1

## 2017-09-25 MED ORDER — SODIUM CHLORIDE 0.9 % IV SOLN
INTRAVENOUS | Status: DC | PRN
  Administered 2017-09-25: 25 ug/min via INTRAVENOUS

## 2017-09-25 MED ORDER — OXYCODONE HCL 5 MG PO TABS
10.0000 mg | ORAL_TABLET | ORAL | Status: DC | PRN
  Administered 2017-09-26 (×3): 15 mg via ORAL
  Filled 2017-09-25 (×3): qty 3

## 2017-09-25 MED ORDER — PANTOPRAZOLE SODIUM 40 MG PO TBEC
40.0000 mg | DELAYED_RELEASE_TABLET | Freq: Every day | ORAL | Status: DC
  Administered 2017-09-26: 40 mg via ORAL
  Filled 2017-09-25: qty 1

## 2017-09-25 MED ORDER — MENTHOL 3 MG MT LOZG: 1.0000 | LOZENGE | OROMUCOSAL | Status: DC | PRN

## 2017-09-25 MED ORDER — HYDROCODONE-ACETAMINOPHEN 7.5-325 MG PO TABS: 1.0000 | ORAL_TABLET | Freq: Once | ORAL | Status: DC | PRN

## 2017-09-25 MED ORDER — SODIUM CHLORIDE 0.9 % IJ SOLN
INTRAMUSCULAR | Status: AC
  Filled 2017-09-25: qty 50

## 2017-09-25 MED ORDER — VERAPAMIL HCL ER 120 MG PO TBCR
120.0000 mg | EXTENDED_RELEASE_TABLET | Freq: Every day | ORAL | Status: DC
  Administered 2017-09-26: 120 mg via ORAL
  Filled 2017-09-25: qty 1

## 2017-09-25 MED ORDER — ONDANSETRON HCL 4 MG/2ML IJ SOLN
INTRAMUSCULAR | Status: DC | PRN
  Administered 2017-09-25: 4 mg via INTRAVENOUS

## 2017-09-25 MED ORDER — TRAZODONE HCL 100 MG PO TABS
200.0000 mg | ORAL_TABLET | Freq: Every evening | ORAL | Status: DC | PRN
  Administered 2017-09-26: 200 mg via ORAL
  Filled 2017-09-25: qty 2

## 2017-09-25 MED ORDER — POLYETHYLENE GLYCOL 3350 17 G PO PACK
17.0000 g | PACK | Freq: Two times a day (BID) | ORAL | Status: DC
  Administered 2017-09-25 – 2017-09-26 (×2): 17 g via ORAL
  Filled 2017-09-25 (×2): qty 1

## 2017-09-25 MED ORDER — HYDROMORPHONE HCL 1 MG/ML IJ SOLN: 0.2500 mg | INTRAMUSCULAR | Status: DC | PRN

## 2017-09-25 MED ORDER — ONDANSETRON HCL 4 MG/2ML IJ SOLN: 4.0000 mg | Freq: Four times a day (QID) | INTRAMUSCULAR | Status: DC | PRN

## 2017-09-25 MED ORDER — CELECOXIB 200 MG PO CAPS
200.0000 mg | ORAL_CAPSULE | Freq: Two times a day (BID) | ORAL | Status: DC
  Administered 2017-09-25 – 2017-09-26 (×2): 200 mg via ORAL
  Filled 2017-09-25 (×2): qty 1

## 2017-09-25 MED ORDER — HYDROXYZINE HCL 25 MG PO TABS: 25.0000 mg | ORAL_TABLET | Freq: Three times a day (TID) | ORAL | Status: DC | PRN

## 2017-09-25 SURGICAL SUPPLY — 59 items
BAG ZIPLOCK 12X15 (MISCELLANEOUS) ×3 IMPLANT
BANDAGE ACE 6X5 VEL STRL LF (GAUZE/BANDAGES/DRESSINGS) ×3 IMPLANT
BLADE SAW SGTL 11.0X1.19X90.0M (BLADE) IMPLANT
BLADE SAW SGTL 13.0X1.19X90.0M (BLADE) ×3 IMPLANT
BOWL SMART MIX CTS (DISPOSABLE) ×3 IMPLANT
CAP KNEE TOTAL 3 SIGMA ×3 IMPLANT
CEMENT HV SMART SET (Cement) ×6 IMPLANT
COVER SURGICAL LIGHT HANDLE (MISCELLANEOUS) ×3 IMPLANT
CUFF TOURN SGL QUICK 34 (TOURNIQUET CUFF) ×2
CUFF TRNQT CYL 34X4X40X1 (TOURNIQUET CUFF) ×1 IMPLANT
DECANTER SPIKE VIAL GLASS SM (MISCELLANEOUS) ×6 IMPLANT
DERMABOND ADVANCED (GAUZE/BANDAGES/DRESSINGS) ×4
DERMABOND ADVANCED .7 DNX12 (GAUZE/BANDAGES/DRESSINGS) ×2 IMPLANT
DRAPE U-SHAPE 47X51 STRL (DRAPES) ×3 IMPLANT
DRESSING AQUACEL AG SP 3.5X10 (GAUZE/BANDAGES/DRESSINGS) IMPLANT
DRSG AQUACEL AG ADV 3.5X10 (GAUZE/BANDAGES/DRESSINGS) IMPLANT
DRSG AQUACEL AG ADV 3.5X14 (GAUZE/BANDAGES/DRESSINGS) ×3 IMPLANT
DRSG AQUACEL AG SP 3.5X10 (GAUZE/BANDAGES/DRESSINGS)
DRSG TEGADERM 4X4.75 (GAUZE/BANDAGES/DRESSINGS) IMPLANT
DURAPREP 26ML APPLICATOR (WOUND CARE) ×6 IMPLANT
ELECT REM PT RETURN 15FT ADLT (MISCELLANEOUS) ×3 IMPLANT
GLOVE BIOGEL M 7.0 STRL (GLOVE) IMPLANT
GLOVE BIOGEL PI IND STRL 7.0 (GLOVE) ×1 IMPLANT
GLOVE BIOGEL PI IND STRL 7.5 (GLOVE) ×3 IMPLANT
GLOVE BIOGEL PI IND STRL 8.5 (GLOVE) ×1 IMPLANT
GLOVE BIOGEL PI IND STRL 9 (GLOVE) ×1 IMPLANT
GLOVE BIOGEL PI INDICATOR 7.0 (GLOVE) ×2
GLOVE BIOGEL PI INDICATOR 7.5 (GLOVE) ×6
GLOVE BIOGEL PI INDICATOR 8.5 (GLOVE) ×2
GLOVE BIOGEL PI INDICATOR 9 (GLOVE) ×2
GLOVE ECLIPSE 8.0 STRL XLNG CF (GLOVE) ×3 IMPLANT
GLOVE ORTHO TXT STRL SZ7.5 (GLOVE) ×6 IMPLANT
GLOVE SURG SS PI 7.5 STRL IVOR (GLOVE) ×3 IMPLANT
GOWN SPEC L3 XXLG W/TWL (GOWN DISPOSABLE) ×3 IMPLANT
GOWN STRL REUS W/TWL LRG LVL3 (GOWN DISPOSABLE) ×6 IMPLANT
GOWN STRL REUS W/TWL XL LVL3 (GOWN DISPOSABLE) ×3 IMPLANT
HANDPIECE INTERPULSE COAX TIP (DISPOSABLE) ×2
MANIFOLD NEPTUNE II (INSTRUMENTS) ×3 IMPLANT
NS IRRIG 1000ML POUR BTL (IV SOLUTION) ×6 IMPLANT
PACK TOTAL KNEE CUSTOM (KITS) ×3 IMPLANT
POSITIONER SURGICAL ARM (MISCELLANEOUS) ×3 IMPLANT
SET HNDPC FAN SPRY TIP SCT (DISPOSABLE) ×1 IMPLANT
SET PAD KNEE POSITIONER (MISCELLANEOUS) ×3 IMPLANT
SPONGE LAP 18X18 X RAY DECT (DISPOSABLE) ×3 IMPLANT
SUT MNCRL AB 3-0 PS2 18 (SUTURE) ×3 IMPLANT
SUT MNCRL AB 4-0 PS2 18 (SUTURE) ×3 IMPLANT
SUT MON AB 2-0 CT1 36 (SUTURE) ×3 IMPLANT
SUT VIC AB 1 CT1 36 (SUTURE) ×6 IMPLANT
SUT VIC AB 2-0 CT1 27 (SUTURE) ×8
SUT VIC AB 2-0 CT1 TAPERPNT 27 (SUTURE) ×4 IMPLANT
SUT VLOC 180 0 24IN GS25 (SUTURE) ×3 IMPLANT
TRAY FOLEY MTR SLVR 16FR STAT (SET/KITS/TRAYS/PACK) ×3 IMPLANT
TRAY SLEEVE CEM ML (Knees) ×3 IMPLANT
TRAY TIB SZ 5 REVISION (Knees) ×3 IMPLANT
WATER STERILE IRR 1000ML POUR (IV SOLUTION) ×3 IMPLANT
WEDGE SZ 5 5MM (Knees) ×3 IMPLANT
WEDGE SZ 5 5MM 10MM (Knees) ×3 IMPLANT
WRAP KNEE MAXI GEL POST OP (GAUZE/BANDAGES/DRESSINGS) ×3 IMPLANT
YANKAUER SUCT BULB TIP 10FT TU (MISCELLANEOUS) IMPLANT

## 2017-09-25 NOTE — Transfer of Care (Signed)
Immediate Anesthesia Transfer of Care Note  Patient: Thomas [name]  Procedure(s) Performed: Left knee conversion from uni compartmental arthroplasty to total knee arthroplasty (Left Knee)  Patient Location: PACU  Anesthesia Type:MAC and Spinal  Level of Consciousness: awake, alert  and oriented  Airway & Oxygen Therapy: Patient Spontanous Breathing and Patient connected to face mask oxygen  Post-op Assessment: Report given to RN and Post -op Vital signs reviewed and stable  Post vital signs: Reviewed and stable  Last Vitals:  Vitals Value Taken Time  BP 112/69 09/25/2017  6:21 PM  Temp 36.8 C 09/25/2017  6:21 PM  Pulse 65 09/25/2017  6:23 PM  Resp 7 09/25/2017  6:23 PM  SpO2 100 % 09/25/2017  6:23 PM  Vitals shown include unvalidated device data.  Last Pain:  Vitals:   09/25/17 1418  TempSrc: Oral         Complications: No apparent anesthesia complications

## 2017-09-25 NOTE — Brief Op Note (Addendum)
09/25/2017  3:03 PM  PATIENT:  Thomas [name]  47 y.o. 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  PROCEDURE:  Procedure(s) with comments: Left knee conversion from uni compartmental arthroplasty to total knee arthroplasty (Left) - 90 mins  SURGEON:  Surgeon(s) and Role:    Durene Romans* Catilyn Boggus, MD - Primary  PHYSICIAN ASSISTANT: Lanney GinsMatthew Babish, PA-C  ANESTHESIA:   regional and spinal  EBL:  200 cc  BLOOD ADMINISTERED:none  DRAINS: none   LOCAL MEDICATIONS USED:  MARCAINE     SPECIMEN:  No Specimen  DISPOSITION OF SPECIMEN:  N/A  COUNTS:  YES  TOURNIQUET:  61 min at 250 mmHg  DICTATION: .Other Dictation: Dictation Number [id number]  PLAN OF CARE: Admit to inpatient   PATIENT DISPOSITION:  PACU - hemodynamically stable.   Delay start of Pharmacological VTE agent (>24hrs) due to surgical blood loss or risk of bleeding: no

## 2017-09-25 NOTE — Anesthesia Postprocedure Evaluation (Signed)
Anesthesia Post Note  Patient: Almon Herculesric Houchins  Procedure(s) Performed: Left knee conversion from uni compartmental arthroplasty to total knee arthroplasty (Left Knee)     Patient location during evaluation: PACU Anesthesia Type: Spinal Level of consciousness: oriented and awake and alert Pain management: pain level controlled Vital Signs Assessment: post-procedure vital signs reviewed and stable Respiratory status: spontaneous breathing, respiratory function stable and patient connected to nasal cannula oxygen Cardiovascular status: blood pressure returned to baseline and stable Postop Assessment: no headache, no backache and no apparent nausea or vomiting Anesthetic complications: no    Last Vitals:  Vitals:   09/25/17 1458 09/25/17 1821  BP: 128/86 112/69  Pulse:  68  Resp:  (!) 9  Temp:  36.8 C  SpO2:  100%    Last Pain:  Vitals:   09/25/17 1821  TempSrc:   PainSc: (P) 0-No pain                 Trevor IhaStephen A Gabriellia Rempel

## 2017-09-25 NOTE — Progress Notes (Signed)
AssistedDr. Houser with left, ultrasound guided, adductor canal block. Side rails up, monitors on throughout procedure. See vital signs in flow sheet. Tolerated Procedure well.  

## 2017-09-25 NOTE — Interval H&P Note (Signed)
History and Physical Interval Note:  09/25/2017 2:28 PM  Thomas [name]  has presented today for surgery, with the diagnosis of Failed left uni compartmental knee  The various methods of treatment have been discussed with the patient and family. After consideration of risks, benefits and other options for treatment, the patient has consented to  Procedure(s) with comments: Left knee conversion from uni compartmental arthroplasty to total knee arthroplasty (Left) - 90 mins as a surgical intervention .  The patient's history has been reviewed, patient examined, no change in status, stable for surgery.  I have reviewed the patient's chart and labs.  Questions were answered to the patient's satisfaction.     Shelda PalMatthew D Kastiel Simonian

## 2017-09-25 NOTE — Anesthesia Procedure Notes (Signed)
Spinal  Patient location during procedure: OR Start time: 09/25/2017 3:24 PM End time: 09/25/2017 3:30 PM Staffing Anesthesiologist: Trevor IhaHouser, [name] A, MD Spinal Block Patient position: sitting Prep: DuraPrep Patient monitoring: heart rate, cardiac monitor, continuous pulse ox and blood pressure Approach: midline Location: L3-4 Injection technique: single-shot Needle Needle type: Pencan  Needle length: 9 cm Needle insertion depth: 7 cm Assessment Sensory level: T4

## 2017-09-25 NOTE — Anesthesia Procedure Notes (Addendum)
Anesthesia Regional Block: Adductor canal block   Pre-Anesthetic Checklist: ,, timeout performed, Correct Patient, Correct Site, Correct Laterality, Correct Procedure, Correct Position, site marked, Risks and benefits discussed,  Surgical consent,  Pre-op evaluation,  At surgeon's request and post-op pain management  Laterality: Left  Prep: Maximum Sterile Barrier Precautions used, chloraprep       Needles:  Injection technique: Single-shot  Needle Type: Echogenic Needle     Needle Length: 9cm  Needle Gauge: 21     Additional Needles:   Procedures:,,,, ultrasound used (permanent image in chart),,,,  Narrative:  Start time: 09/25/2017 2:45 PM End time: 09/25/2017 2:55 PM Injection made incrementally with aspirations every 5 mL.  Performed by: Personally  Anesthesiologist: Trevor IhaHouser, [name] A, MD

## 2017-09-25 NOTE — Anesthesia Procedure Notes (Signed)
Date/Time: 09/25/2017 3:38 PM Performed by: Thornell MuleStubblefield, Zayden Maffei G, CRNA Oxygen Delivery Method: Simple face mask

## 2017-09-25 NOTE — Anesthesia Procedure Notes (Signed)
Date/Time: 09/25/2017 3:21 PM Performed by: Thornell MuleStubblefield, Unita Detamore G, CRNA Oxygen Delivery Method: Nasal cannula

## 2017-09-26 ENCOUNTER — Encounter (HOSPITAL_COMMUNITY): Payer: Self-pay

## 2017-09-26 LAB — CBC
HCT: 36.4 % — ABNORMAL LOW (ref 39.0–52.0)
Hemoglobin: 12.4 g/dL — ABNORMAL LOW (ref 13.0–17.0)
MCH: 29 pg (ref 26.0–34.0)
MCHC: 34.1 g/dL (ref 30.0–36.0)
MCV: 85.2 fL (ref 78.0–100.0)
Platelets: 120 10*3/uL — ABNORMAL LOW (ref 150–400)
RBC: 4.27 MIL/uL (ref 4.22–5.81)
RDW: 14.1 % (ref 11.5–15.5)
WBC: 12.1 10*3/uL — ABNORMAL HIGH (ref 4.0–10.5)

## 2017-09-26 LAB — BASIC METABOLIC PANEL
ANION GAP: 7 (ref 5–15)
BUN: 15 mg/dL (ref 6–20)
CALCIUM: 8.5 mg/dL — AB (ref 8.9–10.3)
CO2: 28 mmol/L (ref 22–32)
CREATININE: 0.86 mg/dL (ref 0.61–1.24)
Chloride: 103 mmol/L (ref 98–111)
GFR calc non Af Amer: >60 mL/min (ref >60)
GLUCOSE: 203 mg/dL — AB (ref 70–99)
Potassium: 4.2 mmol/L (ref 3.5–5.1)
Sodium: 138 mmol/L (ref 135–145)

## 2017-09-26 MED ORDER — ASPIRIN 81 MG PO CHEW: 81.0000 mg | CHEWABLE_TABLET | Freq: Two times a day (BID) | ORAL | 0 refills | Status: AC

## 2017-09-26 MED ORDER — KETOROLAC TROMETHAMINE 15 MG/ML IJ SOLN
7.5000 mg | Freq: Four times a day (QID) | INTRAMUSCULAR | Status: DC
  Administered 2017-09-26: 7.5 mg via INTRAVENOUS
  Filled 2017-09-26: qty 1

## 2017-09-26 MED ORDER — METHOCARBAMOL 500 MG PO TABS: 500.0000 mg | ORAL_TABLET | Freq: Four times a day (QID) | ORAL | 0 refills | Status: DC | PRN

## 2017-09-26 MED ORDER — OXYCODONE HCL 5 MG PO TABS: 5.0000 mg | ORAL_TABLET | ORAL | 0 refills | Status: DC | PRN

## 2017-09-26 MED ORDER — POLYETHYLENE GLYCOL 3350 17 G PO PACK: 17.0000 g | PACK | Freq: Two times a day (BID) | ORAL | 0 refills | Status: DC

## 2017-09-26 MED ORDER — FERROUS SULFATE 325 (65 FE) MG PO TABS: 325.0000 mg | ORAL_TABLET | Freq: Three times a day (TID) | ORAL | 3 refills | Status: DC

## 2017-09-26 MED ORDER — DOCUSATE SODIUM 100 MG PO CAPS: 100.0000 mg | ORAL_CAPSULE | Freq: Two times a day (BID) | ORAL | 0 refills | Status: DC

## 2017-09-26 NOTE — Progress Notes (Signed)
Physical Therapy Treatment Patient Details Name: Thomas [name] MRN: [id number] DOB: 1970/03/28 Today's Date: 09/26/2017    History of Present Illness Pt s/p LTKR - revision from UKR    PT Comments    Pt with increased mobility this session with crutch use. Pt tolerated mobility, knee AROM and AAROM, and exercises well. Pt required some verbal cuing to recall exercises and stair navigation, which improved with practice and written handouts of exercises. Pt with plans to attend OPPT associated with MD's office upon d/c. Pt had no further questions for PT prior to d/c.    Follow Up Recommendations  Outpatient PT     Equipment Recommendations  None recommended by PT    Recommendations for Other Services       Precautions / Restrictions Precautions Precautions: Knee;Fall Restrictions Weight Bearing Restrictions: No Other Position/Activity Restrictions: WBAT    Mobility  Bed Mobility Overal bed mobility: Modified Independent             General bed mobility comments: Unassisted to EOB  Transfers Overall transfer level: Modified independent Equipment used: Rolling walker (2 wheeled);Crutches Transfers: Sit to/from Stand Sit to Stand: Supervision         General transfer comment: verbal cuing for hand placement during sit to stand. Stand to sit performed with crutches, and verbal cuing for crutch positioning during lowering.   Ambulation/Gait Ambulation/Gait assistance: Supervision;Min guard Gait Distance (Feet): 160 Feet Assistive device: Crutches Gait Pattern/deviations: Step-to pattern;Step-through pattern;Decreased step length - left;Decreased stance time - left Gait velocity: decreased    General Gait Details: cues for posture, position from RW and initial sequence   Stairs Stairs: Yes Stairs assistance: Supervision;Min guard Stair Management: With crutches Number of Stairs: 1 General stair comments: Pt with verbal cuing for ascending with RLE first,  descending with LLE first. Min guard when transitioning onto and off of step    Wheelchair Mobility    Modified Rankin (Stroke Patients Only)       Balance Overall balance assessment: No apparent balance deficits (not formally assessed)                                          Cognition Arousal/Alertness: Awake/alert Behavior During Therapy: WFL for tasks assessed/performed Overall Cognitive Status: Within Functional Limits for tasks assessed                                        Exercises Total Joint Exercises Ankle Circles/Pumps: AROM;Both;20 reps;Seated Quad Sets: AROM;Both;15 reps;Seated Heel Slides: AAROM;Left;Seated;10 reps Hip ABduction/ADduction: AROM;Left;10 reps;Seated Straight Leg Raises: AROM;Left;15 reps;Seated Knee Flexion: AAROM;Left;10 reps(Seated with towel under L foot, sliding foot with increased knee flexion)    General Comments        Pertinent Vitals/Pain Pain Assessment: 0-10 Pain Score:  Not rated  Pain Location: L knee Pain Descriptors / Indicators: Aching;Sore Pain Intervention(s): Limited activity within patient's tolerance;Monitored during session;Premedicated before session;Ice applied    Home Living Family/patient expects to be discharged to:: Private residence Living Arrangements: Spouse/significant other Available Help at Discharge: Family Type of Home: House Home Access: Stairs to enter Entrance Stairs-Rails: None Home Layout: One level Home Equipment: Environmental consultant - 2 wheels;Crutches      Prior Function Level of Independence: Independent with assistive device(s)      Comments: using crutches  prior to this surgery   PT Goals (current goals can now be found in the care plan section) Acute Rehab PT Goals Patient Stated Goal: Regain IND PT Goal Formulation: With patient Time For Goal Achievement: 09/27/17 Potential to Achieve Goals: Good Progress towards PT goals: Progressing toward goals     Frequency    BID      PT Plan Current plan remains appropriate    Co-evaluation              AM-PAC PT "6 Clicks" Daily Activity  Outcome Measure  Difficulty turning over in bed (including adjusting bedclothes, sheets and blankets)?: A Little Difficulty moving from lying on back to sitting on the side of the bed? : A Little Difficulty sitting down on and standing up from a chair with arms (e.g., wheelchair, bedside commode, etc,.)?: A Little Help needed moving to and from a bed to chair (including a wheelchair)?: A Little Help needed walking in hospital room?: A Little Help needed climbing 3-5 steps with a railing? : A Little 6 Click Score: 18    End of Session Equipment Utilized During Treatment: Gait belt Activity Tolerance: Patient tolerated treatment well Patient left: in chair;Other (comment)(nurse notified of condition and cleared for dc ) Nurse Communication: Mobility status PT Visit Diagnosis: Difficulty in walking, not elsewhere classified (R26.2)     Time: [id number] PT Time Calculation (min) (ACUTE ONLY): 30 min  Charges:  $Gait Training: 8-22 mins $Therapeutic Exercise: 8-22 mins                    G Codes:        Sathvik Tiedt D Arjun Hard PT DPT 09/26/2017, 12:34 PM

## 2017-09-26 NOTE — Progress Notes (Signed)
Patient ID: Thomas [name], male   DOB: 12/17/1970, 47 y.o.   MRN: [id number] Subjective: 1 Day Post-Op Procedure(s) (LRB): Left knee conversion from uni compartmental arthroplasty to total knee arthroplasty (Left)    Patient reports pain as moderate.  No events.  Feels more sore than previous procedures  Objective:   VITALS:   Vitals:   09/26/17 0129 09/26/17 0549  BP: 128/75 112/73  Pulse: 97 84  Resp: 16 16  Temp: 98.2 F (36.8 C) (!) 97.4 F (36.3 C)  SpO2: 94% 96%    Neurovascular intact Incision: dressing C/D/I  LABS Recent Labs    09/26/17 0600  HGB 12.4*  HCT 36.4*  WBC 12.1*  PLT 120*    Recent Labs    09/26/17 0600  NA 138  K 4.2  BUN 15  CREATININE 0.86  GLUCOSE 203*    No results for input(s): LABPT, INR in the last 72 hours.   Assessment/Plan: 1 Day Post-Op Procedure(s) (LRB): Left knee conversion from uni compartmental arthroplasty to total knee arthroplasty (Left)   Advance diet Up with therapy  Would like to go home today after therapy Will give a dose or two of Toradol to help with pain otherwise already on oxycodone 5-15 mg RTC in 2 weeks Out patient PT Goals discussed

## 2017-09-26 NOTE — Op Note (Signed)
Thomas [name], Thomas [name] MEDICAL RECORD [id number] ACCOUNT [id number] DATE OF BIRTH:Sep 15, 1970 FACILITY: [facility] LOCATION: [facility] PHYSICIAN:Drevion Offord D. 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    PROCEDURES:   1.  Removal of deep implant. 2.  Conversion of failed left partial knee arthroplasty to total knee arthroplasty.  COMPONENTS USED:  A DePuy total knee system with a size 5 left posterior stabilized femoral component, a size 5 MBT revision tray with a 29 cemented sleeve 5 mm distal lateral augment, 10 mm medial wedge 12.5 posterior stabilized insert and a 41 patellar  button.  SURGEON:  Madlyn FrankelMatthew D. Charlann Boxerlin, MD.    ASSISTANLanney Gins:  Agamjot Kilgallon Babish PA-C.  Note that the Mr. Carmon SailsBabish was present for the entirety of the case in preoperative position, perioperative management of the operative extremity, general facilitation of the case and primary wound closure.  ANESTHESIA:  Regional plus spinal block.  LOSS:  None taken.  COMPLICATIONS:  None evident.  Tourniquet was utilized for 96 minutes at 250 mmHg.  DRAINS:  None.  INDICATIONS:  The patient is a 47 year old male with history of left partial knee arthroplasty.  He initially recovered well, but sustained a periprosthetic fracture on the medial proximal tibia.  He was taken to the operating room and had an open  reduction internal fixation with a plate with limited weightbearing postoperatively.  However, as time progressed and his bone healed, there was evidence of increasing the gap medially.  This led to dislocation of the polyethylene.  We used the largest  size polyethylene insert for the Oxford  knee system, but still there was evidence of instability.  Last week he had presented to the office with recurrent dislocation of the polyethylene.  For that reason, it was indicated to proceed with surgery.  I  felt that we had waited long enough to try to  get bone healing that we either maintained his medial base plate or be able to remove it based on intraoperative findings.  Risks, benefits and necessity of the procedure were discussed and reviewed at length.  Risk of infection, DVT, component failure, need for future surgeries were all discussed.  Reviewed consent was obtained.  DESCRIPTION OF PROCEDURE:  The patient was brought to the operative theater.  Once adequate anesthesia, preoperative antibiotics, Ancef administered as well as tranexamic acid and Decadron, he was positioned supine.  A left thigh tourniquet was placed.   Left lower extremity was then prepped and draped in sterile fashion.  Timeout was performed identifying the patient, the planned procedure and extremity.  The patient's old incision was excised and extended proximally for a total knee arthroplasty  procedure.  Soft tissue planes created.  Median arthrotomy made encountering hemarthrosis.  No signs of infection.  Following initial exposure including synovectomy, medial and laterally.  Exposure of the knee was obtained.  I then focused on the  patella.  Precut measurement of the patella was about 24 mm, resected down to 14 mm and used a 41 patellar button.  Lug holes were drilled and a metal shim was placed to protect the patella from retractors and saw blades.  The patella was then subluxated laterally and attention was now directed on the knee replacement.  The attention was first directed  onto the tibia.  Using extramedullary guide measured, I made a resection of the proximal tibia, about based 8 mm off the  lateral proximal tibia.  This resulted in a defect on this medial side of the knee from the malunion.  The prior  placed tibial component was removed without challenge.  At this point, I assessed the cut surface to make sure it was perpendicular in the coronal plane and then also looked at the size of the component.  We selected the size 5 tibial tray.  Given the orientation, we pinned it based on the medial 3rd tibial  tubercle then drilled  I then used 29 cemented broach sleeve and broached for this.  At this point, I determined I was going to use at least a 5 mm augment on this medial side, though there was still some posterior slope to 5 mm, seemed adequate for  contact purposes.  The cut was made appropriately for this.  At this point, we placed the trial tray to then assess ligament balancing.  Attention was now directed to the femur.  The femoral canal was opened with a drill, irrigated to try to prevent fat emboli.  Using the intramedullary rod at 5 degrees of valgus, a 10 mm bone resection line was made.  Lateral bone was removed off the  distal femur.  I then removed the distal femoral component with some bone loss on the lateral aspect of the distal medial femoral condyle, but not significantly affecting the distal cut.  I then finalized the distal cut medially.  I then sized the femur  to be a size 5.  A size 5 rotation block was then pinned into place based off the proximal tibial cut and the tray in place.  I then made anterior, posterior and chamfer cuts without difficulty or notching.  Final box cut was made off the lateral aspect  of the distal femur.  We did a trial reduction with a 5 femur and the tibial tray in place.  With this setup found that the knee was loose in both extension and in flexion even with a 12.5 insert.  For that reason, I selected to increase augments on the  proximal tibia, but nothing on the femur.  Given these findings,  all the trial components were removed.  Final preparation of all bone debridement carried out as necessary.  We irrigated the knee with normal saline  solution.  Final components were  opened and configured on the back table under my direct supervision.  The knee was injected with Marcaine, Toradol and saline.  Then, cement was mixed.  Final components were then cemented into place with the knee brought to extension with a 12.5 insert.   The knee was kept in extension until the cement fully cured.  Any excess cement was removed.  Based on the trial reduction and stability of the components, the final 12.5 insert was selected and placed into the knee.  The knee was irrigated again.  The tourniquet had been let down after 61 minutes.  Tourniquet was up for 61 minutes.  there was some lateral bleeding at the lateral geniculate branch which was cauterized.  The extensor mechanism was then reapproximated using #1 Vicryl and #1 Stratafix suture.  The remainder of the wound was closed with 2-0 Vicryl and a running Monocryl stitch.  The knee was then cleaned, dried and dressed sterilely using surgical glue and  Aquacel dressing.    He was then brought to the  recovery room in stable condition, tolerating the procedure well.  Findings were reviewed with the family.    PLAN:  He will work on motion and strength in the postoperative period to maximize the overall result on patient indicated.  AN/NUANCE  D:09/26/2017 T:09/26/2017 JOB:[id number]

## 2017-09-26 NOTE — Evaluation (Signed)
Physical Therapy Evaluation Patient Details Name: Thomas [name] MRN: [id number] DOB: 04/08/1970 Today's Date: 09/26/2017   History of Present Illness  Pt s/p LTKR - revision from UKR  Clinical Impression  Pt s/p L TKR and presents with decreased L LE strength/ROM and post op pain limiting functional mobility.  Pt should progress to dc home with family assist.    Follow Up Recommendations Follow surgeon's recommendation for DC plan and follow-up therapies    Equipment Recommendations  None recommended by PT    Recommendations for Other Services       Precautions / Restrictions Precautions Precautions: Knee;Fall Restrictions Weight Bearing Restrictions: No Other Position/Activity Restrictions: WBAT      Mobility  Bed Mobility Overal bed mobility: Modified Independent             General bed mobility comments: Unassisted to EOB  Transfers Overall transfer level: Needs assistance   Transfers: Sit to/from Stand Sit to Stand: Min guard;Supervision         General transfer comment: cues for safe transition position, LE management and use of UEs to self assist  Ambulation/Gait Ambulation/Gait assistance: Min guard;Supervision Gait Distance (Feet): 140 Feet Assistive device: Rolling walker (2 wheeled) Gait Pattern/deviations: Step-to pattern;Step-through pattern;Decreased step length - right;Decreased step length - left;Shuffle Gait velocity: decr   General Gait Details: cues for posture, position from RW and initial sequence  Stairs            Wheelchair Mobility    Modified Rankin (Stroke Patients Only)       Balance Overall balance assessment: No apparent balance deficits (not formally assessed)                                           Pertinent Vitals/Pain Pain Assessment: 0-10 Pain Score: 7  Pain Location: L knee Pain Descriptors / Indicators: Aching;Sore Pain Intervention(s): Limited activity within patient's  tolerance;Monitored during session;Premedicated before session;Ice applied    Home Living Family/patient expects to be discharged to:: Private residence Living Arrangements: Spouse/significant other Available Help at Discharge: Family Type of Home: House Home Access: Stairs to enter Entrance Stairs-Rails: None Secretary/administrator of Steps: 1 Home Layout: One level Home Equipment: Environmental consultant - 2 wheels;Crutches      Prior Function Level of Independence: Independent with assistive device(s)         Comments: using crutches prior to this surgery     Hand Dominance        Extremity/Trunk Assessment   Upper Extremity Assessment Upper Extremity Assessment: Overall WFL for tasks assessed    Lower Extremity Assessment Lower Extremity Assessment: LLE deficits/detail LLE Deficits / Details: 3/5 quads with IND SLR and AAROM at L knee -5 - 75    Cervical / Trunk Assessment Cervical / Trunk Assessment: Normal  Communication   Communication: No difficulties  Cognition Arousal/Alertness: Awake/alert Behavior During Therapy: WFL for tasks assessed/performed Overall Cognitive Status: Within Functional Limits for tasks assessed                                        General Comments      Exercises Total Joint Exercises Ankle Circles/Pumps: AROM;Both;15 reps;Supine Quad Sets: AROM;Both;15 reps;Supine Heel Slides: AAROM;Left;15 reps;Supine Straight Leg Raises: AROM;Left;15 reps;Supine   Assessment/Plan    PT Assessment Patient needs  continued PT services  PT Problem List Decreased range of motion;Decreased strength;Decreased activity tolerance;Decreased mobility;Pain;Decreased knowledge of use of DME       PT Treatment Interventions DME instruction;Gait training;Stair training;Functional mobility training;Therapeutic activities;Therapeutic exercise;Patient/family education    PT Goals (Current goals can be found in the Care Plan section)  Acute Rehab PT  Goals Patient Stated Goal: Regain IND PT Goal Formulation: With patient Time For Goal Achievement: 09/27/17 Potential to Achieve Goals: Good    Frequency 7X/week   Barriers to discharge        Co-evaluation               AM-PAC PT "6 Clicks" Daily Activity  Outcome Measure Difficulty turning over in bed (including adjusting bedclothes, sheets and blankets)?: A Little Difficulty moving from lying on back to sitting on the side of the bed? : A Little Difficulty sitting down on and standing up from a chair with arms (e.g., wheelchair, bedside commode, etc,.)?: A Little Help needed moving to and from a bed to chair (including a wheelchair)?: A Little Help needed walking in hospital room?: A Little Help needed climbing 3-5 steps with a railing? : A Little 6 Click Score: 18    End of Session Equipment Utilized During Treatment: Gait belt Activity Tolerance: Patient tolerated treatment well Patient left: in chair;with call bell/phone within reach Nurse Communication: Mobility status PT Visit Diagnosis: Difficulty in walking, not elsewhere classified (R26.2)    Time: [id number] PT Time Calculation (min) (ACUTE ONLY): 30 min   Charges:   PT Evaluation $PT Eval Low Complexity: 1 Low PT Treatments $Gait Training: 8-22 mins   PT G Codes:        Pg [phone]   Byanka Landrus 09/26/2017, 9:12 AM

## 2017-09-26 NOTE — Progress Notes (Signed)
Patient discharged home w/ family. Given all belongings, instructions, prescriptions. Verbalized understanding of instructions. Escorted to pov via w/c.

## 2017-10-01 NOTE — Discharge Summary (Signed)
Physician Discharge Summary  Patient ID: Thomas [name] MRN: [id number] DOB/AGE: [birth year] 47 y.o.  Admit date: 09/25/2017 Discharge date: 09/26/2017   Procedures:  Procedure(s) (LRB): Left knee conversion from uni compartmental arthroplasty to total knee arthroplasty (Left)  Attending Physician:  Dr. Durene Romans   Admission Diagnoses:   Failed left UKR   Discharge Diagnoses:  Active Problems:   S/P revision of total knee, left   S/P total knee replacement  Past Medical History:  Diagnosis Date  . Anxiety    occasionally  . Arthritis   . Chest pain   . GERD (gastroesophageal reflux disease)   . HTN (hypertension)   . Pre-diabetes   . Sleep apnea    cpap   . SVT (supraventricular tachycardia) (HCC)     HPI:    Pt is a 47 y.o. male complaining of left knee pain .  Thomas [name] presented to the office 2 months out from left uni knee arthroplasty with poly revision, DOS: 07/16/17. 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 He presented recently to the clinic wearing a knee brace and using a crutch. He states his knee has been feeling like the knee has been popping out of place, and he could barely put weight on it. He takes Tramadol as needed for pain. A dog bit him on the left medial thigh 2 weeks ago. He has bruising and a laceration. He had not been taking antibiotics, but was placed on some in the clinic.  He has not been running fevers or having constitutional symptoms.  Discussion was had with the patient and Dr. Charlann Boxer that he will undergo a conversion of the left UKR to a TKA.  Risks, benefits and expectations were discussed with the patient.  Risks including but not limited to the risk of anesthesia, blood clots, nerve damage, blood vessel damage, failure of the prosthesis, infection and up to and including death.  Patient understand  the risks, benefits and expectations and wishes to proceed with surgery.   PCP: Sondra Come, MD   Discharged Condition: good  Hospital Course:  Patient underwent the above stated procedure on 09/25/2017. Patient tolerated the procedure well and brought to the recovery room in good condition and subsequently to the floor.  POD #1 BP: 112/73 ; Pulse: 84 ; Temp: 97.4 F (36.3 C) ; Resp: 16 Patient reports pain as moderate.  No events.  Feels more sore than previous procedures. Ready to be discharge home. Neurovascular intact and incision: dressing C/D/I.   LABS  Basename    HGB     12.4  HCT     36.4    Discharge Exam: General appearance: alert, cooperative and no distress Extremities: Homans sign is negative, no sign of DVT, no edema, redness or tenderness in the calves or thighs and no ulcers, gangrene or trophic changes  Disposition:  Home with follow up in 2 weeks   Follow-up Information    Durene Romans, MD. Schedule an appointment as soon as possible for a visit in 2 weeks.   Specialty:  Orthopedic Surgery Contact information: [address] [place] [address] [phone]           Discharge Instructions    Call MD / Call 911   Complete by:  As directed    If you experience chest pain or shortness of breath, CALL 911 and  be transported to the hospital emergency room.  If you develope a fever above 101 F, pus (white drainage) or increased drainage or redness at the wound, or calf pain, call your surgeon's office.   Change dressing   Complete by:  As directed    Maintain surgical dressing until follow up in the clinic. If the edges start to pull up, may reinforce with tape. If the dressing is no longer working, may remove and cover with gauze and tape, but must keep the area dry and clean.  Call with any questions or concerns.   Constipation Prevention   Complete by:  As directed    Drink plenty of fluids.  Prune juice may be helpful.  You may use  a stool softener, such as Colace (over the counter) 100 mg twice a day.  Use MiraLax (over the counter) for constipation as needed.   Diet - low sodium heart healthy   Complete by:  As directed    Discharge instructions   Complete by:  As directed    Maintain surgical dressing until follow up in the clinic. If the edges start to pull up, may reinforce with tape. If the dressing is no longer working, may remove and cover with gauze and tape, but must keep the area dry and clean.  [facility] Call with any questions or concerns.   Increase activity slowly as tolerated   Complete by:  As directed    Weight bearing as tolerated with assist device (walker, cane, etc) as directed, use it as long as suggested by your surgeon or therapist, typically at least 4-6 weeks.   TED hose   Complete by:  As directed    Use stockings (TED hose) for 2 weeks on both leg(s).  You may remove them at night for sleeping.      Allergies as of 09/26/2017   No Known Allergies     Medication List    TAKE these medications   acetaminophen 500 MG tablet Commonly known as:  TYLENOL Take 2 tablets (1,000 mg total) by mouth every 8 (eight) hours.   amoxicillin-clavulanate 875-125 MG tablet Commonly known as:  AUGMENTIN Take 1 tablet by mouth 2 (two) times daily.   aspirin 81 MG chewable tablet Commonly known as:  ASPIRIN CHILDRENS Chew 1 tablet (81 mg total) by mouth 2 (two) times daily. Take for 4 weeks, then resume regular dose.   diclofenac sodium 1 % Gel Commonly known as:  VOLTAREN Apply 1 application topically 4 (four) times daily as needed (for pain).   docusate sodium 100 MG capsule Commonly known as:  COLACE Take 1 capsule (100 mg total) by mouth 2 (two) times daily.   enalapril 20 MG tablet Commonly known as:  VASOTEC Take 20 mg by mouth daily.   escitalopram 20 MG tablet Commonly known as:  LEXAPRO Take 20 mg by mouth daily.   ferrous sulfate 325 (65  FE) MG tablet Commonly known as:  FERROUSUL Take 1 tablet (325 mg total) by mouth 3 (three) times daily with meals.   hydrOXYzine 25 MG tablet Commonly known as:  ATARAX/VISTARIL Take 25 mg by mouth 3 (three) times daily as needed for anxiety.   methocarbamol 500 MG tablet Commonly known as:  ROBAXIN Take 1 tablet (500 mg total) by mouth every 6 (six) hours as needed for muscle spasms.   metoprolol tartrate 100 MG tablet Commonly known as:  LOPRESSOR Take 100 mg by mouth 2 (  two) times daily.   multivitamin tablet Take 1 tablet by mouth daily.   omeprazole 20 MG capsule Commonly known as:  PRILOSEC Take 20 mg by mouth daily.   oxyCODONE 5 MG immediate release tablet Commonly known as:  Oxy IR/ROXICODONE Take 1-3 tablets (5-15 mg total) by mouth every 4 (four) hours as needed for moderate pain or severe pain. What changed:  how much to take   polyethylene glycol packet Commonly known as:  MIRALAX / GLYCOLAX Take 17 g by mouth 2 (two) times daily.   pregabalin 300 MG capsule Commonly known as:  LYRICA Take 300 mg by mouth 2 (two) times daily.   traZODone 100 MG tablet Commonly known as:  DESYREL Take 200 mg by mouth at bedtime as needed for sleep.   verapamil 120 MG CR tablet Commonly known as:  CALAN-SR Take 120 mg by mouth daily.            Discharge Care Instructions  (From admission, onward)        Start     Ordered   09/26/17 0000  Change dressing    Comments:  Maintain surgical dressing until follow up in the clinic. If the edges start to pull up, may reinforce with tape. If the dressing is no longer working, may remove and cover with gauze and tape, but must keep the area dry and clean.  Call with any questions or concerns.   09/26/17 1610       Signed: Anastasio Auerbach. Pheobe Sandiford   PA-C  10/01/2017, 12:01 PM

## 2018-09-09 NOTE — H&P (Signed)
RESECTION TOTAL KNEE ARTHROPLASTY ADMISSION H&P  Patient is being admitted for left resection of total knee arthroplasty with placement of antibiotic spacer.  Subjective:  Chief Complaint:left knee pain.  HPI: Gustin Zobrist, 48 y.o. male, has a history of pain and functional disability in the left knee(s) due to septic left total knee arthroplasty The indications for the revision of the total knee arthroplasty are history of total knee infection. This patient is status post left knee UKA performed on 02/10/2017 by Dr. 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 He began having symptoms of left knee pain, swelling, and stiffness over. Onset of symptoms was gradual starting 1 years ago with gradually worsening course since that time. Patient has evidence of septic left total knee arthroplasty by exam, laboratory studies, and bone scan. This condition presents safety issues increasing the risk of falls.   Patient Active Problem List   Diagnosis Date Noted  . 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 Left knee pain 07/16/2017  . S/P revision of total knee, left 07/16/2017  . Tibial plateau fracture, left 06/12/2017   Past Medical History:  Diagnosis Date  . Anxiety    occasionally  . Arthritis   . Chest pain   . GERD (gastroesophageal reflux disease)   . HTN (hypertension)   . Pre-diabetes   . Sleep apnea    cpap   . SVT (supraventricular tachycardia) (HCC)     Past Surgical History:  Procedure Laterality Date  . ATRIAL ABLATION SURGERY    . CONVERSION TO TOTAL KNEE Left 09/25/2017   Procedure: Left knee conversion from uni compartmental arthroplasty to total knee arthroplasty;  Surgeon: Paralee Cancel, MD;  Location: [facility];  Service: Orthopedics;  Laterality: Left;  90 mins  . I&D KNEE WITH POLY EXCHANGE Left  07/16/2017   Procedure: LEFT KNEE [place] OUT WITH REIMPLANTATION OF EXISTING IMPLANT AFTER SALINE CLEANING.;  Surgeon: Paralee Cancel, MD;  Location: [facility];  Service: Orthopedics;  Laterality: Left;  with block  . JOINT REPLACEMENT     total knee arthroplasty 09-25-17 Dr. Alvan Dame  . ORIF TIBIA PLATEAU Left 06/12/2017   Procedure: Left open reduction internal fixation medial tibial plateau fracture with poly revision medial compartmental arthroplasty;  Surgeon: Paralee Cancel, MD;  Location: [facility];  Service: Orthopedics;  Laterality: Left;  90 mins    No current facility-administered medications for this encounter.    Current Outpatient Medications  Medication Sig Dispense Refill Last Dose  . acetaminophen (TYLENOL) 500 MG tablet Take 2 tablets (1,000 mg total) by mouth every 8 (eight) hours. (Patient not taking: Reported on 09/18/2017) 30 tablet 0   . amoxicillin-clavulanate (AUGMENTIN) 875-125 MG tablet Take 1 tablet by mouth 2 (two) times daily.  0   . diclofenac sodium (VOLTAREN) 1 % GEL Apply 1 application topically 4 (four) times daily as needed (for pain).     Marland Kitchen docusate sodium (COLACE) 100 MG capsule Take 1 capsule (100 mg total) by mouth 2 (two) times daily. 10 capsule 0   . enalapril (VASOTEC) 20 MG tablet Take 20 mg by mouth daily.      Marland Kitchen escitalopram (LEXAPRO) 20 MG tablet Take 20 mg by mouth daily.     . ferrous sulfate (FERROUSUL) 325 (65 FE) MG tablet Take 1 tablet (325 mg total) by mouth 3 (three) times daily with meals.  3   .  hydrOXYzine (ATARAX/VISTARIL) 25 MG tablet Take 25 mg by mouth 3 (three) times daily as needed for anxiety.      . methocarbamol (ROBAXIN) 500 MG tablet Take 1 tablet (500 mg total) by mouth every 6 (six) hours as needed for muscle spasms. 40 tablet 0   . metoprolol (LOPRESSOR) 100 MG tablet Take 100 mg by mouth 2 (two) times daily.       . Multiple Vitamin (MULTIVITAMIN) tablet Take 1 tablet by mouth daily.       Marland Kitchen. omeprazole (PRILOSEC) 20 MG capsule Take 20 mg by  mouth daily.     Marland Kitchen. oxyCODONE (OXY IR/ROXICODONE) 5 MG immediate release tablet Take 1-3 tablets (5-15 mg total) by mouth every 4 (four) hours as needed for moderate pain or severe pain. 90 tablet 0   . polyethylene glycol (MIRALAX / GLYCOLAX) packet Take 17 g by mouth 2 (two) times daily. 14 each 0   . pregabalin (LYRICA) 300 MG capsule Take 300 mg by mouth 2 (two) times daily.     . traZODone (DESYREL) 100 MG tablet Take 200 mg by mouth at bedtime as needed for sleep.      . verapamil (CALAN-SR) 120 MG CR tablet Take 120 mg by mouth daily.       No Known Allergies  Social History   Tobacco Use  . Smoking status: Never Smoker  . Smokeless tobacco: Current User    Types: Chew, Snuff  . Tobacco comment: 30 years ago   Substance Use Topics  . Alcohol use: Not Currently    Alcohol/week: 12.0 standard drinks    Types: 12 Standard drinks or equivalent per week    Frequency: Never    Comment: no    No family history on file.    ROS  Constitutional: no fever, chills, night sweats, or significant weight loss.  Cardiovascular: no palpitations or chest pain.  Respiratory: no cough or shortness of breath and No COPD.  Gastrointestinal: no vomiting or nausea. Musculoskeletal: no swelling in Joints and Joint Pain.  Neurologic: no numbness, tingling, or difficulty with balance. Objective:  Physical Exam Patient is a 48 year old male. Well nourished and well developed. General: Alert and oriented x3, cooperative and pleasant, no acute distress. Head: normocephalic, atraumatic, neck supple. Eyes: EOMI. Respiratory: breath sounds clear in all fields, no wheezing, rales, or rhonchi. Cardiovascular: Regular rate and rhythm, no murmurs, gallops or rubs. Abdomen: non-tender to palpation and soft, normoactive bowel sounds.  Musculoskeletal: LEFT KNEE EXAM Inspection: Incision site intact slight erythema. Slight warmth Palpation: Mild effusion Neurovascular: Sensation intact to light touch  distally and brisk capillary refill  Calves soft and nontender. Motor function intact in LE. Strength 5/5 LE bilaterally. Neuro: Distal pulses 2+. Sensation to light touch intact in LE.  Vital signs in last 24 hours:  Labs:  Estimated body mass index is 42.47 kg/m as calculated from the following:   Height as of 09/26/17: 5\' 10"  (1.778 m).   Weight as of 09/26/17: 134.3 kg.  Imaging Review Plain radiographs demonstrate evidence of previous left total knee arthroplasty, without obvious periprosthetic abnormalities.  08/13/2018: [facility] was reviewed by Dr. Charlann Boxerlin. Demonstrate evidence increased uptake in all 3 phases consistent with infection  Lab work summary: He had an elevated sedimentation rate of 29 with high normal of 25 and an elevated C-reactive protein of 4.1 with high normal less than 1    Assessment/Plan:  Septic left total knee arthroplasty  The patient history, physical examination, clinical judgment of the provider and imaging studies are consistent with septic total knee arthroplasty of the left knee(s), previous total knee arthroplasty. Resection of left total knee arthroplasty with placement of antibiotic spacer is deemed medically necessary. The treatment options including medical management, injection therapy, arthroscopy and revision arthroplasty were discussed at length. The risks and benefits of resection total knee arthroplasty were presented and reviewed. The risks due to aseptic loosening, infection, stiffness, patella tracking problems, thromboembolic complications and other imponderables were discussed. The patient acknowledged the explanation, agreed to proceed with the plan and consent was signed. Patient is being admitted for inpatient treatment for surgery, pain control, PT, OT, prophylactic antibiotics, VTE prophylaxis, progressive ambulation and ADL's and discharge planning.The patient is planning to be  discharged home.   Therapy Plans: Will determine need for PT at 2 week post-op Disposition: Home with wife Planned DVT Prophylaxis: aspirin 81mg  BID DME needed: none [facility] [place] TXA: IV Allergies: NKDA Anesthesia Concerns: none BMI: 38.7  Other[facility]. Patient cannot use Tylenol due to cirrhosis of the liver.  - Patient was instructed on what medications to stop prior to surgery. - Follow-up visit in 2 weeks with Dr. Charlann Boxerlin - Begin physical therapy following surgery - Pre-operative lab work as pre-surgical testing - Prescriptions will be provided in hospital at time of discharge  Dennie BibleAshley Margerite Impastato, PA-C Orthopedic Surgery [facility] [place]

## 2018-09-11 ENCOUNTER — Other Ambulatory Visit (HOSPITAL_COMMUNITY): Payer: Self-pay | Admitting: *Deleted

## 2018-09-11 NOTE — Patient Instructions (Addendum)
YOU NEED TO HAVE A COVID 19 TEST ON 09-13-2018 @9 :00 am, THIS TEST MUST BE DONE BEFORE SURGERY, COME TO [place] ENTRANCE. ONCE YOUR COVID TEST IS COMPLETED, PLEASE BEGIN THE QUARANTINE INSTRUCTIONS AS OUTLINED IN YOUR HANDOUT.                Thomas [name]    Your procedure is scheduled on: 09-17-2018   Report to [facility] Main  Entrance    Report to Admitting at 11:45 AM    Call this number if you have problems the morning of surgery [phone]    Remember:  NO SOLID FOOD AFTER MIDNIGHT THE NIGHT PRIOR TO SURGERY. NOTHING BY MOUTH EXCEPT CLEAR LIQUIDS UNTIL 1115 am. PLEASE FINISH ENSURE DRINK PER SURGEON ORDER 3 HOURS PRIOR TO SCHEDULED SURGERY TIME WHICH NEEDS TO BE COMPLETED AT 11:15 AM.   CLEAR LIQUID DIET   Foods Allowed                                                                     Foods Excluded  Coffee and tea, regular and decaf                             liquids that you cannot  Plain Jell-O in any flavor                                             see through such as: Fruit ices (not with fruit pulp)                                     milk, soups, orange juice  Iced Popsicles                                    All solid food Carbonated beverages, regular and diet                                    Cranberry, grape and apple juices Sports drinks like Gatorade Lightly seasoned clear broth or consume(fat free) Sugar, honey syrup  Sample Menu Breakfast                                Lunch                                     Supper Cranberry juice                    Beef broth                            Chicken broth Jell-O  Grape juice                           Apple juice Coffee or tea                        Jell-O                                      Popsicle                                                Coffee or tea                        Coffee or  tea  _____________________________________________________________________     Take these medicines the morning of surgery with A SIP OF WATER: Escitalopram (Lexapro), Metoprolol (Lopressor) and Omeprazole (Prilosec)  BRUSH YOUR TEETH MORNING OF SURGERY AND RINSE YOUR MOUTH OUT, NO CHEWING GUM CANDY OR MINTS.                               You may not have any metal on your body including hair pins and              piercings     Do not wear jewelry, cologne, lotions, powders or deodorant               Men may shave face and neck.   Do not bring valuables to the hospital. [facility] IS NOT             RESPONSIBLE   FOR VALUABLES.  Contacts, dentures or bridgework may not be worn into surgery.      _____________________________________________________________________             [facility] - Preparing for Surgery Before surgery, you can play an important role.  Because skin is not sterile, your skin needs to be as free of germs as possible.  You can reduce the number of germs on your skin by washing with CHG (chlorahexidine gluconate) soap before surgery.  CHG is an antiseptic cleaner which kills germs and bonds with the skin to continue killing germs even after washing. Please DO NOT use if you have an allergy to CHG or antibacterial soaps.  If your skin becomes reddened/irritated stop using the CHG and inform your nurse when you arrive at Short Stay. Do not shave (including legs and underarms) for at least 48 hours prior to the first CHG shower.  You may shave your face/neck. Please follow these instructions carefully:  1.  Shower with CHG Soap the night before surgery and the  morning of Surgery.  2.  If you choose to wash your hair, wash your hair first as usual with your  normal  shampoo.  3.  After you shampoo, rinse your hair and body thoroughly to remove the  shampoo.                           4.  Use CHG as you would any other liquid soap.  You can apply chg directly  to  the  skin and wash                       Gently with a scrungie or clean washcloth.  5.  Apply the CHG Soap to your body ONLY FROM THE NECK DOWN.   Do not use on face/ open                           Wound or open sores. Avoid contact with eyes, ears mouth and genitals (private parts).                       Wash face,  Genitals (private parts) with your normal soap.             6.  Wash thoroughly, paying special attention to the area where your surgery  will be performed.  7.  Thoroughly rinse your body with warm water from the neck down.  8.  DO NOT shower/wash with your normal soap after using and rinsing off  the CHG Soap.                9.  Pat yourself dry with a clean towel.            10.  Wear clean pajamas.            11.  Place clean sheets on your bed the night of your first shower and do not  sleep with pets. Day of Surgery : Do not apply any lotions/deodorants the morning of surgery.  Please wear clean clothes to the hospital/surgery center.  FAILURE TO FOLLOW THESE INSTRUCTIONS MAY RESULT IN THE CANCELLATION OF YOUR SURGERY PATIENT SIGNATURE_________________________________  NURSE SIGNATURE__________________________________  ________________________________________________________________________   Rogelia MireIncentive Spirometer  An incentive spirometer is a tool that can help keep your lungs clear and active. This tool measures how well you are filling your lungs with each breath. Taking long deep breaths may help reverse or decrease the chance of developing breathing (pulmonary) problems (especially infection) following:  A long period of time when you are unable to move or be active. BEFORE THE PROCEDURE   If the spirometer includes an indicator to show your best effort, your nurse or respiratory therapist will set it to a desired goal.  If possible, sit up straight or lean slightly forward. Try not to slouch.  Hold the incentive spirometer in an upright position. INSTRUCTIONS FOR  USE  1. Sit on the edge of your bed if possible, or sit up as far as you can in bed or on a chair. 2. Hold the incentive spirometer in an upright position. 3. Breathe out normally. 4. Place the mouthpiece in your mouth and seal your lips tightly around it. 5. Breathe in slowly and as deeply as possible, raising the piston or the ball toward the top of the column. 6. Hold your breath for 3-5 seconds or for as long as possible. Allow the piston or ball to fall to the bottom of the column. 7. Remove the mouthpiece from your mouth and breathe out normally. 8. Rest for a few seconds and repeat Steps 1 through 7 at least 10 times every 1-2 hours when you are awake. Take your time and take a few normal breaths between deep breaths. 9. The spirometer may include an indicator to show your best effort. Use the indicator as a goal to work toward during each repetition. 10. After each  set of 10 deep breaths, practice coughing to be sure your lungs are clear. If you have an incision (the cut made at the time of surgery), support your incision when coughing by placing a pillow or rolled up towels firmly against it. Once you are able to get out of bed, walk around indoors and cough well. You may stop using the incentive spirometer when instructed by your caregiver.  RISKS AND COMPLICATIONS  Take your time so you do not get dizzy or light-headed.  If you are in pain, you may need to take or ask for pain medication before doing incentive spirometry. It is harder to take a deep breath if you are having pain. AFTER USE  Rest and breathe slowly and easily.  It can be helpful to keep track of a log of your progress. Your caregiver can provide you with a simple table to help with this. If you are using the spirometer at home, follow these instructions: SEEK MEDICAL CARE IF:   You are having difficultly using the spirometer.  You have trouble using the spirometer as often as instructed.  Your pain medication  is not giving enough relief while using the spirometer.  You develop fever of 100.5 F (38.1 C) or higher. SEEK IMMEDIATE MEDICAL CARE IF:   You cough up bloody sputum that had not been present before.  You develop fever of 102 F (38.9 C) or greater.  You develop worsening pain at or near the incision site. MAKE SURE YOU:   Understand these instructions.  Will watch your condition.  Will get help right away if you are not doing well or get worse. Document Released: 07/03/2006 Document Revised: 05/15/2011 Document Reviewed: 09/03/2006 ExitCare Patient Information 2014 [employer], [place].   ________________________________________________________________________  WHAT IS A BLOOD TRANSFUSION? Blood Transfusion Information  A transfusion is the replacement of blood or some of its parts. Blood is made up of multiple cells which provide different functions.  Red blood cells carry oxygen and are used for blood loss replacement.  White blood cells fight against infection.  Platelets control bleeding.  Plasma helps clot blood.  Other blood products are available for specialized needs, such as hemophilia or other clotting disorders. BEFORE THE TRANSFUSION  Who gives blood for transfusions?   Healthy volunteers who are fully evaluated to make sure their blood is safe. This is blood bank blood. Transfusion therapy is the safest it has ever been in the practice of medicine. Before blood is taken from a donor, a complete history is taken to make sure that person has no history of diseases nor engages in risky social behavior (examples are intravenous drug use or sexual activity with multiple partners). The donor's travel history is screened to minimize risk of transmitting infections, such as malaria. The donated blood is tested for signs of infectious diseases, such as HIV and hepatitis. The blood is then tested to be sure it is compatible with you in order to minimize the chance of a  transfusion reaction. If you or a relative donates blood, this is often done in anticipation of surgery and is not appropriate for emergency situations. It takes many days to process the donated blood. RISKS AND COMPLICATIONS Although transfusion therapy is very safe and saves many lives, the main dangers of transfusion include:   Getting an infectious disease.  Developing a transfusion reaction. This is an allergic reaction to something in the blood you were given. Every precaution is taken to prevent this. The decision to have  a blood transfusion has been considered carefully by your caregiver before blood is given. Blood is not given unless the benefits outweigh the risks. AFTER THE TRANSFUSION  Right after receiving a blood transfusion, you will usually feel much better and more energetic. This is especially true if your red blood cells have gotten low (anemic). The transfusion raises the level of the red blood cells which carry oxygen, and this usually causes an energy increase.  The nurse administering the transfusion will monitor you carefully for complications. HOME CARE INSTRUCTIONS  No special instructions are needed after a transfusion. You may find your energy is better. Speak with your caregiver about any limitations on activity for underlying diseases you may have. SEEK MEDICAL CARE IF:   Your condition is not improving after your transfusion.  You develop redness or irritation at the intravenous (IV) site. SEEK IMMEDIATE MEDICAL CARE IF:  Any of the following symptoms occur over the next 12 hours:  Shaking chills.  You have a temperature by mouth above 102 F (38.9 C), not controlled by medicine.  Chest, back, or muscle pain.  People around you feel you are not acting correctly or are confused.  Shortness of breath or difficulty breathing.  Dizziness and fainting.  You get a rash or develop hives.  You have a decrease in urine output.  Your urine turns a dark  color or changes to pink, red, or brown. Any of the following symptoms occur over the next 10 days:  You have a temperature by mouth above 102 F (38.9 C), not controlled by medicine.  Shortness of breath.  Weakness after normal activity.  The white part of the eye turns yellow (jaundice).  You have a decrease in the amount of urine or are urinating less often.  Your urine turns a dark color or changes to pink, red, or brown. Document Released: 02/18/2000 Document Revised: 05/15/2011 Document Reviewed: 10/07/2007 [facility] Patient Information 2014 [place], [place].  _______________________________________________________________________

## 2018-09-13 ENCOUNTER — Other Ambulatory Visit (HOSPITAL_COMMUNITY)
Admission: RE | Admit: 2018-09-13 | Discharge: 2018-09-13 | Disposition: A | Discharge status: Home / Self Care | Payer: No Typology Code available for payment source | Source: Ambulatory Visit | Attending: Orthopedic Surgery | Admitting: Orthopedic Surgery

## 2018-09-13 ENCOUNTER — Other Ambulatory Visit: Payer: Self-pay

## 2018-09-13 ENCOUNTER — Encounter (HOSPITAL_COMMUNITY): Payer: Self-pay

## 2018-09-13 ENCOUNTER — Encounter (HOSPITAL_COMMUNITY)
Admission: RE | Admit: 2018-09-13 | Discharge: 2018-09-13 | Disposition: A | Discharge status: Home / Self Care | Payer: No Typology Code available for payment source | Source: Ambulatory Visit | Attending: Orthopedic Surgery | Admitting: Orthopedic Surgery

## 2018-09-13 DIAGNOSIS — I440 Atrioventricular block, first degree: Secondary | ICD-10-CM | POA: Insufficient documentation

## 2018-09-13 DIAGNOSIS — R001 Bradycardia, unspecified: Secondary | ICD-10-CM | POA: Insufficient documentation

## 2018-09-13 DIAGNOSIS — Z01818 Encounter for other preprocedural examination: Secondary | ICD-10-CM | POA: Insufficient documentation

## 2018-09-13 DIAGNOSIS — I10 Essential (primary) hypertension: Secondary | ICD-10-CM | POA: Insufficient documentation

## 2018-09-13 DIAGNOSIS — Z1159 Encounter for screening for other viral diseases: Secondary | ICD-10-CM | POA: Insufficient documentation

## 2018-09-13 DIAGNOSIS — Z96652 Presence of left artificial knee joint: Secondary | ICD-10-CM | POA: Diagnosis not present

## 2018-09-13 LAB — CBC
HCT: 43 % (ref 39.0–52.0)
Hemoglobin: 13.3 g/dL (ref 13.0–17.0)
MCH: 24.9 pg — ABNORMAL LOW (ref 26.0–34.0)
MCHC: 30.9 g/dL (ref 30.0–36.0)
MCV: 80.5 fL (ref 80.0–100.0)
Platelets: 150 10*3/uL (ref 150–400)
RBC: 5.34 MIL/uL (ref 4.22–5.81)
RDW: 15.9 % — ABNORMAL HIGH (ref 11.5–15.5)
WBC: 8.2 10*3/uL (ref 4.0–10.5)
nRBC: 0 % (ref 0.0–0.2)

## 2018-09-13 LAB — BASIC METABOLIC PANEL
Anion gap: 7 (ref 5–15)
BUN: 11 mg/dL (ref 6–20)
CO2: 28 mmol/L (ref 22–32)
Calcium: 8.8 mg/dL — ABNORMAL LOW (ref 8.9–10.3)
Chloride: 101 mmol/L (ref 98–111)
Creatinine, Ser: 0.8 mg/dL (ref 0.61–1.24)
GFR calc Af Amer: >60 mL/min (ref >60)
GFR calc non Af Amer: >60 mL/min (ref >60)
Glucose, Bld: 122 mg/dL — ABNORMAL HIGH (ref 70–99)
Potassium: 4.5 mmol/L (ref 3.5–5.1)
Sodium: 136 mmol/L (ref 135–145)

## 2018-09-13 LAB — SURGICAL PCR SCREEN
MRSA, PCR: NEGATIVE
Staphylococcus aureus: NEGATIVE

## 2018-09-13 LAB — SARS CORONAVIRUS 2 (TAT 6-24 HRS): SARS Coronavirus 2: NEGATIVE

## 2018-09-14 LAB — HEMOGLOBIN A1C
Hgb A1c MFr Bld: 6 % — ABNORMAL HIGH (ref 4.8–5.6)
Mean Plasma Glucose: 126 mg/dL

## 2018-09-16 MED ORDER — DEXTROSE 5 % IV SOLN
3.0000 g | INTRAVENOUS | Status: AC
  Administered 2018-09-17: 12:00:00 3 g via INTRAVENOUS
  Filled 2018-09-16: qty 3

## 2018-09-16 NOTE — Progress Notes (Signed)
Pt advised that his surgery time on 09-17-18 is now 12:35 PM. Pt to arrive to Admitting at 10:05 AM, and to have G2 drink consumed by 9:35 AM. All other instructions as provided at PAT appt remains the same. Pt verbalized understanding.

## 2018-09-17 ENCOUNTER — Inpatient Hospital Stay (HOSPITAL_COMMUNITY): Payer: No Typology Code available for payment source | Admitting: Physician Assistant

## 2018-09-17 ENCOUNTER — Other Ambulatory Visit: Payer: Self-pay

## 2018-09-17 ENCOUNTER — Inpatient Hospital Stay: Payer: Self-pay

## 2018-09-17 ENCOUNTER — Encounter (HOSPITAL_COMMUNITY)
Admission: RE | Disposition: A | Discharge status: Home Health | Payer: Self-pay | Source: Home / Self Care | Attending: Orthopedic Surgery

## 2018-09-17 ENCOUNTER — Inpatient Hospital Stay (HOSPITAL_COMMUNITY)
Admission: RE | Admit: 2018-09-17 | Discharge: 2018-09-22 | DRG: 467 | Disposition: A | Discharge status: Home Health | Payer: No Typology Code available for payment source | Attending: Orthopedic Surgery | Admitting: Orthopedic Surgery

## 2018-09-17 ENCOUNTER — Inpatient Hospital Stay (HOSPITAL_COMMUNITY): Payer: No Typology Code available for payment source | Admitting: Certified Registered Nurse Anesthetist

## 2018-09-17 ENCOUNTER — Encounter (HOSPITAL_COMMUNITY): Payer: Self-pay | Admitting: *Deleted

## 2018-09-17 DIAGNOSIS — K5900 Constipation, unspecified: Secondary | ICD-10-CM | POA: Diagnosis not present

## 2018-09-17 DIAGNOSIS — Y831 Surgical operation with implant of artificial internal device as the cause of abnormal reaction of the patient, or of later complication, without mention of misadventure at the time of the procedure: Secondary | ICD-10-CM | POA: Diagnosis present

## 2018-09-17 DIAGNOSIS — T8454XA Infection and inflammatory reaction due to internal left knee prosthesis, initial encounter: Principal | ICD-10-CM | POA: Diagnosis present

## 2018-09-17 DIAGNOSIS — D72829 Elevated white blood cell count, unspecified: Secondary | ICD-10-CM | POA: Diagnosis not present

## 2018-09-17 DIAGNOSIS — K219 Gastro-esophageal reflux disease without esophagitis: Secondary | ICD-10-CM | POA: Diagnosis present

## 2018-09-17 DIAGNOSIS — Z1159 Encounter for screening for other viral diseases: Secondary | ICD-10-CM

## 2018-09-17 DIAGNOSIS — R7303 Prediabetes: Secondary | ICD-10-CM | POA: Diagnosis present

## 2018-09-17 DIAGNOSIS — Z89529 Acquired absence of unspecified knee: Secondary | ICD-10-CM | POA: Diagnosis present

## 2018-09-17 DIAGNOSIS — Z87311 Personal history of (healed) other pathological fracture: Secondary | ICD-10-CM | POA: Diagnosis not present

## 2018-09-17 DIAGNOSIS — F419 Anxiety disorder, unspecified: Secondary | ICD-10-CM | POA: Diagnosis present

## 2018-09-17 DIAGNOSIS — Z79899 Other long term (current) drug therapy: Secondary | ICD-10-CM

## 2018-09-17 DIAGNOSIS — Z79891 Long term (current) use of opiate analgesic: Secondary | ICD-10-CM

## 2018-09-17 DIAGNOSIS — Z95828 Presence of other vascular implants and grafts: Secondary | ICD-10-CM | POA: Diagnosis not present

## 2018-09-17 DIAGNOSIS — F17220 Nicotine dependence, chewing tobacco, uncomplicated: Secondary | ICD-10-CM | POA: Diagnosis not present

## 2018-09-17 DIAGNOSIS — I471 Supraventricular tachycardia: Secondary | ICD-10-CM | POA: Diagnosis present

## 2018-09-17 DIAGNOSIS — Z792 Long term (current) use of antibiotics: Secondary | ICD-10-CM | POA: Diagnosis not present

## 2018-09-17 DIAGNOSIS — Z872 Personal history of diseases of the skin and subcutaneous tissue: Secondary | ICD-10-CM | POA: Diagnosis not present

## 2018-09-17 DIAGNOSIS — Z6841 Body Mass Index (BMI) 40.0 and over, adult: Secondary | ICD-10-CM

## 2018-09-17 DIAGNOSIS — G4730 Sleep apnea, unspecified: Secondary | ICD-10-CM | POA: Diagnosis present

## 2018-09-17 DIAGNOSIS — I10 Essential (primary) hypertension: Secondary | ICD-10-CM | POA: Diagnosis present

## 2018-09-17 DIAGNOSIS — F17290 Nicotine dependence, other tobacco product, uncomplicated: Secondary | ICD-10-CM | POA: Diagnosis present

## 2018-09-17 HISTORY — PX: EXCISIONAL TOTAL KNEE ARTHROPLASTY WITH ANTIBIOTIC SPACERS: SHX5827

## 2018-09-17 LAB — TYPE AND SCREEN
ABO/RH(D): A POS
Antibody Screen: NEGATIVE

## 2018-09-17 SURGERY — REMOVAL, TOTAL ARTHROPLASTY HARDWARE, KNEE, WITH ANTIBIOTIC SPACER INSERTION
Anesthesia: Spinal | Site: Knee | Laterality: Left

## 2018-09-17 MED ORDER — TOBRAMYCIN SULFATE 1.2 G IJ SOLR
INTRAMUSCULAR | Status: DC | PRN
  Administered 2018-09-17: 3.6 g

## 2018-09-17 MED ORDER — FERROUS SULFATE 325 (65 FE) MG PO TABS
325.0000 mg | ORAL_TABLET | Freq: Two times a day (BID) | ORAL | Status: DC
  Administered 2018-09-17 – 2018-09-22 (×10): 325 mg via ORAL
  Filled 2018-09-17 (×10): qty 1

## 2018-09-17 MED ORDER — 0.9 % SODIUM CHLORIDE (POUR BTL) OPTIME
TOPICAL | Status: DC | PRN
  Administered 2018-09-17: 14:00:00 1000 mL

## 2018-09-17 MED ORDER — LACTATED RINGERS IV SOLN
INTRAVENOUS | Status: DC
  Administered 2018-09-17 (×2): via INTRAVENOUS

## 2018-09-17 MED ORDER — MIDAZOLAM HCL 2 MG/2ML IJ SOLN
1.0000 mg | INTRAMUSCULAR | Status: DC
  Administered 2018-09-17: 1 mg via INTRAVENOUS
  Filled 2018-09-17: qty 2

## 2018-09-17 MED ORDER — MIDAZOLAM HCL 2 MG/2ML IJ SOLN
INTRAMUSCULAR | Status: AC
  Filled 2018-09-17: qty 2

## 2018-09-17 MED ORDER — BISACODYL 10 MG RE SUPP: 10.0000 mg | Freq: Every day | RECTAL | Status: DC | PRN

## 2018-09-17 MED ORDER — VANCOMYCIN HCL 10 G IV SOLR
1500.0000 mg | Freq: Two times a day (BID) | INTRAVENOUS | Status: DC
  Administered 2018-09-18 – 2018-09-22 (×9): 1500 mg via INTRAVENOUS
  Filled 2018-09-17 (×9): qty 1500

## 2018-09-17 MED ORDER — SODIUM CHLORIDE (PF) 0.9 % IJ SOLN
INTRAMUSCULAR | Status: AC
  Filled 2018-09-17: qty 50

## 2018-09-17 MED ORDER — PROPOFOL 10 MG/ML IV BOLUS
INTRAVENOUS | Status: AC
  Filled 2018-09-17: qty 20

## 2018-09-17 MED ORDER — OXYCODONE HCL 5 MG PO TABS
10.0000 mg | ORAL_TABLET | ORAL | Status: DC | PRN
  Administered 2018-09-17 – 2018-09-18 (×4): 10 mg via ORAL
  Filled 2018-09-17 (×4): qty 2

## 2018-09-17 MED ORDER — BUPIVACAINE-EPINEPHRINE (PF) 0.25% -1:200000 IJ SOLN
INTRAMUSCULAR | Status: AC
  Filled 2018-09-17: qty 30

## 2018-09-17 MED ORDER — OXYCODONE HCL 5 MG/5ML PO SOLN: 5.0000 mg | Freq: Once | ORAL | Status: DC | PRN

## 2018-09-17 MED ORDER — ACETAMINOPHEN 10 MG/ML IV SOLN: 1000.0000 mg | Freq: Once | INTRAVENOUS | Status: DC | PRN

## 2018-09-17 MED ORDER — MENTHOL 3 MG MT LOZG: 1.0000 | LOZENGE | OROMUCOSAL | Status: DC | PRN

## 2018-09-17 MED ORDER — ONDANSETRON HCL 4 MG/2ML IJ SOLN: 4.0000 mg | Freq: Four times a day (QID) | INTRAMUSCULAR | Status: DC | PRN

## 2018-09-17 MED ORDER — MIDAZOLAM HCL 2 MG/2ML IJ SOLN
INTRAMUSCULAR | Status: DC | PRN
  Administered 2018-09-17: 1 mg via INTRAVENOUS

## 2018-09-17 MED ORDER — DOCUSATE SODIUM 100 MG PO CAPS
100.0000 mg | ORAL_CAPSULE | Freq: Two times a day (BID) | ORAL | Status: DC
  Administered 2018-09-17 – 2018-09-22 (×9): 100 mg via ORAL
  Filled 2018-09-17 (×10): qty 1

## 2018-09-17 MED ORDER — TRANEXAMIC ACID-NACL 1000-0.7 MG/100ML-% IV SOLN
1000.0000 mg | INTRAVENOUS | Status: AC
  Administered 2018-09-17: 1000 mg via INTRAVENOUS
  Filled 2018-09-17: qty 100

## 2018-09-17 MED ORDER — DIPHENHYDRAMINE HCL 12.5 MG/5ML PO ELIX: 12.5000 mg | ORAL_SOLUTION | ORAL | Status: DC | PRN

## 2018-09-17 MED ORDER — VERAPAMIL HCL ER 120 MG PO TBCR
120.0000 mg | EXTENDED_RELEASE_TABLET | Freq: Every day | ORAL | Status: DC
  Administered 2018-09-18 – 2018-09-22 (×5): 120 mg via ORAL
  Filled 2018-09-17 (×5): qty 1

## 2018-09-17 MED ORDER — ONDANSETRON HCL 4 MG/2ML IJ SOLN
INTRAMUSCULAR | Status: AC
  Filled 2018-09-17: qty 2

## 2018-09-17 MED ORDER — FENTANYL CITRATE (PF) 100 MCG/2ML IJ SOLN
50.0000 ug | INTRAMUSCULAR | Status: DC
  Administered 2018-09-17: 100 ug via INTRAVENOUS
  Filled 2018-09-17: qty 2

## 2018-09-17 MED ORDER — TRANEXAMIC ACID-NACL 1000-0.7 MG/100ML-% IV SOLN
1000.0000 mg | Freq: Once | INTRAVENOUS | Status: AC
  Administered 2018-09-17: 1000 mg via INTRAVENOUS
  Filled 2018-09-17: qty 100

## 2018-09-17 MED ORDER — HYDROXYZINE HCL 25 MG PO TABS: 50.0000 mg | ORAL_TABLET | Freq: Every evening | ORAL | Status: DC | PRN

## 2018-09-17 MED ORDER — OXYCODONE HCL 5 MG PO TABS: 5.0000 mg | ORAL_TABLET | ORAL | Status: DC | PRN

## 2018-09-17 MED ORDER — ONDANSETRON HCL 4 MG PO TABS: 4.0000 mg | ORAL_TABLET | Freq: Four times a day (QID) | ORAL | Status: DC | PRN

## 2018-09-17 MED ORDER — ESCITALOPRAM OXALATE 20 MG PO TABS
20.0000 mg | ORAL_TABLET | Freq: Every day | ORAL | Status: DC
  Administered 2018-09-18 – 2018-09-22 (×5): 20 mg via ORAL
  Filled 2018-09-17 (×5): qty 1

## 2018-09-17 MED ORDER — ALUM & MAG HYDROXIDE-SIMETH 200-200-20 MG/5ML PO SUSP: 15.0000 mL | ORAL | Status: DC | PRN

## 2018-09-17 MED ORDER — FENTANYL CITRATE (PF) 100 MCG/2ML IJ SOLN
INTRAMUSCULAR | Status: DC | PRN
  Administered 2018-09-17 (×4): 25 ug via INTRAVENOUS

## 2018-09-17 MED ORDER — TRAZODONE HCL 100 MG PO TABS
200.0000 mg | ORAL_TABLET | Freq: Every day | ORAL | Status: DC
Start: 2018-09-17 — End: 2018-09-22
  Administered 2018-09-17 – 2018-09-21 (×5): 200 mg via ORAL
  Filled 2018-09-17 (×5): qty 2

## 2018-09-17 MED ORDER — METHOCARBAMOL 500 MG IVPB - SIMPLE MED
INTRAVENOUS | Status: AC
  Filled 2018-09-17: qty 50

## 2018-09-17 MED ORDER — FENTANYL CITRATE (PF) 100 MCG/2ML IJ SOLN
INTRAMUSCULAR | Status: AC
  Filled 2018-09-17: qty 2

## 2018-09-17 MED ORDER — HYDROMORPHONE HCL 1 MG/ML IJ SOLN
0.5000 mg | INTRAMUSCULAR | Status: DC | PRN
  Filled 2018-09-17: qty 1

## 2018-09-17 MED ORDER — BUPIVACAINE IN DEXTROSE 0.75-8.25 % IT SOLN
INTRATHECAL | Status: DC | PRN
  Administered 2018-09-17: 1.8 mL via INTRATHECAL

## 2018-09-17 MED ORDER — METOCLOPRAMIDE HCL 5 MG/ML IJ SOLN: 5.0000 mg | Freq: Three times a day (TID) | INTRAMUSCULAR | Status: DC | PRN

## 2018-09-17 MED ORDER — PROPOFOL 500 MG/50ML IV EMUL
INTRAVENOUS | Status: DC | PRN
  Administered 2018-09-17: 30 ug/kg/min via INTRAVENOUS

## 2018-09-17 MED ORDER — SODIUM CHLORIDE 0.9 % IV SOLN
INTRAVENOUS | Status: DC
  Administered 2018-09-17 – 2018-09-20 (×3): via INTRAVENOUS

## 2018-09-17 MED ORDER — PROPOFOL 10 MG/ML IV BOLUS
INTRAVENOUS | Status: DC | PRN
  Administered 2018-09-17 (×2): 10 mg via INTRAVENOUS
  Administered 2018-09-17: 20 mg via INTRAVENOUS
  Administered 2018-09-17: 10 mg via INTRAVENOUS
  Administered 2018-09-17 (×3): 20 mg via INTRAVENOUS
  Administered 2018-09-17: 10 mg via INTRAVENOUS
  Administered 2018-09-17: 20 mg via INTRAVENOUS

## 2018-09-17 MED ORDER — PHENOL 1.4 % MT LIQD: 1.0000 | OROMUCOSAL | Status: DC | PRN

## 2018-09-17 MED ORDER — ACETAMINOPHEN 500 MG PO TABS
1000.0000 mg | ORAL_TABLET | Freq: Four times a day (QID) | ORAL | Status: AC
  Administered 2018-09-17 – 2018-09-18 (×3): 1000 mg via ORAL
  Filled 2018-09-17 (×4): qty 2

## 2018-09-17 MED ORDER — FENTANYL CITRATE (PF) 100 MCG/2ML IJ SOLN
25.0000 ug | INTRAMUSCULAR | Status: DC | PRN
  Administered 2018-09-17 (×2): 50 ug via INTRAVENOUS

## 2018-09-17 MED ORDER — METOCLOPRAMIDE HCL 5 MG PO TABS: 5.0000 mg | ORAL_TABLET | Freq: Three times a day (TID) | ORAL | Status: DC | PRN

## 2018-09-17 MED ORDER — MAGNESIUM CITRATE PO SOLN: 1.0000 | Freq: Once | ORAL | Status: DC | PRN

## 2018-09-17 MED ORDER — SODIUM CHLORIDE (PF) 0.9 % IJ SOLN
INTRAMUSCULAR | Status: DC | PRN
  Administered 2018-09-17: 30 mL

## 2018-09-17 MED ORDER — METOPROLOL TARTRATE 50 MG PO TABS
100.0000 mg | ORAL_TABLET | Freq: Two times a day (BID) | ORAL | Status: DC
  Administered 2018-09-17 – 2018-09-22 (×10): 100 mg via ORAL
  Filled 2018-09-17 (×10): qty 2

## 2018-09-17 MED ORDER — DEXAMETHASONE SODIUM PHOSPHATE 10 MG/ML IJ SOLN
INTRAMUSCULAR | Status: AC
  Filled 2018-09-17: qty 1

## 2018-09-17 MED ORDER — PROPOFOL 10 MG/ML IV BOLUS
INTRAVENOUS | Status: AC
  Filled 2018-09-17: qty 60

## 2018-09-17 MED ORDER — HYDROMORPHONE HCL 1 MG/ML IJ SOLN
0.5000 mg | INTRAMUSCULAR | Status: DC | PRN
Start: 2018-09-17 — End: 2018-09-22
  Administered 2018-09-17 – 2018-09-18 (×2): 1 mg via INTRAVENOUS
  Filled 2018-09-17: qty 1

## 2018-09-17 MED ORDER — KETOROLAC TROMETHAMINE 30 MG/ML IJ SOLN
INTRAMUSCULAR | Status: AC
  Filled 2018-09-17: qty 1

## 2018-09-17 MED ORDER — METHOCARBAMOL 500 MG PO TABS
500.0000 mg | ORAL_TABLET | Freq: Four times a day (QID) | ORAL | Status: DC | PRN
  Administered 2018-09-17 – 2018-09-22 (×13): 500 mg via ORAL
  Filled 2018-09-17 (×13): qty 1

## 2018-09-17 MED ORDER — POVIDONE-IODINE 10 % EX SWAB
2.0000 "application " | Freq: Once | CUTANEOUS | Status: AC
  Administered 2018-09-17: 2 via TOPICAL

## 2018-09-17 MED ORDER — BUPIVACAINE-EPINEPHRINE (PF) 0.5% -1:200000 IJ SOLN
INTRAMUSCULAR | Status: DC | PRN
  Administered 2018-09-17: 15 mL via PERINEURAL

## 2018-09-17 MED ORDER — BUPIVACAINE-EPINEPHRINE (PF) 0.25% -1:200000 IJ SOLN
INTRAMUSCULAR | Status: DC | PRN
  Administered 2018-09-17: 30 mL

## 2018-09-17 MED ORDER — VANCOMYCIN HCL 1000 MG IV SOLR
INTRAVENOUS | Status: DC | PRN
  Administered 2018-09-17: 3000 mg

## 2018-09-17 MED ORDER — TOBRAMYCIN SULFATE 1.2 G IJ SOLR
INTRAMUSCULAR | Status: AC
  Filled 2018-09-17: qty 3.6

## 2018-09-17 MED ORDER — SODIUM CHLORIDE 0.9 % IR SOLN
Status: DC | PRN
  Administered 2018-09-17: 10000 mL

## 2018-09-17 MED ORDER — OMEPRAZOLE 20 MG PO CPDR
20.0000 mg | DELAYED_RELEASE_CAPSULE | Freq: Every day | ORAL | Status: DC
  Administered 2018-09-18 – 2018-09-22 (×5): 20 mg via ORAL
  Filled 2018-09-17 (×5): qty 1

## 2018-09-17 MED ORDER — PHENYLEPHRINE 40 MCG/ML (10ML) SYRINGE FOR IV PUSH (FOR BLOOD PRESSURE SUPPORT): PREFILLED_SYRINGE | INTRAVENOUS | Status: DC | PRN

## 2018-09-17 MED ORDER — METHOCARBAMOL 500 MG IVPB - SIMPLE MED
500.0000 mg | Freq: Four times a day (QID) | INTRAVENOUS | Status: DC | PRN
  Administered 2018-09-17: 500 mg via INTRAVENOUS
  Filled 2018-09-17: qty 50

## 2018-09-17 MED ORDER — ONDANSETRON HCL 4 MG/2ML IJ SOLN
INTRAMUSCULAR | Status: DC | PRN
  Administered 2018-09-17: 4 mg via INTRAVENOUS

## 2018-09-17 MED ORDER — DEXAMETHASONE SODIUM PHOSPHATE 10 MG/ML IJ SOLN
10.0000 mg | Freq: Once | INTRAMUSCULAR | Status: AC
  Administered 2018-09-17: 10 mg via INTRAVENOUS

## 2018-09-17 MED ORDER — VANCOMYCIN HCL 1000 MG IV SOLR
INTRAVENOUS | Status: AC
  Filled 2018-09-17: qty 3000

## 2018-09-17 MED ORDER — PHENYLEPHRINE HCL (PRESSORS) 10 MG/ML IV SOLN
INTRAVENOUS | Status: AC
  Filled 2018-09-17: qty 1

## 2018-09-17 MED ORDER — PROMETHAZINE HCL 25 MG/ML IJ SOLN: 6.2500 mg | INTRAMUSCULAR | Status: DC | PRN

## 2018-09-17 MED ORDER — CHLORHEXIDINE GLUCONATE 4 % EX LIQD
60.0000 mL | Freq: Once | CUTANEOUS | Status: AC
  Administered 2018-09-17: 4 via TOPICAL

## 2018-09-17 MED ORDER — KETOROLAC TROMETHAMINE 30 MG/ML IJ SOLN
INTRAMUSCULAR | Status: DC | PRN
  Administered 2018-09-17: 30 mg via INTRAMUSCULAR

## 2018-09-17 MED ORDER — PREGABALIN 100 MG PO CAPS
300.0000 mg | ORAL_CAPSULE | Freq: Two times a day (BID) | ORAL | Status: DC
  Administered 2018-09-17 – 2018-09-22 (×10): 300 mg via ORAL
  Filled 2018-09-17 (×10): qty 3

## 2018-09-17 MED ORDER — PROPOFOL 10 MG/ML IV BOLUS
INTRAVENOUS | Status: AC
  Filled 2018-09-17: qty 40

## 2018-09-17 MED ORDER — POLYETHYLENE GLYCOL 3350 17 G PO PACK
17.0000 g | PACK | Freq: Two times a day (BID) | ORAL | Status: DC
  Administered 2018-09-17 – 2018-09-19 (×5): 17 g via ORAL
  Filled 2018-09-17 (×9): qty 1

## 2018-09-17 MED ORDER — CELECOXIB 200 MG PO CAPS
200.0000 mg | ORAL_CAPSULE | Freq: Two times a day (BID) | ORAL | Status: DC
  Administered 2018-09-17 – 2018-09-22 (×10): 200 mg via ORAL
  Filled 2018-09-17 (×10): qty 1

## 2018-09-17 MED ORDER — OXYCODONE HCL 5 MG PO TABS: 5.0000 mg | ORAL_TABLET | Freq: Once | ORAL | Status: DC | PRN

## 2018-09-17 MED ORDER — VANCOMYCIN HCL 10 G IV SOLR
2000.0000 mg | Freq: Once | INTRAVENOUS | Status: AC
  Administered 2018-09-17: 2000 mg via INTRAVENOUS
  Filled 2018-09-17: qty 2000

## 2018-09-17 MED ORDER — PHENYLEPHRINE HCL (PRESSORS) 10 MG/ML IV SOLN
INTRAVENOUS | Status: DC | PRN
  Administered 2018-09-17 (×2): 40 ug via INTRAVENOUS
  Administered 2018-09-17: 80 ug via INTRAVENOUS

## 2018-09-17 MED ORDER — DEXAMETHASONE SODIUM PHOSPHATE 10 MG/ML IJ SOLN
10.0000 mg | Freq: Once | INTRAMUSCULAR | Status: AC
  Administered 2018-09-18: 10 mg via INTRAVENOUS
  Filled 2018-09-17: qty 1

## 2018-09-17 MED ORDER — ASPIRIN 81 MG PO CHEW
81.0000 mg | CHEWABLE_TABLET | Freq: Two times a day (BID) | ORAL | Status: DC
  Administered 2018-09-17 – 2018-09-22 (×10): 81 mg via ORAL
  Filled 2018-09-17 (×10): qty 1

## 2018-09-17 SURGICAL SUPPLY — 79 items
BAG ZIPLOCK 12X15 (MISCELLANEOUS) ×3 IMPLANT
BANDAGE ACE 6X5 VEL STRL LF (GAUZE/BANDAGES/DRESSINGS) ×3 IMPLANT
BANDAGE ESMARK 6X9 LF (GAUZE/BANDAGES/DRESSINGS) ×1 IMPLANT
BLADE SAW SGTL 11.0X1.19X90.0M (BLADE) IMPLANT
BLADE SAW SGTL 13.0X1.19X90.0M (BLADE) ×3 IMPLANT
BLADE SAW SGTL 81X20 HD (BLADE) ×1 IMPLANT
BNDG ESMARK 6X9 LF (GAUZE/BANDAGES/DRESSINGS) ×3
BOWL SMART MIX CTS (DISPOSABLE) IMPLANT
BRUSH FEMORAL CANAL (MISCELLANEOUS) ×2 IMPLANT
CEMENT HV SMART SET (Cement) ×11 IMPLANT
CONT SPEC 4OZ CLIKSEAL STRL BL (MISCELLANEOUS) ×2 IMPLANT
COVER SURGICAL LIGHT HANDLE (MISCELLANEOUS) ×3 IMPLANT
COVER WAND RF STERILE (DRAPES) IMPLANT
CUFF TOURN SGL QUICK 34 (TOURNIQUET CUFF) ×2
CUFF TOURN SGL QUICK 42 (TOURNIQUET CUFF) ×2 IMPLANT
CUFF TRNQT CYL 34X4.125X (TOURNIQUET CUFF) ×1 IMPLANT
DERMABOND ADVANCED (GAUZE/BANDAGES/DRESSINGS) ×2
DERMABOND ADVANCED .7 DNX12 (GAUZE/BANDAGES/DRESSINGS) ×1 IMPLANT
DRAPE POUCH INSTRU U-SHP 10X18 (DRAPES) ×3 IMPLANT
DRAPE SHEET LG 3/4 BI-LAMINATE (DRAPES) ×3 IMPLANT
DRAPE U-SHAPE 47X51 STRL (DRAPES) ×3 IMPLANT
DRESSING AQUACEL AG SP 3.5X10 (GAUZE/BANDAGES/DRESSINGS) ×1 IMPLANT
DRSG AQUACEL AG ADV 3.5X14 (GAUZE/BANDAGES/DRESSINGS) ×2 IMPLANT
DRSG AQUACEL AG SP 3.5X10 (GAUZE/BANDAGES/DRESSINGS)
DURAPREP 26ML APPLICATOR (WOUND CARE) ×6 IMPLANT
ELECT REM PT RETURN 15FT ADLT (MISCELLANEOUS) ×3 IMPLANT
FACESHIELD WRAPAROUND (MASK) IMPLANT
FACESHIELD WRAPAROUND OR TEAM (MASK) ×5 IMPLANT
FEMUR SIGMA PS SZ 5.0 L (Femur) ×2 IMPLANT
GLOVE BIO SURGEON STRL SZ 6 (GLOVE) ×4 IMPLANT
GLOVE BIOGEL M 7.0 STRL (GLOVE) IMPLANT
GLOVE BIOGEL PI IND STRL 6.5 (GLOVE) IMPLANT
GLOVE BIOGEL PI IND STRL 7.5 (GLOVE) ×1 IMPLANT
GLOVE BIOGEL PI IND STRL 8.5 (GLOVE) ×1 IMPLANT
GLOVE BIOGEL PI INDICATOR 6.5 (GLOVE) ×6
GLOVE BIOGEL PI INDICATOR 7.5 (GLOVE) ×2
GLOVE BIOGEL PI INDICATOR 8.5 (GLOVE)
GLOVE ECLIPSE 8.0 STRL XLNG CF (GLOVE) ×2 IMPLANT
GLOVE ORTHO TXT STRL SZ7.5 (GLOVE) ×8 IMPLANT
GOWN STRL REUS W/TWL LRG LVL3 (GOWN DISPOSABLE) ×5 IMPLANT
GOWN STRL REUS W/TWL XL LVL3 (GOWN DISPOSABLE) ×1 IMPLANT
HANDPIECE INTERPULSE COAX TIP (DISPOSABLE) ×2
HOLDER FOLEY CATH W/STRAP (MISCELLANEOUS) ×2 IMPLANT
IMMOBILIZER KNEE 20 (SOFTGOODS) ×6
IMMOBILIZER KNEE 20 THIGH 36 (SOFTGOODS) IMPLANT
INSERT TIBIAL ROT PLAT 5 KNEE (Insert) ×2 IMPLANT
KIT TURNOVER KIT A (KITS) ×2 IMPLANT
MANIFOLD NEPTUNE II (INSTRUMENTS) ×3 IMPLANT
MARKER SKIN DUAL TIP RULER LAB (MISCELLANEOUS) ×3 IMPLANT
NDL SAFETY ECLIPSE 18X1.5 (NEEDLE) ×1 IMPLANT
NEEDLE HYPO 18GX1.5 SHARP (NEEDLE) ×2
NS IRRIG 1000ML POUR BTL (IV SOLUTION) ×1 IMPLANT
PADDING CAST COTTON 6X4 STRL (CAST SUPPLIES) ×2 IMPLANT
PROTECTOR NERVE ULNAR (MISCELLANEOUS) ×3 IMPLANT
SET HNDPC FAN SPRY TIP SCT (DISPOSABLE) ×1 IMPLANT
SET PAD KNEE POSITIONER (MISCELLANEOUS) ×3 IMPLANT
SPONGE LAP 18X18 RF (DISPOSABLE) ×3 IMPLANT
STAPLER VISISTAT 35W (STAPLE) IMPLANT
SUCTION FRAZIER HANDLE 12FR (TUBING) ×4
SUCTION TUBE FRAZIER 12FR DISP (TUBING) ×1 IMPLANT
SUT MNCRL AB 3-0 PS2 18 (SUTURE) ×2 IMPLANT
SUT MNCRL AB 4-0 PS2 18 (SUTURE) ×3 IMPLANT
SUT STRATAFIX 0 PDS 27 VIOLET (SUTURE) ×3
SUT VIC AB 1 CT1 36 (SUTURE) ×6 IMPLANT
SUT VIC AB 2-0 CT1 27 (SUTURE) ×8
SUT VIC AB 2-0 CT1 27XBRD (SUTURE) IMPLANT
SUT VIC AB 2-0 CT1 TAPERPNT 27 (SUTURE) ×3 IMPLANT
SUT VLOC 180 0 24IN GS25 (SUTURE) IMPLANT
SUTURE STRATFX 0 PDS 27 VIOLET (SUTURE) IMPLANT
SWAB COLLECTION DEVICE MRSA (MISCELLANEOUS) ×2 IMPLANT
SWAB CULTURE ESWAB REG 1ML (MISCELLANEOUS) ×2 IMPLANT
SYR 50ML LL SCALE MARK (SYRINGE) ×3 IMPLANT
TOWEL OR 17X26 10 PK STRL BLUE (TOWEL DISPOSABLE) ×4 IMPLANT
TOWEL OR NON WOVEN STRL DISP B (DISPOSABLE) ×3 IMPLANT
TOWER CARTRIDGE SMART MIX (DISPOSABLE) ×5 IMPLANT
TRAY FOLEY MTR SLVR 16FR STAT (SET/KITS/TRAYS/PACK) ×3 IMPLANT
WATER STERILE IRR 1000ML POUR (IV SOLUTION) ×5 IMPLANT
WRAP KNEE MAXI GEL POST OP (GAUZE/BANDAGES/DRESSINGS) ×3 IMPLANT
YANKAUER SUCT BULB TIP 10FT TU (MISCELLANEOUS) ×3 IMPLANT

## 2018-09-17 NOTE — Anesthesia Procedure Notes (Signed)
Spinal  Patient location during procedure: OR Start time: 09/17/2018 12:24 PM End time: 09/17/2018 12:26 PM Staffing Anesthesiologist: [name], MD Performed: anesthesiologist  Preanesthetic Checklist Completed: patient identified, surgical consent, pre-op evaluation, timeout performed, IV checked, risks and benefits discussed and monitors and equipment checked Spinal Block Patient position: sitting Prep: site prepped and draped and DuraPrep Patient monitoring: cardiac monitor, continuous pulse ox and blood pressure Approach: midline Location: L3-4 Injection technique: single-shot Needle Needle type: Pencan  Needle gauge: 24 G Needle length: 9 cm Additional Notes Risks, benefits, and alternative discussed. Patient gave consent to procedure. Prepped and draped in sitting position. Pt sedated but easily responsive to voice. Clear CSF obtained after one needle pass. Transient paraesthesia on left, resolved prior to injection. Injection not painful. Positive terminal aspiration. Patient tolerated procedure well. Vital signs stable. Tawny Asal, MD

## 2018-09-17 NOTE — Anesthesia Postprocedure Evaluation (Signed)
Anesthesia Post Note  Patient: Thomas [name]  Procedure(s) Performed: Resection of left total knee arthroplasty and placement of antibiotic spacer (Left Knee)     Patient location during evaluation: PACU Anesthesia Type: Spinal Level of consciousness: awake and alert Pain management: pain level controlled Vital Signs Assessment: post-procedure vital signs reviewed and stable Respiratory status: spontaneous breathing, nonlabored ventilation and respiratory function stable Cardiovascular status: blood pressure returned to baseline and stable Postop Assessment: no apparent nausea or vomiting Anesthetic complications: no    Last Vitals:  Vitals:   09/17/18 1615 09/17/18 1641  BP: 114/72 118/74  Pulse: (!) 55 60  Resp: 13 18  Temp: 36.6 C 36.5 C  SpO2: 94% 97%    Last Pain:  Vitals:   09/17/18 1641  TempSrc: Oral  PainSc:                  [name]

## 2018-09-17 NOTE — Progress Notes (Signed)
Assisted Dr. Howze with left, ultrasound guided, adductor canal block. Side rails up, monitors on throughout procedure. See vital signs in flow sheet. Tolerated Procedure well.  

## 2018-09-17 NOTE — Progress Notes (Signed)
Pharmacy Antibiotic Note  Thomas [name] is a 48 y.o. 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  Pharmacy has been consulted for vancomycin dosing.  Pt underwent resection of left TKA and placement of antibiotic spacer.  Today, 09/17/18  Afebrile  WBC 8.2 - WNL on 7/10  SCr 0.8 - WNL on 7/10  Plan:  Vancomycin 2000 mg LD following by 1500 mg IV q12h  Goal AUC 400-550  Follow renal function closely for any necessary dose changes  Follow culture data  Check vancomycin levels once at steady state if indicated  Height: 5\' 10"  (177.8 cm) Weight: 288 lb 12.8 oz (131 kg) IBW/kg (Calculated) : 73  Temp (24hrs), Avg:97.9 F (36.6 C), Min:97.7 F (36.5 C), Max:98.2 F (36.8 C)  Recent Labs  Lab 09/13/18 0856  WBC 8.2  CREATININE 0.80    Estimated Creatinine Clearance: 150.3 mL/min (by C-G formula based on SCr of 0.8 mg/dL).    No Known Allergies   Antimicrobials this admission: vancomycin 7/14 >>   Dose adjustments this admission:  Microbiology results: 7/14 synovial knee fluid: Pending  Thank you for allowing pharmacy to be a part of this patient's care.  Lenis Noon, PharmD 09/17/2018 5:05 PM

## 2018-09-17 NOTE — Transfer of Care (Signed)
Immediate Anesthesia Transfer of Care Note  Patient: Thomas [name]  Procedure(s) Performed: Procedure(s) with comments: Resection of left total knee arthroplasty and placement of antibiotic spacer (Left) - 90 mins  Patient Location: PACU  Anesthesia Type:MAC, Regional and Spinal  Level of Consciousness: Patient easily awoken, sedated, comfortable, cooperative, following commands, responds to stimulation.   Airway & Oxygen Therapy: Patient spontaneously breathing, ventilating well, oxygen via simple oxygen mask.  Post-op Assessment: Report given to PACU RN, vital signs reviewed and stable.   Post vital signs: Reviewed and stable.  Complications: No apparent anesthesia complications  Last Vitals:  Vitals Value Taken Time  BP 113/80 09/17/18 1538  Temp    Pulse 74 09/17/18 1541  Resp 6 09/17/18 1541  SpO2 99 % 09/17/18 1541  Vitals shown include unvalidated device data.  Last Pain:  Vitals:   09/17/18 1157  TempSrc:   PainSc: 0-No pain      Patients Stated Pain Goal: 3 (69/62/95 2841)  Complications: No apparent anesthesia complications

## 2018-09-17 NOTE — Anesthesia Preprocedure Evaluation (Addendum)
Anesthesia Evaluation  Patient identified by MRN, date of birth, ID band Patient awake    Reviewed: Allergy & Precautions, NPO status , Patient's Chart, lab work & pertinent test results, reviewed documented beta blocker date and time   History of Anesthesia Complications Negative for: history of anesthetic complications  Airway Mallampati: II  TM Distance: >3 FB Neck ROM: Full    Dental no notable dental hx.    Pulmonary sleep apnea and Continuous Positive Airway Pressure Ventilation ,    Pulmonary exam normal        Cardiovascular hypertension, Pt. on medications and Pt. on home beta blockers Normal cardiovascular exam+ dysrhythmias Supra Ventricular Tachycardia      Neuro/Psych Anxiety negative neurological ROS     GI/Hepatic Neg liver ROS, GERD  Controlled and Medicated,  Endo/Other  Morbid obesity  Renal/GU negative Renal ROS     Musculoskeletal  (+) Arthritis , Left knee PJI   Abdominal   Peds  Hematology negative hematology ROS (+)   Anesthesia Other Findings Day of surgery medications reviewed with the patient.  Reproductive/Obstetrics                            Anesthesia Physical Anesthesia Plan  ASA: III  Anesthesia Plan: Spinal   Post-op Pain Management:  Regional for Post-op pain   Induction:   PONV Risk Score and Plan: 2 and Treatment may vary due to age or medical condition, Ondansetron, Propofol infusion, Dexamethasone and Midazolam  Airway Management Planned: Natural Airway and Simple Face Mask  Additional Equipment:   Intra-op Plan:   Post-operative Plan:   Informed Consent: I have reviewed the patients History and Physical, chart, labs and discussed the procedure including the risks, benefits and alternatives for the proposed anesthesia with the patient or authorized representative who has indicated his/her understanding and acceptance.     Dental  advisory given  Plan Discussed with: CRNA  Anesthesia Plan Comments:        Anesthesia Quick Evaluation

## 2018-09-17 NOTE — Progress Notes (Addendum)
Pt has declined use of CPAP QHS while here.  Pt to call if he changes his mind.  RT to monitor and assess as needed.

## 2018-09-17 NOTE — Anesthesia Procedure Notes (Signed)
Anesthesia Regional Block: Adductor canal block   Pre-Anesthetic Checklist: ,, timeout performed, Correct Patient, Correct Site, Correct Laterality, Correct Procedure, Correct Position, site marked, Risks and benefits discussed, pre-op evaluation,  At surgeon's request and post-op pain management  Laterality: Left  Prep: Maximum Sterile Barrier Precautions used, chloraprep       Needles:  Injection technique: Single-shot  Needle Type: Echogenic Stimulator Needle     Needle Length: 9cm  Needle Gauge: 22     Additional Needles:   Procedures:,,,, ultrasound used (permanent image in chart),,,,  Narrative:  Start time: 09/17/2018 11:36 AM End time: 09/17/2018 11:38 AM Injection made incrementally with aspirations every 5 mL.  Performed by: Personally  Anesthesiologist: [name], MD  Additional Notes: Risks, benefits, and alternative discussed. Patient gave consent for procedure. Patient prepped and draped in sterile fashion. Sedation administered, patient remains easily responsive to voice. Relevant anatomy identified with ultrasound guidance. Local anesthetic given in 5cc increments with no signs or symptoms of intravascular injection. No pain or paraesthesias with injection. Patient monitored throughout procedure with signs of LAST or immediate complications. Tolerated well. Ultrasound image placed in chart.  Tawny Asal, MD

## 2018-09-17 NOTE — Anesthesia Procedure Notes (Signed)
Procedure Name: MAC Date/Time: 09/17/2018 12:21 PM Performed by: Deliah Boston, CRNA Pre-anesthesia Checklist: Patient identified, Emergency Drugs available, Suction available and Patient being monitored Patient Re-evaluated:Patient Re-evaluated prior to induction Oxygen Delivery Method: Nasal cannula Preoxygenation: Pre-oxygenation with 100% oxygen Induction Type: IV induction Placement Confirmation: positive ETCO2 and breath sounds checked- equal and bilateral

## 2018-09-17 NOTE — Interval H&P Note (Signed)
History and Physical Interval Note:  09/17/2018 11:24 AM  Thomas [name]  has presented today for surgery, with the diagnosis of Infected left total knee.  The various methods of treatment have been discussed with the patient and family. After consideration of risks, benefits and other options for treatment, the patient has consented to  Procedure(s) with comments: Resection of left total knee arthroplasty and placement of antibiotic spacer (Left) - 90 mins as a surgical intervention.  The patient's history has been reviewed, patient examined, no change in status, stable for surgery.  I have reviewed the patient's chart and labs.  Questions were answered to the patient's satisfaction.     Mauri Pole

## 2018-09-18 ENCOUNTER — Encounter (HOSPITAL_COMMUNITY): Payer: Self-pay | Admitting: Orthopedic Surgery

## 2018-09-18 DIAGNOSIS — Z872 Personal history of diseases of the skin and subcutaneous tissue: Secondary | ICD-10-CM

## 2018-09-18 DIAGNOSIS — Z87311 Personal history of (healed) other pathological fracture: Secondary | ICD-10-CM

## 2018-09-18 DIAGNOSIS — F17220 Nicotine dependence, chewing tobacco, uncomplicated: Secondary | ICD-10-CM

## 2018-09-18 DIAGNOSIS — T8454XA Infection and inflammatory reaction due to internal left knee prosthesis, initial encounter: Principal | ICD-10-CM

## 2018-09-18 LAB — CBC
HCT: 36.4 % — ABNORMAL LOW (ref 39.0–52.0)
Hemoglobin: 11.7 g/dL — ABNORMAL LOW (ref 13.0–17.0)
MCH: 25.8 pg — ABNORMAL LOW (ref 26.0–34.0)
MCHC: 32.1 g/dL (ref 30.0–36.0)
MCV: 80.2 fL (ref 80.0–100.0)
Platelets: 105 10*3/uL — ABNORMAL LOW (ref 150–400)
RBC: 4.54 MIL/uL (ref 4.22–5.81)
RDW: 15.5 % (ref 11.5–15.5)
WBC: 7.7 10*3/uL (ref 4.0–10.5)
nRBC: 0 % (ref 0.0–0.2)

## 2018-09-18 LAB — BASIC METABOLIC PANEL
Anion gap: 8 (ref 5–15)
BUN: 7 mg/dL (ref 6–20)
CO2: 25 mmol/L (ref 22–32)
Calcium: 8.7 mg/dL — ABNORMAL LOW (ref 8.9–10.3)
Chloride: 102 mmol/L (ref 98–111)
Creatinine, Ser: 0.67 mg/dL (ref 0.61–1.24)
GFR calc Af Amer: >60 mL/min (ref >60)
GFR calc non Af Amer: >60 mL/min (ref >60)
Glucose, Bld: 243 mg/dL — ABNORMAL HIGH (ref 70–99)
Potassium: 4 mmol/L (ref 3.5–5.1)
Sodium: 135 mmol/L (ref 135–145)

## 2018-09-18 MED ORDER — OXYCODONE HCL 5 MG PO TABS: 10.0000 mg | ORAL_TABLET | ORAL | Status: DC | PRN

## 2018-09-18 MED ORDER — SODIUM CHLORIDE 0.9 % IV SOLN
3.0000 g | Freq: Four times a day (QID) | INTRAVENOUS | Status: DC
  Administered 2018-09-18 – 2018-09-20 (×7): 3 g via INTRAVENOUS
  Filled 2018-09-18 (×2): qty 8
  Filled 2018-09-18 (×6): qty 3

## 2018-09-18 MED ORDER — SODIUM CHLORIDE 0.9% FLUSH: 10.0000 mL | INTRAVENOUS | Status: DC | PRN

## 2018-09-18 MED ORDER — OXYCODONE HCL 5 MG PO TABS
20.0000 mg | ORAL_TABLET | ORAL | Status: DC | PRN
  Administered 2018-09-18 – 2018-09-22 (×23): 20 mg via ORAL
  Filled 2018-09-18 (×23): qty 4

## 2018-09-18 NOTE — Brief Op Note (Signed)
09/17/2018  12:06 PM  PATIENT:  Thomas [name]  48 y.o. male  PRE-OPERATIVE DIAGNOSIS:  Infected left total knee  POST-OPERATIVE DIAGNOSIS:  Infected left total knee  PROCEDURE:  Procedure(s) with comments: Resection of left total knee arthroplasty and placement of antibiotic spacer (Left) - 90 mins  SURGEON:  Surgeon(s) and Role:    Paralee Cancel, MD - Primary  PHYSICIAN ASSISTANT: Griffith Citron, PA-C  ANESTHESIA:   regional and general  EBL:  100 mL   BLOOD ADMINISTERED:none  DRAINS: none   LOCAL MEDICATIONS USED:  MARCAINE     SPECIMEN:  Source of Specimen:  left knee synovial fluid and synoival tissue  DISPOSITION OF SPECIMEN:  PATHOLOGY  COUNTS:  YES  TOURNIQUET:   Total Tourniquet Time Documented: Thigh (Left) - 88 minutes Total: Thigh (Left) - 88 minutes   DICTATION: .Other Dictation: Dictation Number [id number]  PLAN OF CARE: Admit to inpatient   PATIENT DISPOSITION:  PACU - hemodynamically stable.   Delay start of Pharmacological VTE agent (>24hrs) due to surgical blood loss or risk of bleeding: no

## 2018-09-18 NOTE — Progress Notes (Addendum)
     Subjective: 1 Day Post-Op Procedure(s) (LRB): Resection of left total knee arthroplasty and placement of antibiotic spacer (Left)   Patient reports pain as moderate/severe.  He doesn't feel that the current analgesic regimen is covering his pain and he has needed IV Dilaudid.   Otherwise no events throughout the night. Discussed the procedure an expectations moving forward.     Objective:   VITALS:   Vitals:   09/18/18 0616 09/18/18 0913  BP: 140/77 138/80  Pulse: 66 68  Resp: 18 16  Temp: 97.8 F (36.6 C) 97.9 F (36.6 C)  SpO2: 94% 95%    Dorsiflexion/Plantar flexion intact Incision: dressing C/D/I No cellulitis present Compartment soft  LABS Recent Labs    09/18/18 0258  HGB 11.7*  HCT 36.4*  WBC 7.7  PLT 105*    Recent Labs    09/18/18 0258  NA 135  K 4.0  BUN 7  CREATININE 0.67  GLUCOSE 243*     Assessment/Plan: 1 Day Post-Op Procedure(s) (LRB): Resection of left total knee arthroplasty and placement of antibiotic spacer (Left) Change analgesic med regimen ID Consult placed Foley cath d/c'ed Advance diet Up with therapy D/C IV fluids Discharge home Follow up in 2 weeks at [facility] Follow up with OLIN,Joscelynn Brutus D in 2 weeks.  Contact information:  [facility]  [address] [place] [address] [phone]     Morbid Obesity (BMI >40)  Estimated body mass index is 41.44 kg/m as calculated from the following:   Height as of this encounter: 5\' 10"  (1.778 m).   Weight as of this encounter: 131 kg. Patient also counseled that weight may inhibit the healing process Patient counseled that losing weight will help with future health issues        [place] Pugh. Perl Kerney   PAC  09/18/2018, 9:27 AM

## 2018-09-18 NOTE — Progress Notes (Signed)
Peripherally Inserted Central Catheter/Midline Placement  The IV Nurse has discussed with the patient and/or persons authorized to consent for the patient, the purpose of this procedure and the potential benefits and risks involved with this procedure.  The benefits include less needle sticks, lab draws from the catheter, and the patient may be discharged home with the catheter. Risks include, but not limited to, infection, bleeding, blood clot (thrombus formation), and puncture of an artery; nerve damage and irregular heartbeat and possibility to perform a PICC exchange if needed/ordered by physician.  Alternatives to this procedure were also discussed.  Bard Power PICC patient education guide, fact sheet on infection prevention and patient information card has been provided to patient /or left at bedside.    PICC/Midline Placement Documentation  PICC Single Lumen 91/69/45 PICC Right Basilic 41 cm 0 cm (Active)  Indication for Insertion or Continuance of Line Home intravenous therapies (PICC only) 09/18/18 1000  Exposed Catheter (cm) 0 cm 09/18/18 1000  Site Assessment Clean;Dry;Intact 09/18/18 1000  Line Status Flushed;Saline locked;Blood return noted 09/18/18 1000  Dressing Type Transparent;Securing device 09/18/18 1000  Dressing Status Clean;Dry;Intact;Antimicrobial disc in place 09/18/18 1000  Dressing Change Due 09/25/18 09/18/18 1000       Holley Bouche [place] 09/18/2018, 10:58 AM

## 2018-09-18 NOTE — Evaluation (Signed)
Physical Therapy Evaluation Patient Details Name: Thomas [name] MRN: [id number] DOB: 1970-07-01 Today's Date: 09/18/2018   History of Present Illness  48 y.o. male s/p L TKA resection with placement of ABX spacer. PMH of left knee UKA performed on 02/10/2017 by Dr. Durene RomansMatthew Olin, which was complicated by periprosthetic fracture requiring ORIF and instability with polyethylene dislocation due to the varus collapse. 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 He began having symptoms of left knee pain, swelling, and stiffness, now s/p resection arthroplasy with placement of antibiotic spacer.  Clinical Impression  Patient presents with decreased mobility due to decreased AROM and limited weight bearing on L LE.  Currently minguard to S for mobility.  He will benefit from skilled PT in the acute setting to allow return home with family support.  Likely not to need follow up PT at d/c.     Follow Up Recommendations No PT follow up    Equipment Recommendations  None recommended by PT    Recommendations for Other Services       Precautions / Restrictions Precautions Precautions: Fall Restrictions Weight Bearing Restrictions: Yes LLE Weight Bearing: Partial weight bearing LLE Partial Weight Bearing Percentage or Pounds: 50%      Mobility  Bed Mobility Overal bed mobility: Needs Assistance Bed Mobility: Supine to Sit     Supine to sit: Min assist     General bed mobility comments: for L LE  Transfers Overall transfer level: Needs assistance Equipment used: Rolling walker (2 wheeled) Transfers: Sit to/from Stand Sit to Stand: Supervision         General transfer comment: assist for safety  Ambulation/Gait Ambulation/Gait assistance: Supervision;Min guard Gait Distance (Feet): 150 Feet Assistive device: Rolling walker (2 wheeled) Gait Pattern/deviations: Step-to pattern;Decreased step length - right;Decreased stance time - left     General Gait  Details: pushes walker far out, but steps into middle of walker, cues for stopping walker to Medtronicmaintiain PWB status  Stairs            Wheelchair Mobility    Modified Rankin (Stroke Patients Only)       Balance Overall balance assessment: Mild deficits observed, not formally tested                                           Pertinent Vitals/Pain Pain Assessment: Faces Faces Pain Scale: Hurts little more Pain Location: L knee Pain Descriptors / Indicators: Sore Pain Intervention(s): Monitored during session;Repositioned;Ice applied    Home Living Family/patient expects to be discharged to:: Private residence   Available Help at Discharge: Family Type of Home: House Home Access: Stairs to enter Entrance Stairs-Rails: Right Entrance Stairs-Number of Steps: 2 Home Layout: One level Home Equipment: Environmental consultantWalker - 2 wheels;Cane - single point;Crutches      Prior Function Level of Independence: Independent               Hand Dominance        Extremity/Trunk Assessment   Upper Extremity Assessment Upper Extremity Assessment: Overall WFL for tasks assessed    Lower Extremity Assessment Lower Extremity Assessment: LLE deficits/detail LLE Deficits / Details: AROM grossly 5-45 in supine with ace wrap, able to lift leg antigravity and performs quad set       Communication   Communication: No difficulties  Cognition Arousal/Alertness: Awake/alert Behavior During Therapy: WFL for tasks assessed/performed Overall  Cognitive Status: Within Functional Limits for tasks assessed                                        General Comments      Exercises Total Joint Exercises Ankle Circles/Pumps: AROM;Both;5 reps;Seated Quad Sets: AROM;5 reps;Seated;Left Heel Slides: AROM;5 reps;Left;Supine Hip ABduction/ADduction: AROM;Left;5 reps;Supine Straight Leg Raises: AROM;5 reps;Left;Supine   Assessment/Plan    PT Assessment Patient needs  continued PT services  PT Problem List Decreased range of motion;Decreased balance;Decreased strength;Pain;Decreased mobility;Decreased knowledge of use of DME       PT Treatment Interventions DME instruction;Stair training;Therapeutic activities;Balance training;Gait training;Functional mobility training;Therapeutic exercise;Patient/family education    PT Goals (Current goals can be found in the Care Plan section)  Acute Rehab PT Goals Patient Stated Goal: to go home, heal knee PT Goal Formulation: With patient Time For Goal Achievement: 09/21/18 Potential to Achieve Goals: Good    Frequency Min 6X/week   Barriers to discharge        Co-evaluation               AM-PAC PT "6 Clicks" Mobility  Outcome Measure Help needed turning from your back to your side while in a flat bed without using bedrails?: None Help needed moving from lying on your back to sitting on the side of a flat bed without using bedrails?: A Little Help needed moving to and from a bed to a chair (including a wheelchair)?: A Little Help needed standing up from a chair using your arms (e.g., wheelchair or bedside chair)?: A Little Help needed to walk in hospital room?: A Little Help needed climbing 3-5 steps with a railing? : A Little 6 Click Score: 19    End of Session Equipment Utilized During Treatment: Gait belt Activity Tolerance: Patient tolerated treatment well Patient left: in chair;with call bell/phone within reach   PT Visit Diagnosis: Other abnormalities of gait and mobility (R26.89)    Time: 1255-1315 PT Time Calculation (min) (ACUTE ONLY): 20 min   Charges:   PT Evaluation $PT Eval Low Complexity: [place], [place] [phone] 09/18/2018   Reginia Naas 09/18/2018, 2:51 PM

## 2018-09-18 NOTE — Op Note (Addendum)
[name]: Thomas [name], Thomas [name] MEDICAL RECORD [id number] ACCOUNT [id number] DATE OF BIRTH:24-Nov-1970 FACILITY: [facility] LOCATION: [facility] PHYSICIAN:Pawan Knechtel Rosalia Hammers. Helene Bernstein, MD  OPERATIVE REPORT  DATE OF PROCEDURE:  09/17/2018  PREOPERATIVE DIAGNOSIS:  Infected left total knee revision.  POSTOPERATIVE DIAGNOSIS:  Infected left total knee revision.  PROCEDURE: 1.  Resection of infected left total knee replacement. 2.  Placement of an articulating antibiotic spacer.  SURGEON:  Durene RomansMatthew Ibrohim Simmers, MD  ASSISTANT:  Dennie BibleAshley Stinson PA-C.  Note that Ms. Adrian BlackwaterStinson was present for the entirety of the case from preoperative positioning, perioperative management of the operative extremity, general facilitation of the case and primary wound closure.  ANESTHESIA:  Regional plus general.  SPECIMENS:  Fluid from the joint were taken for Gram stain, culture evaluation to pathology.  Tissue was also sent.  DRAINS:  None.  ESTIMATED BLOOD LOSS:  Per anesthetic records.  TOURNIQUET:  Up for 88 minutes at 300 mmHg.  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  He had had a previous partial knee replacement, previous  open reduction internal fixation and subsequent revision surgery due to failure.  He presented to the office in the postoperative period with some erythematous change around his wound.  He had progressive worsening of symptoms.  Attempted aspiration in  the office did not reveal any fluid; however, sedimentation rate and C-reactive protein were elevated indicating concern for infection.  We discussed the need for surgical intervention.  The risks and benefits of the procedure were discussed.  The plan  for 2-stage procedure discussed.  The postoperative course of IV antibiotics and duration prior to proceeding with revision were all reviewed.  Consent was obtained for  benefit of fracture management of the infection.  DESCRIPTION OF PROCEDURE:  The patient was brought to the operative theater.  Once adequate anesthesia, preoperative antibiotics, Ancef was given.  He was positioned supine.  His left thigh tourniquet was placed.   Left lower extremity was then prepped  and draped in sterile fashion.  A timeout was performed identifying the patient, the planned procedure and extremity.  The leg was exsanguinated, tourniquet elevated to 300 mmHg.  His old incision was excised over the entire extent of about a 10-inch  incision.  Soft tissue planes created.  A median arthrotomy was made.  He was noted to have damage due to infectious appearing process to the medial retinacular region of his extensor mechanism inferior to the patella.  We obtained cultures.  I did  perform a large exposure and synovectomy.  Some synovium was sent for evaluation to pathology.  Once the knee was fully exposed and extensive synovectomy carried out medial suprapatellar and lateral, I used an oscillating saw and was able to undermine  the bone cement interface on the tibia.  I did the same on the femoral component.  The femoral component was removed without significant challenges or bone loss.  On the tibia side, I was able to remove the component without much bone loss.  Old cement  was removed around the femur and the tibia.  The canals were opened.  Once I had adequately debrided the knee, we irrigated the canals and the knee with at least initially with 3 liters normal saline solution pulse lavage with a canal brush irrigator.   While this was going on, we opened up on the clean part of the table.  A size  5 femur and a 25 mm patella or tibial polyethylene insert.  I then placed Irrisept  into the knee wound and allowed this to sit for 5 minutes.  We then mixed cement, 3 batches  of cement with 3 grams of vancomycin and 3.6 of tobramycin.  I then cemented the components onto the knee in an  imprecise fashion with the knee held in extension.  While this was curing, we irrigated the knee with another 3 liters normal saline solution.   The tourniquet was let down after 88 minutes.  The knee was brought to about 30-40 degrees of flexion.  The extensor mechanism was reapproximated using a combination of  #1 Stratafix suture.  The remaining wound was closed with 2-0 Vicryl and a running  Monocryl stitch.  The knee was then cleaned, dried and dressed sterilely with bulky dressing.  No drain placed.    The patient was brought to the recovery room in stable condition, tolerating the procedure well He will be admitted to the hospital.  We will get infectious disease consultation.  PICC line ordered.  AN/NUANCE  D:09/18/2018 T:09/18/2018 JOB:[id number]

## 2018-09-18 NOTE — Consult Note (Signed)
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 acetaminophen  1,000 mg Oral Q6H  . aspirin  81 mg Oral BID  . celecoxib  200 mg Oral BID  . docusate sodium  100 mg Oral BID  . escitalopram  20 mg Oral Daily  . ferrous sulfate  325 mg Oral BID WC  . metoprolol tartrate  100 mg Oral BID  . omeprazole  20 mg Oral QAC breakfast  . polyethylene glycol  17 g Oral BID  . pregabalin  300 mg Oral BID  . traZODone  200 mg Oral QHS  . verapamil  120 mg Oral Daily    Recommendations: 1. LT knee PJI - Pt underwent explantation of hardware and placement of an articulating ABX spacer on 09/18/2018. Operative cxs are pending, but the patient showed me pictures of a dog bite to his RT thigh last year that occurred ~2-3 weeks prior to his conversion to a LT TKA. He was given an unknown ABX (?augmentin) for 1 week following this dog bite. Given the concern for indolent infection with Pasteurella or Capnocytophaga, will add unasyn 3 gm IV q 6 hrs to his vancomycin dosed for a goal trough of 15-20. The latter would cover for more typical skin pathogen such as staph and strep. Check CRP to establish baseline post-operative level. Anticipate a 6 week duration of IV ABX as his hardware has now been explanted and an ABX spacer is now intact. Tentative ABX d/c date is 10/31/2018. Note: The patient has only [facility] coverage, so his IV ABX will require approval from an ID physician within the [place] system, which frequently delays hospital d/c until CM receives word that they will approve tx.  Assessment: The patient is a 48 y/o WM with h/o LT knee hemiarthroplasty complicated by periprosthetic fracture, s/o ORIF and later  conversion to total LT knee arthroplasty shortly after he suffered a dog bite to the same leg now admitted with LT knee prosthetic joint infection, s/p explantation and placement of an articulating ABX spacer and leukocytosis.  Antibiotics: Vancomycin, day 1  HPI: Violet Cart is a 48 y.o. 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 He initially underwent a LT knee hemiarthroplasty in 12/18. His original hardware was compromised when he suffered a periprosthetic fracture in 4/19 requiring ORIF. As he was preparing to undergo conversion of his hemiarthroplasty to a LT knee TKA, he was the victim of a dog bite to his LT thigh in early July 2019. He states he took a PO ABX (?augmetin) for 7 days which improvement in the LT thigh cellulitis surrounding his bite wound. He then underwent conversion to LT TKA on 09/25/17. He reports the gradual onset of sxs of LT knee pain, swelling, and stiffness ever since his 7/19 operation. Per report, an OP bone scan was suggestive of infection on 08/13/2018 and his CRP elevated to 4.1 per report (studies unavailable for my review, unfortunately).  He was taken to the OR on 09/17/2018 for hardware extraction and placement of a vanc/tobra articulating ABX  spacer placement. He was empirically started on vancomycin post-operatively. He denies any recent PO ABX, fevers,  chills, or drainage/erythema to his LT knee prior to his admission here. He denies any recent falls, direct or blunt trauma to his LT knee in the past 4-5 months. Fever curve, WBC trends, cx results, and op notes all independently reviewed  Review of Systems:  Review of Systems  Constitutional: Negative for chills, fever and weight loss.  HENT: Negative for congestion, hearing loss, sinus pain and sore throat.   Eyes: Negative for blurred vision, photophobia and discharge.  Respiratory: Negative for cough, hemoptysis and shortness of breath.    Cardiovascular: Negative for chest pain, palpitations, orthopnea and leg swelling.  Gastrointestinal: Negative for abdominal pain, constipation, diarrhea, heartburn, nausea and vomiting.  Genitourinary: Negative for dysuria, flank pain, frequency and urgency.  Musculoskeletal: Positive for joint pain. Negative for back pain and myalgias.       LT knee  Skin: Negative for itching and rash.  Neurological: Negative for tremors, seizures, weakness and headaches.  Endo/Heme/Allergies: Negative for polydipsia. Does not bruise/bleed easily.  Psychiatric/Behavioral: Negative for depression and substance abuse. The patient is not nervous/anxious and does not have insomnia.      All other systems reviewed and are negative    Past Medical History:  Diagnosis Date  . Anxiety    occasionally  . Arthritis   . Chest pain   . GERD (gastroesophageal reflux disease)   . HTN (hypertension)   . Pre-diabetes   . Sleep apnea    cpap   . SVT (supraventricular tachycardia) (HCC)     Social History   Tobacco Use  . Smoking status: Never Smoker  . Smokeless tobacco: Current User    Types: Chew, Snuff  . Tobacco comment: 30 years ago   Substance Use Topics  . Alcohol use: Not Currently    Alcohol/week: 12.0 standard drinks    Types: 12 Standard drinks or equivalent per week    Frequency: Never    Comment: no  . Drug use: No    History reviewed. No pertinent family history.   Current Facility-Administered Medications:  .  0.9 %  sodium chloride infusion, , Intravenous, Continuous, Babish, Molli HazardMatthew, PA-C, Stopped at 09/18/18 1300 .  acetaminophen (TYLENOL) tablet 1,000 mg, 1,000 mg, Oral, Q6H, Babish, Matthew, PA-C, 1,000 mg at 09/18/18 0500 .  alum & mag hydroxide-simeth (MAALOX/MYLANTA) 200-200-20 MG/5ML suspension 15 mL, 15 mL, Oral, Q4H PRN, Babish, Matthew, PA-C .  aspirin chewable tablet 81 mg, 81 mg, Oral, BID, Lanney GinsBabish, Matthew, PA-C, 81 mg at 09/18/18 0910 .  bisacodyl (DULCOLAX)  suppository 10 mg, 10 mg, Rectal, Daily PRN, Babish, Matthew, PA-C .  celecoxib (CELEBREX) capsule 200 mg, 200 mg, Oral, BID, Babish, Matthew, PA-C, 200 mg at 09/18/18 0905 .  diphenhydrAMINE (BENADRYL) 12.5 MG/5ML elixir 12.5-25 mg, 12.5-25 mg, Oral, Q4H PRN, Babish, Matthew, PA-C .  docusate sodium (COLACE) capsule 100 mg, 100 mg, Oral, BID, Lanney GinsBabish, Matthew, PA-C, 100 mg at 09/18/18 0904 .  escitalopram (LEXAPRO) tablet 20 mg, 20 mg, Oral, Daily, Lanney GinsBabish, Matthew, PA-C, 20 mg at 09/18/18 0904 .  ferrous sulfate tablet 325 mg, 325 mg, Oral, BID WC, Lanney GinsBabish, Matthew, PA-C, 325 mg at 09/18/18 [id number] .  HYDROmorphone (DILAUDID) injection 0.5-1 mg, 0.5-1 mg, Intravenous, Q2H PRN, Babish, Matthew, PA-C, 1 mg at 09/18/18 0241 .  HYDROmorphone (DILAUDID) injection 0.5-1 mg, 0.5-1 mg, Intravenous, Q4H PRN, Babish, Matthew, PA-C .  hydrOXYzine (ATARAX/VISTARIL) tablet 50 mg, 50 mg, Oral, QHS PRN, Babish, Matthew, PA-C .  magnesium citrate solution 1 Bottle, 1 Bottle, Oral, Once PRN,  Lanney GinsBabish, Matthew, PA-C .  menthol-cetylpyridinium (CEPACOL) lozenge 3 mg, 1 lozenge, Oral, PRN **OR** phenol (CHLORASEPTIC) mouth spray 1 spray, 1 spray, Mouth/Throat, PRN, Babish, Matthew, PA-C .  methocarbamol (ROBAXIN) tablet 500 mg, 500 mg, Oral, Q6H PRN, 500 mg at 09/18/18 0500 **OR** methocarbamol (ROBAXIN) 500 mg in dextrose 5 % 50 mL IVPB, 500 mg, Intravenous, Q6H PRN, Lanney GinsBabish, Matthew, PA-C, Stopped at 09/17/18 1650 .  metoCLOPramide (REGLAN) tablet 5-10 mg, 5-10 mg, Oral, Q8H PRN **OR** metoCLOPramide (REGLAN) injection 5-10 mg, 5-10 mg, Intravenous, Q8H PRN, Babish, Matthew, PA-C .  metoprolol tartrate (LOPRESSOR) tablet 100 mg, 100 mg, Oral, BID, Babish, Matthew, PA-C, 100 mg at 09/18/18 0904 .  omeprazole (PRILOSEC) capsule 20 mg, 20 mg, Oral, QAC breakfast, Lanney GinsBabish, Matthew, PA-C, 20 mg at 09/18/18 0904 .  ondansetron (ZOFRAN) tablet 4 mg, 4 mg, Oral, Q6H PRN **OR** ondansetron (ZOFRAN) injection 4 mg, 4 mg, Intravenous,  Q6H PRN, Babish, Matthew, PA-C .  oxyCODONE (Oxy IR/ROXICODONE) immediate release tablet 10 mg, 10 mg, Oral, Q4H PRN, Babish, Matthew, PA-C .  oxyCODONE (Oxy IR/ROXICODONE) immediate release tablet 20 mg, 20 mg, Oral, Q4H PRN, Babish, Matthew, PA-C, 20 mg at 09/18/18 1505 .  polyethylene glycol (MIRALAX / GLYCOLAX) packet 17 g, 17 g, Oral, BID, Lanney GinsBabish, Matthew, PA-C, 17 g at 09/18/18 0905 .  pregabalin (LYRICA) capsule 300 mg, 300 mg, Oral, BID, Lanney GinsBabish, Matthew, PA-C, 300 mg at 09/18/18 0905 .  sodium chloride flush (NS) 0.9 % injection 10-40 mL, 10-40 mL, Intracatheter, PRN, Durene Romanslin, Matthew, MD .  traZODone (DESYREL) tablet 200 mg, 200 mg, Oral, QHS, Babish, Matthew, PA-C, 200 mg at 09/17/18 2318 .  vancomycin (VANCOCIN) 1,500 mg in sodium chloride 0.9 % 500 mL IVPB, 1,500 mg, Intravenous, Q12H, Cindi CarbonSwayne, Mary M, RPH, Stopped at 09/18/18 [id number] .  verapamil (CALAN-SR) CR tablet 120 mg, 120 mg, Oral, Daily, Lanney GinsBabish, Matthew, PA-C, 120 mg at 09/18/18 0904  No Known Allergies  Vitals:   09/18/18 0913 09/18/18 1405  BP: 138/80 (!) 154/88  Pulse: 68 70  Resp: 16 16  Temp: 97.9 F (36.6 C) (!) 97.5 F (36.4 C)  SpO2: 95% 94%     Physical Exam Gen: pleasant, obese, NAD, A&Ox 3 Head: NCAT, no temporal wasting evident EENT: PERRL, EOMI, MMM, adequate dentition Neck: supple, no JVD CV: NRRR, no murmurs evident Pulm: CTA bilaterally, no wheeze or retractions Abd: soft, obese, NTND, +BS MSK: LT knee wound c/d/i with compressive ACE bandage overlying Extrems: trace LT LE edema, 2+ pulses Skin: no rashes, adequate skin turgor Neuro: CN II-XII grossly intact, no focal neurologic deficits appreciated, gait was not assessed, A&Ox 3    Lab Results  Component Value Date   WBC 7.7 09/18/2018   HGB 11.7 (L) 09/18/2018   HCT 36.4 (L) 09/18/2018   MCV 80.2 09/18/2018   PLT 105 (L) 09/18/2018    Lab Results  Component Value Date   CREATININE 0.67 09/18/2018   BUN 7 09/18/2018   NA 135 09/18/2018    K 4.0 09/18/2018   CL 102 09/18/2018   CO2 25 09/18/2018    Lab Results  Component Value Date   ALT 28 09/20/2017   AST 27 09/20/2017   ALKPHOS 70 09/20/2017     Microbiology: Recent Results (from the past 240 hour(s))  Surgical pcr screen     Status: None   Collection Time: 09/13/18  8:56 AM   Specimen: Vein; Nasal Swab  Result Value Ref Range Status   MRSA, PCR NEGATIVE NEGATIVE  Final   Staphylococcus aureus NEGATIVE NEGATIVE Final    Comment: (NOTE) The Xpert SA Assay (FDA approved for NASAL specimens in patients 50 years of age and older), is one component of a comprehensive surveillance program. It is not intended to diagnose infection nor to guide or monitor treatment. Performed at [facility], [address] [place], [place] [address]   SARS Coronavirus 2 (Performed in [facility] hospital lab)     Status: None   Collection Time: 09/13/18  9:42 AM   Specimen: Nasal Swab  Result Value Ref Range Status   SARS Coronavirus 2 NEGATIVE NEGATIVE Final    Comment: (NOTE) SARS-CoV-2 target nucleic acids are NOT DETECTED. The SARS-CoV-2 RNA is generally detectable in upper and lower respiratory specimens during the acute phase of infection. Negative results do not preclude SARS-CoV-2 infection, do not rule out co-infections with other pathogens, and should not be used as the sole basis for treatment or other patient management decisions. Negative results must be combined with clinical observations, patient history, and epidemiological information. The expected result is Negative. Fact Sheet for Patients: HairSlick.nohttps://www.fda.gov/media/138098/download Fact Sheet for Healthcare Providers: quierodirigir.comhttps://www.fda.gov/media/138095/download This test is not yet approved or cleared by the [place] FDA and  has been authorized for detection and/or diagnosis of SARS-CoV-2 by FDA under an Emergency Use Authorization (EUA). This EUA will remain  in effect (meaning  this test can be used) for the duration of the COVID-19 declaration under Section 56 4(b)(1) of the Act, 21 U.S.C. section 360bbb-3(b)(1), unless the authorization is terminated or revoked sooner. Performed at [facility], [address] [place], [place] [address]   Body fluid culture     Status: None (Preliminary result)   Collection Time: 09/17/18  1:07 PM   Specimen: Synovium  Result Value Ref Range Status   Specimen Description   Final    SYNOVIAL LT KNEE Performed at [facility], [address] [place], [place] [address]    Special Requests   Final    NONE Performed at [facility], [address] [place], [place] [address]    Gram Stain   Final    FEW WBC PRESENT, PREDOMINANTLY PMN NO ORGANISMS SEEN    Culture   Final    CULTURE REINCUBATED FOR BETTER GROWTH Performed at [facility], [address] [place], [place] [address]    Report Status PENDING  Incomplete    Canalou LionsJames N , MD [facility] [email or web address] 09/18/2018, 5:03 PM

## 2018-09-19 DIAGNOSIS — K5900 Constipation, unspecified: Secondary | ICD-10-CM

## 2018-09-19 DIAGNOSIS — Z95828 Presence of other vascular implants and grafts: Secondary | ICD-10-CM

## 2018-09-19 DIAGNOSIS — D72829 Elevated white blood cell count, unspecified: Secondary | ICD-10-CM

## 2018-09-19 LAB — BASIC METABOLIC PANEL
Anion gap: 7 (ref 5–15)
BUN: 13 mg/dL (ref 6–20)
CO2: 30 mmol/L (ref 22–32)
Calcium: 8.4 mg/dL — ABNORMAL LOW (ref 8.9–10.3)
Chloride: 99 mmol/L (ref 98–111)
Creatinine, Ser: 0.74 mg/dL (ref 0.61–1.24)
GFR calc Af Amer: >60 mL/min (ref >60)
GFR calc non Af Amer: >60 mL/min (ref >60)
Glucose, Bld: 141 mg/dL — ABNORMAL HIGH (ref 70–99)
Potassium: 4.7 mmol/L (ref 3.5–5.1)
Sodium: 136 mmol/L (ref 135–145)

## 2018-09-19 LAB — CBC WITH DIFFERENTIAL/PLATELET
Abs Immature Granulocytes: 0.09 10*3/uL — ABNORMAL HIGH (ref 0.00–0.07)
Basophils Absolute: 0 10*3/uL (ref 0.0–0.1)
Basophils Relative: 0 %
Eosinophils Absolute: 0 10*3/uL (ref 0.0–0.5)
Eosinophils Relative: 0 %
HCT: 33.8 % — ABNORMAL LOW (ref 39.0–52.0)
Hemoglobin: 10.7 g/dL — ABNORMAL LOW (ref 13.0–17.0)
Immature Granulocytes: 1 %
Lymphocytes Relative: 14 %
Lymphs Abs: 2 10*3/uL (ref 0.7–4.0)
MCH: 25.7 pg — ABNORMAL LOW (ref 26.0–34.0)
MCHC: 31.7 g/dL (ref 30.0–36.0)
MCV: 81.3 fL (ref 80.0–100.0)
Monocytes Absolute: 1.2 10*3/uL — ABNORMAL HIGH (ref 0.1–1.0)
Monocytes Relative: 9 %
Neutro Abs: 11.1 10*3/uL — ABNORMAL HIGH (ref 1.7–7.7)
Neutrophils Relative %: 76 %
Platelets: 172 10*3/uL (ref 150–400)
RBC: 4.16 MIL/uL — ABNORMAL LOW (ref 4.22–5.81)
RDW: 16.1 % — ABNORMAL HIGH (ref 11.5–15.5)
WBC: 14.5 10*3/uL — ABNORMAL HIGH (ref 4.0–10.5)
nRBC: 0 % (ref 0.0–0.2)

## 2018-09-19 LAB — C-REACTIVE PROTEIN: CRP: 2.3 mg/dL — ABNORMAL HIGH (ref <1.0)

## 2018-09-19 LAB — VANCOMYCIN, PEAK: Vancomycin Pk: 21 ug/mL — ABNORMAL LOW (ref 30–40)

## 2018-09-19 NOTE — Progress Notes (Signed)
Physical Therapy Progress Note  Clinical Impression: Pt seen for LE strengthening therex. PT provided pt education re: ice, positioning of LE, car transfers with demonstration and home safety. Pt would continue to benefit from skilled physical therapy services at this time while admitted and after d/c to address the below listed limitations in order to improve overall safety and independence with functional mobility.  Sherie Don, [place], DPT  Acute Rehabilitation Services Pager [phone] Office [phone]    09/19/18 1600  PT Visit Information  Last PT Received On 09/19/18  Assistance Needed +1  History of Present Illness Pt is a 48 y.o. male s/p L TKA resection with placement of ABX spacer. PMH of left knee UKA performed on 02/10/2017 by Dr. 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 He began having symptoms of left knee pain, swelling, and stiffness, now s/p resection arthroplasy with placement of antibiotic spacer.  Precautions  Precautions Fall;Knee  Restrictions  Weight Bearing Restrictions Yes  LLE Weight Bearing PWB  LLE Partial Weight Bearing Percentage or Pounds 50%  Pain Assessment  Pain Assessment 0-10  Pain Score 8  Pain Location L knee  Pain Descriptors / Indicators Sore  Pain Intervention(s) Premedicated before session;Monitored during session;Repositioned  Cognition  Arousal/Alertness Awake/alert  Behavior During Therapy WFL for tasks assessed/performed  Overall Cognitive Status Within Functional Limits for tasks assessed  Exercises  Exercises Total Joint  Total Joint Exercises  Ankle Circles/Pumps AROM;Both;20 reps;Supine  Quad Sets AROM;Strengthening;Left;10 reps;Supine  Heel Slides AROM;Strengthening;Left;10 reps;Supine  Hip ABduction/ADduction AROM;Strengthening;Left;10  reps;Supine  Straight Leg Raises AROM;Strengthening;Left;10 reps;Supine  PT - End of Session  Activity Tolerance Patient tolerated treatment well  Patient left in bed;with call bell/phone within reach  Nurse Communication Mobility status   PT - Assessment/Plan  PT Plan Current plan remains appropriate  PT Visit Diagnosis Other abnormalities of gait and mobility (R26.89)  PT Frequency (ACUTE ONLY) Min 6X/week  Follow Up Recommendations No PT follow up  PT equipment None recommended by PT  AM-PAC PT "6 Clicks" Mobility Outcome Measure (Version 2)  Help needed turning from your back to your side while in a flat bed without using bedrails? 4  Help needed moving from lying on your back to sitting on the side of a flat bed without using bedrails? 3  Help needed moving to and from a bed to a chair (including a wheelchair)? 3  Help needed standing up from a chair using your arms (e.g., wheelchair or bedside chair)? 3  Help needed to walk in hospital room? 3  Help needed climbing 3-5 steps with a railing?  3  6 Click Score 19  Consider Recommendation of Discharge To: Home with [facility]  PT Goal Progression  Progress towards PT goals Progressing toward goals  Acute Rehab PT Goals  PT Goal Formulation With patient  Time For Goal Achievement 09/21/18  Potential to Achieve Goals Good  PT Time Calculation  PT Start Time (ACUTE ONLY) 1546  PT Stop Time (ACUTE ONLY) 1604  PT Time Calculation (min) (ACUTE ONLY) 18 min  PT General Charges  $$ ACUTE PT VISIT 1 Visit  PT Treatments  $Therapeutic Exercise 8-22 mins

## 2018-09-19 NOTE — Progress Notes (Signed)
Physical Therapy Treatment Patient Details Name: Thomas [name] MRN: [id number] DOB: [birth year] Today's Date: 09/19/2018    History of Present Illness 48 y.o. male s/p L TKA resection with placement of ABX spacer. PMH of left knee UKA performed on 02/10/2017 by Dr. Durene RomansMatthew Olin, which was complicated by periprosthetic fracture requiring ORIF and instability with polyethylene dislocation due to the varus collapse. 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 He began having symptoms of left knee pain, swelling, and stiffness, now s/p resection arthroplasy with placement of antibiotic spacer.    PT Comments    Pt continues motivated and progressed this pm to negotiate stairs   Follow Up Recommendations  No PT follow up     Equipment Recommendations  None recommended by PT    Recommendations for Other Services       Precautions / Restrictions Precautions Precautions: Fall Restrictions LLE Weight Bearing: Partial weight bearing LLE Partial Weight Bearing Percentage or Pounds: 50%    Mobility  Bed Mobility Overal bed mobility: Needs Assistance Bed Mobility: Supine to Sit;Sit to Supine     Supine to sit: Supervision Sit to supine: Supervision   General bed mobility comments: for L LE  Transfers Overall transfer level: Needs assistance Equipment used: Rolling walker (2 wheeled) Transfers: Sit to/from Stand Sit to Stand: Supervision         General transfer comment: cues for use of UEs   Ambulation/Gait Ambulation/Gait assistance: Supervision;Min guard Gait Distance (Feet): 120 Feet Assistive device: Rolling walker (2 wheeled) Gait Pattern/deviations: Step-to pattern;Decreased step length - right;Decreased stance time - left Gait velocity: decr   General Gait Details: cues for posture, position from RW and PWB   Stairs Stairs: Yes Stairs assistance: Min assist Stair Management: One rail Left;Step to pattern;Forwards;With  crutches Number of Stairs: 4 General stair comments: cues for sequence, foot/crutch placement and placement of crutch under arm to avoid PICC line   Wheelchair Mobility    Modified Rankin (Stroke Patients Only)       Balance Overall balance assessment: Mild deficits observed, not formally tested                                          Cognition Arousal/Alertness: Awake/alert Behavior During Therapy: WFL for tasks assessed/performed Overall Cognitive Status: Within Functional Limits for tasks assessed                                        Exercises Total Joint Exercises Ankle Circles/Pumps: AROM;Both;Seated;15 reps Quad Sets: AROM;Seated;Left;10 reps Heel Slides: AROM;Left;Supine;10 reps Hip ABduction/ADduction: AROM;Left;Supine;10 reps Straight Leg Raises: AROM;Left;Supine;10 reps    General Comments        Pertinent Vitals/Pain Pain Assessment: 0-10 Pain Score: 6  Pain Location: L knee Pain Descriptors / Indicators: Sore Pain Intervention(s): Limited activity within patient's tolerance;Monitored during session;Premedicated before session    Home Living                      Prior Function            PT Goals (current goals can now be found in the care plan section) Acute Rehab PT Goals Patient Stated Goal: to go home, heal knee PT Goal Formulation: With patient Time For Goal Achievement: 09/21/18 Potential to  Achieve Goals: Good Progress towards PT goals: Progressing toward goals    Frequency    Min 6X/week      PT Plan Current plan remains appropriate    Co-evaluation              AM-PAC PT "6 Clicks" Mobility   Outcome Measure  Help needed turning from your back to your side while in a flat bed without using bedrails?: None Help needed moving from lying on your back to sitting on the side of a flat bed without using bedrails?: A Little Help needed moving to and from a bed to a chair  (including a wheelchair)?: A Little Help needed standing up from a chair using your arms (e.g., wheelchair or bedside chair)?: A Little Help needed to walk in hospital room?: A Little Help needed climbing 3-5 steps with a railing? : A Little 6 Click Score: 19    End of Session Equipment Utilized During Treatment: Gait belt Activity Tolerance: Patient tolerated treatment well Patient left: in bed;with call bell/phone within reach Nurse Communication: Mobility status PT Visit Diagnosis: Other abnormalities of gait and mobility (R26.89)     Time: 2505-3976 PT Time Calculation (min) (ACUTE ONLY): 22 min  Charges:  $Gait Training: 8-22 mins $Therapeutic Exercise: 8-22 mins                     [place] Pager [phone] Office [phone]    Thomas [name] 09/19/2018, 1:54 PM

## 2018-09-19 NOTE — Progress Notes (Signed)
Physical Therapy Treatment Patient Details Name: Thomas [name] MRN: [id number] DOB: 07-Mar-1970 Today's Date: 09/19/2018    History of Present Illness 48 y.o. male s/p L TKA resection with placement of ABX spacer. PMH of left knee UKA performed on 02/10/2017 by Dr. Durene RomansMatthew Olin, which was complicated by periprosthetic fracture requiring ORIF and instability with polyethylene dislocation due to the varus collapse. 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 He began having symptoms of left knee pain, swelling, and stiffness, now s/p resection arthroplasy with placement of antibiotic spacer.    PT Comments    Pt progressing well with mobility and hopeful for dc by this weekend   Follow Up Recommendations  No PT follow up     Equipment Recommendations  None recommended by PT    Recommendations for Other Services       Precautions / Restrictions Precautions Precautions: Fall Restrictions LLE Weight Bearing: Partial weight bearing LLE Partial Weight Bearing Percentage or Pounds: 50%    Mobility  Bed Mobility Overal bed mobility: Needs Assistance Bed Mobility: Supine to Sit     Supine to sit: Min guard     General bed mobility comments: for L LE  Transfers Overall transfer level: Needs assistance Equipment used: Rolling walker (2 wheeled) Transfers: Sit to/from Stand Sit to Stand: Supervision         General transfer comment: assist for safety  Ambulation/Gait Ambulation/Gait assistance: Supervision;Min guard Gait Distance (Feet): 160 Feet Assistive device: Rolling walker (2 wheeled) Gait Pattern/deviations: Step-to pattern;Decreased step length - right;Decreased stance time - left Gait velocity: decr   General Gait Details: cues for posture, position from RW and PWB   Stairs             Wheelchair Mobility    Modified Rankin (Stroke Patients Only)       Balance Overall balance assessment: Mild deficits observed, not  formally tested                                          Cognition Arousal/Alertness: Awake/alert Behavior During Therapy: WFL for tasks assessed/performed Overall Cognitive Status: Within Functional Limits for tasks assessed                                        Exercises Total Joint Exercises Ankle Circles/Pumps: AROM;Both;Seated;15 reps Quad Sets: AROM;Seated;Left;10 reps Heel Slides: AROM;Left;Supine;10 reps Hip ABduction/ADduction: AROM;Left;Supine;10 reps Straight Leg Raises: AROM;Left;Supine;10 reps    General Comments        Pertinent Vitals/Pain Pain Assessment: 0-10 Pain Score: 6  Pain Location: L knee Pain Descriptors / Indicators: Sore Pain Intervention(s): Limited activity within patient's tolerance;Monitored during session;Premedicated before session;Ice applied    Home Living                      Prior Function            PT Goals (current goals can now be found in the care plan section) Acute Rehab PT Goals Patient Stated Goal: to go home, heal knee PT Goal Formulation: With patient Time For Goal Achievement: 09/21/18 Potential to Achieve Goals: Good Progress towards PT goals: Progressing toward goals    Frequency    Min 6X/week      PT Plan Current plan remains  appropriate    Co-evaluation              AM-PAC PT "6 Clicks" Mobility   Outcome Measure  Help needed turning from your back to your side while in a flat bed without using bedrails?: None Help needed moving from lying on your back to sitting on the side of a flat bed without using bedrails?: A Little Help needed moving to and from a bed to a chair (including a wheelchair)?: A Little Help needed standing up from a chair using your arms (e.g., wheelchair or bedside chair)?: A Little Help needed to walk in hospital room?: A Little Help needed climbing 3-5 steps with a railing? : A Little 6 Click Score: 19    End of Session  Equipment Utilized During Treatment: Gait belt Activity Tolerance: Patient tolerated treatment well Patient left: in chair;with call bell/phone within reach Nurse Communication: Mobility status PT Visit Diagnosis: Other abnormalities of gait and mobility (R26.89)     Time: 0626-9485 PT Time Calculation (min) (ACUTE ONLY): 29 min  Charges:  $Gait Training: 8-22 mins $Therapeutic Exercise: 8-22 mins                     Thomas [name] Thomas [name]    Thomas [name] 09/19/2018, 1:05 PM

## 2018-09-19 NOTE — Progress Notes (Signed)
[facility]   Reason for visit: Follow up on LT knee prosthetic joint infection  Antibiotics: Vancomycin, day 2 Unasyn, day 2  Interval History: no BM since surgery. Appetite is good. Ambulated with walker in hallway with PT assistance. PICC line now intact. Fever curve, WBC trends, cx results, and op notes all independently reviewed    Current Facility-Administered Medications:  .  0.9 %  sodium chloride infusion, , Intravenous, Continuous, Shelly CossBabish, Matthew, PA-C, Stopped at 09/19/18 0407 .  alum & mag hydroxide-simeth (MAALOX/MYLANTA) 200-200-20 MG/5ML suspension 15 mL, 15 mL, Oral, Q4H PRN, Lanney GinsBabish, Matthew, PA-C .  Ampicillin-Sulbactam (UNASYN) 3 g in sodium chloride 0.9 % 100 mL IVPB, 3 g, Intravenous, Q6H, Powers, Arley PhenixJames N, MD, Stopped at 09/19/18 1233 .  aspirin chewable tablet 81 mg, 81 mg, Oral, BID, Lanney GinsBabish, Matthew, PA-C, 81 mg at 09/19/18 [id number] .  bisacodyl (DULCOLAX) suppository 10 mg, 10 mg, Rectal, Daily PRN, Babish, Matthew, PA-C .  celecoxib (CELEBREX) capsule 200 mg, 200 mg, Oral, BID, Babish, Matthew, PA-C, 200 mg at 09/19/18 [id number] .  diphenhydrAMINE (BENADRYL) 12.5 MG/5ML elixir 12.5-25 mg, 12.5-25 mg, Oral, Q4H PRN, Babish, Matthew, PA-C .  docusate sodium (COLACE) capsule 100 mg, 100 mg, Oral, BID, Lanney GinsBabish, Matthew, PA-C, 100 mg at 09/19/18 0839 .  escitalopram (LEXAPRO) tablet 20 mg, 20 mg, Oral, Daily, Lanney GinsBabish, Matthew, PA-C, 20 mg at 09/19/18 0839 .  ferrous sulfate tablet 325 mg, 325 mg, Oral, BID WC, Lanney GinsBabish, Matthew, PA-C, 325 mg at 09/19/18 1637 .  HYDROmorphone (DILAUDID) injection 0.5-1 mg, 0.5-1 mg, Intravenous, Q2H PRN, Babish, Matthew, PA-C, 1 mg at 09/18/18 0241 .  HYDROmorphone (DILAUDID) injection 0.5-1 mg, 0.5-1 mg, Intravenous, Q4H PRN, Babish, Matthew, PA-C .  hydrOXYzine (ATARAX/VISTARIL) tablet 50 mg, 50 mg, Oral, QHS PRN, Babish, Matthew, PA-C .  magnesium citrate solution 1 Bottle, 1 Bottle, Oral, Once PRN, Babish, Matthew,  PA-C .  menthol-cetylpyridinium (CEPACOL) lozenge 3 mg, 1 lozenge, Oral, PRN **OR** phenol (CHLORASEPTIC) mouth spray 1 spray, 1 spray, Mouth/Throat, PRN, Babish, Matthew, PA-C .  methocarbamol (ROBAXIN) tablet 500 mg, 500 mg, Oral, Q6H PRN, 500 mg at 09/19/18 1637 **OR** methocarbamol (ROBAXIN) 500 mg in dextrose 5 % 50 mL IVPB, 500 mg, Intravenous, Q6H PRN, Lanney GinsBabish, Matthew, PA-C, Stopped at 09/17/18 1650 .  metoCLOPramide (REGLAN) tablet 5-10 mg, 5-10 mg, Oral, Q8H PRN **OR** metoCLOPramide (REGLAN) injection 5-10 mg, 5-10 mg, Intravenous, Q8H PRN, Babish, Matthew, PA-C .  metoprolol tartrate (LOPRESSOR) tablet 100 mg, 100 mg, Oral, BID, Babish, Matthew, PA-C, 100 mg at 09/19/18 0839 .  omeprazole (PRILOSEC) capsule 20 mg, 20 mg, Oral, QAC breakfast, Babish, Matthew, PA-C, 20 mg at 09/19/18 0839 .  ondansetron (ZOFRAN) tablet 4 mg, 4 mg, Oral, Q6H PRN **OR** ondansetron (ZOFRAN) injection 4 mg, 4 mg, Intravenous, Q6H PRN, Babish, Matthew, PA-C .  oxyCODONE (Oxy IR/ROXICODONE) immediate release tablet 10 mg, 10 mg, Oral, Q4H PRN, Babish, Matthew, PA-C .  oxyCODONE (Oxy IR/ROXICODONE) immediate release tablet 20 mg, 20 mg, Oral, Q4H PRN, Babish, Matthew, PA-C, 20 mg at 09/19/18 1543 .  polyethylene glycol (MIRALAX / GLYCOLAX) packet 17 g, 17 g, Oral, BID, Lanney GinsBabish, Matthew, PA-C, 17 g at 09/19/18 0834 .  pregabalin (LYRICA) capsule 300 mg, 300 mg, Oral, BID, Lanney GinsBabish, Matthew, PA-C, 300 mg at 09/19/18 0839 .  sodium chloride flush (NS) 0.9 % injection 10-40 mL, 10-40 mL, Intracatheter, PRN, Durene Romanslin, Matthew, MD .  traZODone (DESYREL) tablet 200 mg, 200 mg, Oral, QHS, Lanney GinsBabish, Matthew, PA-C,  200 mg at 09/18/18 2000 .  vancomycin (VANCOCIN) 1,500 mg in sodium chloride 0.9 % 500 mL IVPB, 1,500 mg, Intravenous, Q12H, Lenis Noon, [facility], Last Rate: 250 mL/hr at 09/19/18 0545, 1,500 mg at 09/19/18 0545 .  verapamil (CALAN-SR) CR tablet 120 mg, 120 mg, Oral, Daily, Danae Orleans, PA-C, 120 mg at 09/19/18 0839    Physical Exam:   Vitals:   09/19/18 0837 09/19/18 1324  BP: 127/79 140/82  Pulse: 79 69  Resp:  16  Temp:  (!) 97.5 F (36.4 C)  SpO2:  95%   Physical Exam Lines: RT arm PICC Gen: pleasant, obese, NAD, A&Ox 3 Head: NCAT, no temporal wasting evident EENT: PERRL, EOMI, MMM, adequate dentition Neck: supple, no JVD CV: NRRR, no murmurs evident Pulm: CTA bilaterally, no wheeze or retractions Abd: soft, obese, NTND, +BS MSK: LT knee wound c/d/i with compressive ACE bandage overlying Extrems: trace LT LE edema, 2+ pulses Skin: no rashes, adequate skin turgor Neuro: CN II-XII grossly intact, no focal neurologic deficits appreciated, gait was not assessed, A&Ox 3   Review of Systems:  Review of Systems  Constitutional: Negative for chills, fever and weight loss.  HENT: Negative for congestion, hearing loss, sinus pain and sore throat.   Eyes: Negative for blurred vision, photophobia and discharge.  Respiratory: Negative for cough, hemoptysis and shortness of breath.   Cardiovascular: Negative for chest pain, palpitations, orthopnea and leg swelling.  Gastrointestinal: Positive for constipation. Negative for abdominal pain, diarrhea, heartburn, nausea and vomiting.  Genitourinary: Negative for dysuria, flank pain, frequency and urgency.  Musculoskeletal: Positive for joint pain. Negative for back pain and myalgias.       LT knee  Skin: Negative for itching and rash.  Neurological: Negative for tremors, seizures, weakness and headaches.  Endo/Heme/Allergies: Negative for polydipsia. Does not bruise/bleed easily.  Psychiatric/Behavioral: Negative for depression and substance abuse. The patient is not nervous/anxious and does not have insomnia.      Lab Results  Component Value Date   WBC 14.5 (H) 09/19/2018   HGB 10.7 (L) 09/19/2018   HCT 33.8 (L) 09/19/2018   MCV 81.3 09/19/2018   PLT 172 09/19/2018    Lab Results  Component Value Date   CREATININE 0.74 09/19/2018   BUN 13  09/19/2018   NA 136 09/19/2018   K 4.7 09/19/2018   CL 99 09/19/2018   CO2 30 09/19/2018    Lab Results  Component Value Date   ALT 28 09/20/2017   AST 27 09/20/2017   ALKPHOS 70 09/20/2017     Microbiology: Recent Results (from the past 240 hour(s))  Surgical pcr screen     Status: None   Collection Time: 09/13/18  8:56 AM   Specimen: Vein; Nasal Swab  Result Value Ref Range Status   MRSA, PCR NEGATIVE NEGATIVE Final   Staphylococcus aureus NEGATIVE NEGATIVE Final    Comment: (NOTE) The Xpert SA Assay (FDA approved for NASAL specimens in patients 5 years of age and older), is one component of a comprehensive surveillance program. It is not intended to diagnose infection nor to guide or monitor treatment. Performed at [facility], [place] [address], [place], [place] [address]   SARS Coronavirus 2 (Performed in [facility] hospital lab)     Status: None   Collection Time: 09/13/18  9:42 AM   Specimen: Nasal Swab  Result Value Ref Range Status   SARS Coronavirus 2 NEGATIVE NEGATIVE Final    Comment: (NOTE) SARS-CoV-2 target nucleic acids  are NOT DETECTED. The SARS-CoV-2 RNA is generally detectable in upper and lower respiratory specimens during the acute phase of infection. Negative results do not preclude SARS-CoV-2 infection, do not rule out co-infections with other pathogens, and should not be used as the sole basis for treatment or other patient management decisions. Negative results must be combined with clinical observations, patient history, and epidemiological information. The expected result is Negative. Fact Sheet for Patients: HairSlick.nohttps://www.fda.gov/media/138098/download Fact Sheet for Healthcare Providers: quierodirigir.comhttps://www.fda.gov/media/138095/download This test is not yet approved or cleared by the [place] FDA and  has been authorized for detection and/or diagnosis of SARS-CoV-2 by FDA under an Emergency Use Authorization (EUA). This EUA  will remain  in effect (meaning this test can be used) for the duration of the COVID-19 declaration under Section 56 4(b)(1) of the Act, 21 U.S.C. section 360bbb-3(b)(1), unless the authorization is terminated or revoked sooner. Performed at [facility], [address] [place], [place] [address]   Anaerobic culture     Status: None (Preliminary result)   Collection Time: 09/17/18  1:07 PM   Specimen: Synovium  Result Value Ref Range Status   Specimen Description   Final    SYNOVIAL LT KNEE Performed at [facility], [address] [place], [place] [address]    Special Requests   Final    NONE Performed at [facility], [address] [place], [place] [address]    Culture   Final    NO ANAEROBES ISOLATED; CULTURE IN PROGRESS FOR 5 DAYS   Report Status PENDING  Incomplete  Body fluid culture     Status: None (Preliminary result)   Collection Time: 09/17/18  1:07 PM   Specimen: Synovium  Result Value Ref Range Status   Specimen Description   Final    SYNOVIAL LT KNEE Performed at [facility], [address] [place], [place] [address]    Special Requests   Final    NONE Performed at [facility], [address] [place], [place] [address]    Gram Stain   Final    FEW WBC PRESENT, PREDOMINANTLY PMN NO ORGANISMS SEEN    Culture   Final    FEW STAPHYLOCOCCUS EPIDERMIDIS CRITICAL RESULT CALLED TO, READ BACK BY AND VERIFIED WITH: S. SHARPEN RN, AT [phone] 09/19/18 BY D. Leighton RoachVANHOOK REGARDING CULTURE GROWTH Performed at [facility], 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  1. LT knee PJI - Pt underwent explantation of hardware and placement of an articulating ABX spacer on 09/18/2018. Thus far micro lab is reporting CoNS (sensitivities are still pending) and a second unidentified potential second pathogen. 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 Discussed with CM this afternoon that I cannot finalize his ABX rxs/plan until his cxs are further processed. Tentative ABX d/c date is 10/30/2018. PICC is now intact.  2. Leukocytosis - while this could be reactive from his recent surgery, I  suspect rise is more from post-op ileus at this time. Encourage ambulating, judicious use of narcotics, and increasing bowel regimen to allow for resolution of his ileus. His LT knee wound remains covered but reportedly "looked good" when surgeon last changed dressing.

## 2018-09-19 NOTE — TOC Initial Note (Addendum)
Transition of Care [facility]) - Initial/Assessment Note    Patient Details  Name: Thomas [name] MRN: [id number] Date of Birth: 06/22/1970  Transition of Care [facility]) CM/SW Contact:    Lia Hopping, [place] Phone Number: 09/19/2018, 1:32 PM  Clinical Narrative:     CSW following this patient for Home infusion IV antibiotics therapy and home health  physical therapy arrangements. CSW reached out to [place] pager [phone]) Office [phone] [phone]) to initiate authorization process.  CSW sent patient information requested clinicals (H&P, PICC line report, Operative Note, Med Orders, Labs, Image Report), pending ID notes and culture results to [facility] physician Dr. Sherral Hammers office [phone] and to Mickel Baas flowers with [place] infusion [phone]).  CSW will fax ID progress notes and results to the [phone]) when available.  CSW reached out to [place] with [place] to initiate  [place] physician orders. Ronalee Belts with [facility] provided [place] social worker information to initiate the authorization.   CSW reached out to ID physician Dr. Florene Glen. Patient cultures still pending. He is aware the patient may not be approved by the [place] in time before the weekend.   CSW will continue to follow this patient.   CSW notified the patient at bedside.   Expected Discharge Plan: [place] Barriers to Discharge: No Barriers Identified   Patient Goals and CMS Choice   CMS Medicare.gov Compare Post Acute Care list provided to:: Patient Choice offered to / list presented to : Patient  Expected Discharge Plan and Services Expected Discharge Plan: [place]   Discharge Planning Services: CM Consult Post Acute Care Choice: [place] arrangements for the past 2 months: Single Family Home Expected Discharge Date: 09/19/18                                   Prior Living Arrangements/Services Living arrangements for the past 2  months: Single Family Home Lives with:: Spouse Patient language and need for interpreter reviewed:: Yes Do you feel safe going back to the place where you live?: No      Need for Family Participation in Patient Care: Yes (Comment) Care giver support system in place?: Yes (comment)   Criminal Activity/Legal Involvement Pertinent to Current Situation/Hospitalization: No - Comment as needed  Activities of Daily Living Home Assistive Devices/Equipment: Cane (specify quad or straight), Environmental consultant (specify type), Transport planner, Crutches, Wellsite geologist, Shower chair with back ADL Screening (condition at time of admission) Patient's cognitive ability adequate to safely complete daily activities?: Yes Is the patient deaf or have difficulty hearing?: No Does the patient have difficulty seeing, even when wearing glasses/contacts?: No Does the patient have difficulty concentrating, remembering, or making decisions?: No Patient able to express need for assistance with ADLs?: Yes Does the patient have difficulty dressing or bathing?: No Independently performs ADLs?: Yes (appropriate for developmental age) Does the patient have difficulty walking or climbing stairs?: Yes Weakness of Legs: Left Weakness of Arms/Hands: None  Permission Sought/Granted Permission sought to share information with : Case Manager Permission granted to share information with : Yes, Verbal Permission Granted     Permission granted to share info w AGENCY: [facility]        Emotional Assessment Appearance:: Appears stated age   Affect (typically observed): Accepting Orientation: : Oriented to Self, Oriented to Place, Oriented to  Time, Oriented to Situation Alcohol / Substance Use: Not Applicable Psych  Involvement: No (comment)  Admission diagnosis:  Infected left total knee Patient Active Problem List   Diagnosis Date Noted  . Left TK resection with abx spacer 09/17/2018  . 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 Left knee pain 07/16/2017  . S/P revision of total knee, left 07/16/2017  . Tibial plateau fracture, left 06/12/2017   PCP:  Sondra ComeWoods, Samuel, MD Pharmacy:   [facility] [phone] - [address], [place] - [address] AT [facility] [address] [place] [id number] Phone: [phone] Fax: [phone]     Social Determinants of Health (SDOH) Interventions    Readmission Risk Interventions No flowsheet data found.

## 2018-09-19 NOTE — Progress Notes (Signed)
Writer arrived @ bedside for Vanc peak lab draw per consult. Initially unable to flush PICC. Dressing and cap changed, patient arm repositioned and writer able to draw blood without difficulty.

## 2018-09-19 NOTE — Progress Notes (Signed)
Lab called with results of the culture of synovial fluid from the knee. Culture is positive. Griffith Citron, PA was notified.

## 2018-09-19 NOTE — Progress Notes (Signed)
Patient ID: Thomas [name], male   DOB: 1970/12/17, 48 y.o.   MRN: [id number] Subjective: 2 Days Post-Op Procedure(s) (LRB): Resection of left total knee arthroplasty and placement of antibiotic spacer (Left)    Patient reports pain as moderate.  Trying to get used to this pain after the resection. No events ID consulted yesterday and saw patient  Objective:   VITALS:   Vitals:   09/19/18 0352 09/19/18 0837  BP: (!) 144/92 127/79  Pulse: 62 79  Resp: 14   Temp: (!) 97.5 F (36.4 C)   SpO2: 96%     Neurovascular intact Incision: dressing C/D/I  LABS Recent Labs    09/18/18 0258 09/19/18 0334  HGB 11.7* 10.7*  HCT 36.4* 33.8*  WBC 7.7 14.5*  PLT 105* 172    Recent Labs    09/18/18 0258 09/19/18 0334  NA 135 136  K 4.0 4.7  BUN 7 13  CREATININE 0.67 0.74  GLUCOSE 243* 141*    No results for input(s): LABPT, INR in the last 72 hours.   Assessment/Plan: 2 Days Post-Op Procedure(s) (LRB): Resection of left total knee arthroplasty and placement of antibiotic spacer (Left)   Up with therapy Continue ABX therapy due to infected left total knee Awaiting cultures to determine optimal antibiotic to treat infection, ID on board  PWB LLE Discharge with [facility] RN for IV antibiotics, HHPT for mobility and ROM related to left knee.  D/D likely tomorrow versus Saturday

## 2018-09-20 LAB — CREATININE, SERUM
Creatinine, Ser: 0.75 mg/dL (ref 0.61–1.24)
GFR calc Af Amer: >60 mL/min (ref >60)
GFR calc non Af Amer: >60 mL/min (ref >60)

## 2018-09-20 LAB — BODY FLUID CULTURE

## 2018-09-20 LAB — VANCOMYCIN, TROUGH: Vancomycin Tr: 11 ug/mL — ABNORMAL LOW (ref 15–20)

## 2018-09-20 MED ORDER — SODIUM CHLORIDE 0.9 % IV SOLN
3.0000 g | Freq: Four times a day (QID) | INTRAVENOUS | Status: DC
  Filled 2018-09-20: qty 8

## 2018-09-20 MED ORDER — ACETAMINOPHEN 500 MG PO TABS: 1000.0000 mg | ORAL_TABLET | Freq: Three times a day (TID) | ORAL | 0 refills | Status: AC

## 2018-09-20 MED ORDER — ASPIRIN 81 MG PO CHEW: 81.0000 mg | CHEWABLE_TABLET | Freq: Two times a day (BID) | ORAL | 0 refills | Status: AC

## 2018-09-20 MED ORDER — OXYCODONE HCL 10 MG PO TABS: 10.0000 mg | ORAL_TABLET | ORAL | 0 refills | Status: AC | PRN

## 2018-09-20 MED ORDER — DOCUSATE SODIUM 100 MG PO CAPS: 100.0000 mg | ORAL_CAPSULE | Freq: Two times a day (BID) | ORAL | 0 refills | Status: AC

## 2018-09-20 MED ORDER — FERROUS SULFATE 325 (65 FE) MG PO TABS: 325.0000 mg | ORAL_TABLET | Freq: Three times a day (TID) | ORAL | 0 refills | Status: DC

## 2018-09-20 MED ORDER — VANCOMYCIN IV (FOR PTA / DISCHARGE USE ONLY): 1500.0000 mg | Freq: Two times a day (BID) | INTRAVENOUS | 0 refills | Status: AC

## 2018-09-20 MED ORDER — METHOCARBAMOL 500 MG PO TABS: 500.0000 mg | ORAL_TABLET | Freq: Four times a day (QID) | ORAL | 1 refills | Status: DC | PRN

## 2018-09-20 MED ORDER — POLYETHYLENE GLYCOL 3350 17 G PO PACK: 17.0000 g | PACK | Freq: Two times a day (BID) | ORAL | 0 refills | Status: AC

## 2018-09-20 NOTE — Progress Notes (Signed)
PHARMACY CONSULT NOTE FOR:  OUTPATIENT  PARENTERAL ANTIBIOTIC THERAPY (OPAT)  Indication: MRSE prosthetic joint infection of knee Regimen: Vancomycin 1500mg  IV q12h End date: 10/31/2018  IV antibiotic discharge orders are pended. To discharging provider:  please sign these orders via discharge navigator,  Select New Orders & click on the button choice - Manage This Unsigned Work.     Thank you for allowing pharmacy to be a part of this patient's care.  Doreene Eland, PharmD, BCPS.   Work Cell: [phone] 09/20/2018 8:54 AM

## 2018-09-20 NOTE — Progress Notes (Signed)
Pharmacy Antibiotic Note  Thomas [name] is a 48 y.o. 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  Pharmacy has been consulted for prosthetic joint infection dosing.  Plan: Continue with Vancomycin 1500mg  iv q12hr AUC is within goal range--see screenshot below  Height: 5\' 10"  (177.8 cm) Weight: 288 lb 12.8 oz (131 kg) IBW/kg (Calculated) : 73  Temp (24hrs), Avg:97.6 F (36.4 C), Min:97.5 F (36.4 C), Max:97.8 F (36.6 C)  Recent Labs  Lab 09/13/18 0856 09/18/18 0258 09/19/18 0334 09/19/18 2238 09/20/18 0542  WBC 8.2 7.7 14.5*  --   --   CREATININE 0.80 0.67 0.74  --  0.75  VANCOTROUGH  --   --   --   --  11*  VANCOPEAK  --   --   --  21*  --     Estimated Creatinine Clearance: 150.3 mL/min (by C-G formula based on SCr of 0.75 mg/dL).    No Known Allergies  Antimicrobials this admission:  vancomycin 7/15 >>  Ancef 7/14 x 1 preop Amp/sulbactam 7/15>> Dose adjustments this admission:  7/16 2238 vanco peak =  21 (on vanco 1500mg  IV q12h, level with 4th dose, dose 7/16 1724) 7/17 0542 vanco trough = 11    Microbiology results: 7/14 Synovial fluid anaerobic: ngtd 7/14 Synovial fluid L knee: few staphylococcus epidermis 7/10 MRSA PCR neg 7/10 MSSA PCR neg 7/10 Covid neg  Thank you for allowing pharmacy to be a part of this patient's care.  Nani Skillern [place] 09/20/2018 6:34 AM

## 2018-09-20 NOTE — TOC Progression Note (Addendum)
Transition of Care [place]) - Progression Note    Patient Details  Name: Philo Kurtz MRN: [id number] Date of Birth: December 08, 1970  Transition of Care [facility]) CM/SW [place], [place] Phone Number: 09/20/2018, 12:51 PM  Clinical Narrative:    -Clinical (ID progress note, orders, Vancomycin Trough)  faxed to [facility] ID clinic, [facility] and Dr. Lanier Clam office.  CSW reached out to [facility] flowers. Now awaiting for patient information to be processed through [place] ID clinic.   Patient approved by the [facility] ID clinic. [facility]  RN will only be available starting Sunday. [facility] will assist with IV antibiotics medications.  Patient can discharge Sunday Afternoon.CSW notified patient.     Expected Discharge Plan: [place] Barriers to Discharge: No Barriers Identified  Expected Discharge Plan and Services Expected Discharge Plan: [place]   Discharge Planning Services: CM Consult Post Acute Care Choice: [place] arrangements for the past 2 months: Single Family Home Expected Discharge Date: 09/20/18                                     Social Determinants of Health (SDOH) Interventions    Readmission Risk Interventions No flowsheet data found.

## 2018-09-20 NOTE — Progress Notes (Addendum)
[place] for Infectious Disease   Reason for visit: Follow up on LT knee prosthetic joint infection  Antibiotics: Vancomycin, day 3 Unasyn, day 3  Interval History: 2 BMs this AM. Appetite is good. Ambulated with walker in hallway with PT assistance past nurses' station and back. PICC line placed yesterday. Fever curve, WBC trends, cx results, and op notes all independently reviewed    Current Facility-Administered Medications:  .  0.9 %  sodium chloride infusion, , Intravenous, Continuous, Babish, Rodman Key, PA-C, Last Rate: 20 mL/hr at 09/20/18 1000 .  alum & mag hydroxide-simeth (MAALOX/MYLANTA) 200-200-20 MG/5ML suspension 15 mL, 15 mL, Oral, Q4H PRN, Babish, Matthew, PA-C .  aspirin chewable tablet 81 mg, 81 mg, Oral, BID, Danae Orleans, PA-C, 81 mg at 09/20/18 2446 .  bisacodyl (DULCOLAX) suppository 10 mg, 10 mg, Rectal, Daily PRN, Babish, Matthew, PA-C .  celecoxib (CELEBREX) capsule 200 mg, 200 mg, Oral, BID, Babish, Matthew, PA-C, 200 mg at 09/20/18 2863 .  diphenhydrAMINE (BENADRYL) 12.5 MG/5ML elixir 12.5-25 mg, 12.5-25 mg, Oral, Q4H PRN, Babish, Matthew, PA-C .  docusate sodium (COLACE) capsule 100 mg, 100 mg, Oral, BID, Danae Orleans, PA-C, 100 mg at 09/20/18 8177 .  escitalopram (LEXAPRO) tablet 20 mg, 20 mg, Oral, Daily, Danae Orleans, PA-C, 20 mg at 09/20/18 1165 .  ferrous sulfate tablet 325 mg, 325 mg, Oral, BID WC, Danae Orleans, PA-C, 325 mg at 09/20/18 7903 .  HYDROmorphone (DILAUDID) injection 0.5-1 mg, 0.5-1 mg, Intravenous, Q2H PRN, Babish, Matthew, PA-C, 1 mg at 09/18/18 0241 .  HYDROmorphone (DILAUDID) injection 0.5-1 mg, 0.5-1 mg, Intravenous, Q4H PRN, Babish, Matthew, PA-C .  hydrOXYzine (ATARAX/VISTARIL) tablet 50 mg, 50 mg, Oral, QHS PRN, Babish, Matthew, PA-C .  magnesium citrate solution 1 Bottle, 1 Bottle, Oral, Once PRN, Babish, Matthew, PA-C .  menthol-cetylpyridinium (CEPACOL) lozenge 3 mg, 1 lozenge, Oral, PRN **OR** phenol  (CHLORASEPTIC) mouth spray 1 spray, 1 spray, Mouth/Throat, PRN, Babish, Matthew, PA-C .  methocarbamol (ROBAXIN) tablet 500 mg, 500 mg, Oral, Q6H PRN, 500 mg at 09/20/18 0832 **OR** methocarbamol (ROBAXIN) 500 mg in dextrose 5 % 50 mL IVPB, 500 mg, Intravenous, Q6H PRN, Danae Orleans, PA-C, Stopped at 09/17/18 1650 .  metoCLOPramide (REGLAN) tablet 5-10 mg, 5-10 mg, Oral, Q8H PRN **OR** metoCLOPramide (REGLAN) injection 5-10 mg, 5-10 mg, Intravenous, Q8H PRN, Babish, Matthew, PA-C .  metoprolol tartrate (LOPRESSOR) tablet 100 mg, 100 mg, Oral, BID, Danae Orleans, PA-C, 100 mg at 09/20/18 8333 .  omeprazole (PRILOSEC) capsule 20 mg, 20 mg, Oral, QAC breakfast, Danae Orleans, PA-C, 20 mg at 09/20/18 8329 .  ondansetron (ZOFRAN) tablet 4 mg, 4 mg, Oral, Q6H PRN **OR** ondansetron (ZOFRAN) injection 4 mg, 4 mg, Intravenous, Q6H PRN, Babish, Matthew, PA-C .  oxyCODONE (Oxy IR/ROXICODONE) immediate release tablet 10 mg, 10 mg, Oral, Q4H PRN, Babish, Matthew, PA-C .  oxyCODONE (Oxy IR/ROXICODONE) immediate release tablet 20 mg, 20 mg, Oral, Q4H PRN, Babish, Matthew, PA-C, 20 mg at 09/20/18 1226 .  polyethylene glycol (MIRALAX / GLYCOLAX) packet 17 g, 17 g, Oral, BID, Danae Orleans, PA-C, 17 g at 09/19/18 2153 .  pregabalin (LYRICA) capsule 300 mg, 300 mg, Oral, BID, Danae Orleans, PA-C, 300 mg at 09/20/18 1916 .  sodium chloride flush (NS) 0.9 % injection 10-40 mL, 10-40 mL, Intracatheter, PRN, Paralee Cancel, MD .  traZODone (DESYREL) tablet 200 mg, 200 mg, Oral, QHS, Babish, Matthew, PA-C, 200 mg at 09/19/18 2153 .  vancomycin (VANCOCIN) 1,500 mg in sodium chloride 0.9 % 500 mL  IVPB, 1,500 mg, Intravenous, Q12H, Lenis Noon, [place], Stopped at 09/20/18 6269 .  verapamil (CALAN-SR) CR tablet 120 mg, 120 mg, Oral, Daily, Danae Orleans, PA-C, 120 mg at 09/20/18 4854     Vitals:   09/19/18 2156 09/20/18 0434  BP: (!) 139/101 (!) 132/96  Pulse: 69 69  Resp: 16 16  Temp: 97.8 F (36.6 C) (!)  97.5 F (36.4 C)  SpO2: 99% 98%   Physical Exam Lines: RT arm PICC Gen: pleasant, obese, NAD, A&Ox 3 Head: NCAT, no temporal wasting evident EENT: PERRL, EOMI, MMM, adequate dentition Neck: supple, no JVD CV: NRRR, no murmurs evident Pulm: CTA bilaterally, no wheeze or retractions Abd: soft, obese, NTND, +BS MSK: LT knee wound c/d/i with compressive ACE bandage overlying Extrems: trace LT LE edema, 2+ pulses Skin: no rashes, adequate skin turgor Neuro: CN II-XII grossly intact, no focal neurologic deficits appreciated, gait was not assessed, A&Ox 3   Review of Systems:  Review of Systems  Constitutional: Negative for chills, fever and weight loss.  HENT: Negative for congestion, hearing loss, sinus pain and sore throat.   Eyes: Negative for blurred vision, photophobia and discharge.  Respiratory: Negative for cough, hemoptysis and shortness of breath.   Cardiovascular: Negative for chest pain, palpitations, orthopnea and leg swelling.  Gastrointestinal: Negative for abdominal pain, constipation, diarrhea, heartburn, nausea and vomiting.  Genitourinary: Negative for dysuria, flank pain, frequency and urgency.  Musculoskeletal: Positive for joint pain. Negative for back pain and myalgias.       LT knee  Skin: Negative for itching and rash.  Neurological: Negative for tremors, seizures, weakness and headaches.  Endo/Heme/Allergies: Negative for polydipsia. Does not bruise/bleed easily.  Psychiatric/Behavioral: Negative for depression and substance abuse. The patient is not nervous/anxious and does not have insomnia.      Lab Results  Component Value Date   WBC 14.5 (H) 09/19/2018   HGB 10.7 (L) 09/19/2018   HCT 33.8 (L) 09/19/2018   MCV 81.3 09/19/2018   PLT 172 09/19/2018    Lab Results  Component Value Date   CREATININE 0.75 09/20/2018   BUN 13 09/19/2018   NA 136 09/19/2018   K 4.7 09/19/2018   CL 99 09/19/2018   CO2 30 09/19/2018    Lab Results  Component Value  Date   ALT 28 09/20/2017   AST 27 09/20/2017   ALKPHOS 70 09/20/2017     Microbiology: Recent Results (from the past 240 hour(s))  Surgical pcr screen     Status: None   Collection Time: 09/13/18  8:56 AM   Specimen: Vein; Nasal Swab  Result Value Ref Range Status   MRSA, PCR NEGATIVE NEGATIVE Final   Staphylococcus aureus NEGATIVE NEGATIVE Final    Comment: (NOTE) The Xpert SA Assay (FDA approved for NASAL specimens in patients 69 years of age and older), is one component of a comprehensive surveillance program. It is not intended to diagnose infection nor to guide or monitor treatment. Performed at [facility], [place] [address] [place], [place] [address]   SARS Coronavirus 2 (Performed in [facility] hospital lab)     Status: None   Collection Time: 09/13/18  9:42 AM   Specimen: Nasal Swab  Result Value Ref Range Status   SARS Coronavirus 2 NEGATIVE NEGATIVE Final    Comment: (NOTE) SARS-CoV-2 target nucleic acids are NOT DETECTED. The SARS-CoV-2 RNA is generally detectable in upper and lower respiratory specimens during the acute phase of infection. Negative results do not  preclude SARS-CoV-2 infection, do not rule out co-infections with other pathogens, and should not be used as the sole basis for treatment or other patient management decisions. Negative results must be combined with clinical observations, patient history, and epidemiological information. The expected result is Negative. Fact Sheet for Patients: SugarRoll.be Fact Sheet for Healthcare Providers: https://www.woods-mathews.com/ This test is not yet approved or cleared by the [place] FDA and  has been authorized for detection and/or diagnosis of SARS-CoV-2 by FDA under an Emergency Use Authorization (EUA). This EUA will remain  in effect (meaning this test can be used) for the duration of the COVID-19 declaration under Section 56 4(b)(1) of  the Act, 21 U.S.C. section 360bbb-3(b)(1), unless the authorization is terminated or revoked sooner. Performed at [facility], [place] [address] [place], [place] [address]   Anaerobic culture     Status: None (Preliminary result)   Collection Time: 09/17/18  1:07 PM   Specimen: Synovium  Result Value Ref Range Status   Specimen Description   Final    SYNOVIAL LT KNEE Performed at [place] [address] [place], [place] [address]    Special Requests   Final    NONE Performed at [facility], [place] [address] [place], [place] [address]    Culture   Final    NO ANAEROBES ISOLATED; CULTURE IN PROGRESS FOR 5 DAYS   Report Status PENDING  Incomplete  Body fluid culture     Status: None   Collection Time: 09/17/18  1:07 PM   Specimen: Synovium  Result Value Ref Range Status   Specimen Description   Final    SYNOVIAL LT KNEE Performed at [place] [address] [place],  [place] [address]    Special Requests   Final    NONE Performed at [facility], [place] [address], [place], [place] [address]    Gram Stain   Final    FEW WBC PRESENT, PREDOMINANTLY PMN NO ORGANISMS SEEN    Culture   Final    FEW STAPHYLOCOCCUS EPIDERMIDIS CRITICAL RESULT CALLED TO, READ BACK BY AND VERIFIED WITH: S. SHARPEN RN, AT [phone] 09/19/18 BY D. Victoriano Lain REGARDING CULTURE GROWTH Performed at [facility], [place] [address] [place], [place] [address]    Report Status 09/20/2018 FINAL  Final   Organism ID, Bacteria STAPHYLOCOCCUS EPIDERMIDIS  Final      Susceptibility   Staphylococcus epidermidis - MIC*    CIPROFLOXACIN <=0.5 SENSITIVE Sensitive     ERYTHROMYCIN >=8 RESISTANT Resistant     GENTAMICIN <=0.5 SENSITIVE Sensitive     OXACILLIN >=4 RESISTANT Resistant     TETRACYCLINE >=16 RESISTANT Resistant     VANCOMYCIN 1 SENSITIVE Sensitive     TRIMETH/SULFA >=320 RESISTANT Resistant     CLINDAMYCIN <=0.25  SENSITIVE Sensitive     RIFAMPIN <=0.5 SENSITIVE Sensitive     Inducible Clindamycin NEGATIVE Sensitive     * FEW STAPHYLOCOCCUS EPIDERMIDIS   OPAT ORDERS:  Diagnosis: LT knee prosthetic joint infection  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  No Known Allergies  Discharge antibiotics: Vancomycin per pharmacy protocol (1500 mg IV q 12 hrs) Aim for Vancomycin trough 15-20 (unless otherwise indicated) Duration: 6 weeks End Date: 10/31/2018  [facility] Care and Maintenance Per Protocol __ Please pull PIC at completion of IV antibiotics _X_ Please leave PIC in place until doctor has seen patient or been notified  Labs weekly while on IV antibiotics: _X_ CBC  with differential _X_ BMP __ BMP TWICE WEEKLY** __ CMP _X_ CRP __ ESR _X_ Vancomycin trough  Fax weekly labs to [phone]  Clinic Follow Up Appt: Thursday 10/09/18 at 10:00 AM   @ [facility] with Dr. 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  1. LT knee PJI - Pt underwent explantation of hardware and placement of an articulating ABX spacer on 09/18/2018. 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 Baseline post-operative CRP was 2.3 on 09/19/2018. OPAT orders are noted above. Tentative ABX d/c date is 10/31/2018. PICC is now intact. > 20 minutes of time spent on d/c planning alone today.  2. Leukocytosis - while this could be reactive from his recent surgery, I suspect rise is more from recent post-op  ileus at this time. Encourage ambulating, judicious use of narcotics, and increasing bowel regimen to allow for resolution of his ileus. His ileus has now resolved but CBC was not repeated today following these bowel movements. His LT knee wound remains covered but reportedly "looked good" when surgeon last changed dressing.

## 2018-09-20 NOTE — Progress Notes (Signed)
Physical Therapy Treatment Patient Details Name: Thomas [name] MRN: [id number] DOB: [birth year] Today's Date: 09/20/2018    History of Present Illness Pt is a 48 y.o. male s/p L TKA resection with placement of ABX spacer. PMH of left knee UKA performed on 02/10/2017 by Dr. Durene RomansMatthew Olin, which was complicated by periprosthetic fracture requiring ORIF and instability with polyethylene dislocation due to the varus collapse. 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 He began having symptoms of left knee pain, swelling, and stiffness, now s/p resection arthroplasy with placement of antibiotic spacer.    PT Comments    Pt continues to progress well with mobility and is eager for return home.  Pt performed home therex program and written instruction provided.   Follow Up Recommendations  No PT follow up     Equipment Recommendations  None recommended by PT    Recommendations for Other Services       Precautions / Restrictions Precautions Precautions: Fall;Knee Restrictions Weight Bearing Restrictions: Yes LLE Weight Bearing: Partial weight bearing LLE Partial Weight Bearing Percentage or Pounds: 50%    Mobility  Bed Mobility Overal bed mobility: Needs Assistance Bed Mobility: Supine to Sit     Supine to sit: Supervision        Transfers Overall transfer level: Needs assistance Equipment used: Rolling walker (2 wheeled) Transfers: Sit to/from Stand Sit to Stand: Supervision         General transfer comment: cues for use of UEs   Ambulation/Gait Ambulation/Gait assistance: Supervision;Min guard Gait Distance (Feet): 150 Feet Assistive device: Rolling walker (2 wheeled) Gait Pattern/deviations: Step-to pattern;Decreased step length - right;Decreased stance time - left Gait velocity: decr   General Gait Details: min cues for posture, position from RW and PWB   Stairs Stairs: (Pt states he feels comfortable with ability to negotiate  sta)           Wheelchair Mobility    Modified Rankin (Stroke Patients Only)       Balance                                            Cognition Arousal/Alertness: Awake/alert Behavior During Therapy: WFL for tasks assessed/performed Overall Cognitive Status: Within Functional Limits for tasks assessed                                        Exercises Total Joint Exercises Ankle Circles/Pumps: AROM;Both;20 reps;Supine Quad Sets: AROM;Strengthening;Left;10 reps;Supine Heel Slides: AROM;Strengthening;Left;Supine;20 reps Hip ABduction/ADduction: AROM;Strengthening;Left;Supine;20 reps Straight Leg Raises: AROM;Strengthening;Left;Supine;20 reps Goniometric ROM: 40 flex AAROM L knee    General Comments        Pertinent Vitals/Pain Pain Assessment: 0-10 Pain Score: 6  Pain Location: L knee Pain Descriptors / Indicators: Sore Pain Intervention(s): Limited activity within patient's tolerance;Monitored during session;Premedicated before session;Ice applied    Home Living                      Prior Function            PT Goals (current goals can now be found in the care plan section) Acute Rehab PT Goals Patient Stated Goal: to go home, heal knee PT Goal Formulation: With patient Time For Goal Achievement: 09/21/18 Potential to Achieve Goals: Good Progress  towards PT goals: Progressing toward goals    Frequency    Min 6X/week      PT Plan Current plan remains appropriate    Co-evaluation              AM-PAC PT "6 Clicks" Mobility   Outcome Measure  Help needed turning from your back to your side while in a flat bed without using bedrails?: None Help needed moving from lying on your back to sitting on the side of a flat bed without using bedrails?: A Little Help needed moving to and from a bed to a chair (including a wheelchair)?: A Little Help needed standing up from a chair using your arms (e.g.,  wheelchair or bedside chair)?: A Little Help needed to walk in hospital room?: A Little Help needed climbing 3-5 steps with a railing? : A Little 6 Click Score: 19    End of Session Equipment Utilized During Treatment: Gait belt Activity Tolerance: Patient tolerated treatment well Patient left: in chair;with call bell/phone within reach Nurse Communication: Mobility status PT Visit Diagnosis: Other abnormalities of gait and mobility (R26.89)     Time: 5170-0174 PT Time Calculation (min) (ACUTE ONLY): 33 min  Charges:  $Gait Training: 8-22 mins $Therapeutic Exercise: 8-22 mins                     [place] Pager [phone] Office [phone]    Charee Tumblin 09/20/2018, 12:30 PM

## 2018-09-20 NOTE — Progress Notes (Signed)
     Subjective: 3 Days Post-Op Procedure(s) (LRB): Resection of left total knee arthroplasty and placement of antibiotic spacer (Left)   Patient reports pain as mild, pain controlled. No events throughout the night.  Dr. Alvan Dame discussed the PICC line and antibiotic use.  Discussed expectation moving forward and plan for future surgery to reimplant the prosthesis.  Patient would like to get home today, but we will wait on final recs from ID and set up of antibiotics through the [place].   If final rec placed, antibiotic(s) arranged and home health setup he can be discharged home later today.    Objective:   VITALS:   Vitals:   09/19/18 2156 09/20/18 0434  BP: (!) 139/101 (!) 132/96  Pulse: 69 69  Resp: 16 16  Temp: 97.8 F (36.6 C) (!) 97.5 F (36.4 C)  SpO2: 99% 98%    Dorsiflexion/Plantar flexion intact Incision: dressing C/D/I No cellulitis present Compartment soft  LABS Recent Labs    09/18/18 0258 09/19/18 0334  HGB 11.7* 10.7*  HCT 36.4* 33.8*  WBC 7.7 14.5*  PLT 105* 172    Recent Labs    09/18/18 0258 09/19/18 0334 09/20/18 0542  NA 135 136  --   K 4.0 4.7  --   BUN 7 13  --   CREATININE 0.67 0.74 0.75  GLUCOSE 243* 141*  --      Assessment/Plan: 3 Days Post-Op Procedure(s) (LRB): Resection of left total knee arthroplasty and placement of antibiotic spacer (Left) Awaiting final rec from ID, home health setup and that the meds can be obtained through the [place] Up with therapy Discharge home with home health if everything is arranged If not arranged and/or completed then he will probably be here through the weekend as no arrangements would be able to be made during the weekend and inappropriate for him to be d/c'ed home without his antibiotics Follow up in 2 weeks at [facility] Follow up with OLIN,Dilan Novosad D in 2 weeks.  Contact information:  [facility]) [address] [place]. Verlon Pischke   PAC  09/20/2018, 7:52 AM

## 2018-09-21 LAB — CREATININE, SERUM
Creatinine, Ser: 0.71 mg/dL (ref 0.61–1.24)
GFR calc Af Amer: >60 mL/min (ref >60)
GFR calc non Af Amer: >60 mL/min (ref >60)

## 2018-09-21 NOTE — Progress Notes (Signed)
Physical Therapy Treatment Patient Details Name: Thomas [name] MRN: [id number] DOB: [birth year] Today's Date: 09/21/2018    History of Present Illness Pt is a 48 y.o. male s/p L TKA resection with placement of ABX spacer. PMH of left knee UKA performed on 02/10/2017 by Dr. Durene RomansMatthew Olin, which was complicated by periprosthetic fracture requiring ORIF and instability with polyethylene dislocation due to the varus collapse. 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 He began having symptoms of left knee pain, swelling, and stiffness, now s/p resection arthroplasy with placement of antibiotic spacer.    PT Comments    Pt continues to progress with mobility - occasional cueing for position from RW to insure PWB compliance.   Follow Up Recommendations  No PT follow up     Equipment Recommendations  None recommended by PT    Recommendations for Other Services       Precautions / Restrictions Precautions Precautions: Fall;Knee Restrictions LLE Weight Bearing: Partial weight bearing LLE Partial Weight Bearing Percentage or Pounds: 50%    Mobility  Bed Mobility Overal bed mobility: Needs Assistance Bed Mobility: Supine to Sit;Sit to Supine     Supine to sit: Supervision Sit to supine: Supervision      Transfers Overall transfer level: Needs assistance Equipment used: Rolling walker (2 wheeled) Transfers: Sit to/from Stand Sit to Stand: Supervision         General transfer comment: cues for use of UEs and for LE management  Ambulation/Gait Ambulation/Gait assistance: Supervision;Min guard Gait Distance (Feet): 250 Feet Assistive device: Rolling walker (2 wheeled) Gait Pattern/deviations: Step-to pattern;Decreased step length - right;Decreased stance time - left Gait velocity: decr   General Gait Details: min cues for posture, position from RW and PWB   Stairs             Wheelchair Mobility    Modified Rankin (Stroke Patients  Only)       Balance Overall balance assessment: Mild deficits observed, not formally tested                                          Cognition Arousal/Alertness: Awake/alert Behavior During Therapy: WFL for tasks assessed/performed Overall Cognitive Status: Within Functional Limits for tasks assessed                                        Exercises      General Comments        Pertinent Vitals/Pain Pain Assessment: 0-10 Pain Score: 6  Pain Location: L knee Pain Descriptors / Indicators: Sore Pain Intervention(s): Limited activity within patient's tolerance;Monitored during session;Premedicated before session    Home Living                      Prior Function            PT Goals (current goals can now be found in the care plan section) Acute Rehab PT Goals Patient Stated Goal: to go home, heal knee PT Goal Formulation: With patient Time For Goal Achievement: 09/21/18 Potential to Achieve Goals: Good Progress towards PT goals: Progressing toward goals    Frequency    Min 6X/week      PT Plan Current plan remains appropriate    Co-evaluation  AM-PAC PT "6 Clicks" Mobility   Outcome Measure  Help needed turning from your back to your side while in a flat bed without using bedrails?: None Help needed moving from lying on your back to sitting on the side of a flat bed without using bedrails?: None Help needed moving to and from a bed to a chair (including a wheelchair)?: A Little Help needed standing up from a chair using your arms (e.g., wheelchair or bedside chair)?: A Little Help needed to walk in hospital room?: A Little Help needed climbing 3-5 steps with a railing? : A Little 6 Click Score: 20    End of Session Equipment Utilized During Treatment: Gait belt Activity Tolerance: Patient tolerated treatment well Patient left: in chair;with call bell/phone within reach Nurse Communication:  Mobility status PT Visit Diagnosis: Other abnormalities of gait and mobility (R26.89)     Time: 3567-0141 PT Time Calculation (min) (ACUTE ONLY): 22 min  Charges:  $Gait Training: 8-22 mins                     [place] Pager [phone] Office [phone]    Beronica [place] 09/21/2018, 4:48 PM

## 2018-09-21 NOTE — Progress Notes (Signed)
   Subjective: 4 Days Post-Op Procedure(s) (LRB): Resection of left total knee arthroplasty and placement of antibiotic spacer (Left)  Pt c/o mild pain and mild bleeding from operative site Plan for d/c tomorrow after home health is setup by social work Overall pt doing well Denies any new symptoms or issues Patient reports pain as mild.  Objective:   VITALS:   Vitals:   09/20/18 2040 09/21/18 0448  BP: (!) 142/94 (!) 153/87  Pulse: 85 64  Resp: 20 16  Temp: 98.4 F (36.9 C) 97.7 F (36.5 C)  SpO2: 94% 98%    Left knee: mild drainage to dressing nv intact distally Mild to moderate edema bilateral lower legs No rashes seen  LABS Recent Labs    09/19/18 0334  HGB 10.7*  HCT 33.8*  WBC 14.5*  PLT 172    Recent Labs    09/19/18 0334 09/20/18 0542 09/21/18 0345  NA 136  --   --   K 4.7  --   --   BUN 13  --   --   CREATININE 0.74 0.75 0.71  GLUCOSE 141*  --   --      Assessment/Plan: 4 Days Post-Op Procedure(s) (LRB): Resection of left total knee arthroplasty and placement of antibiotic spacer (Left) Continue PT/OT Plan for d/c home tomorrow with home health Continue IV antibiotics Pulmonary toilet    Thomas [name] [place], [place] is now [employer]  [address], [place], [place] [address] Phone: [phone] [email or web address] Facebook  [employer]

## 2018-09-21 NOTE — Progress Notes (Signed)
Physical Therapy Treatment Patient Details Name: Thomas [name] MRN: [id number] DOB: 21-Sep-1970 Today's Date: 09/21/2018    History of Present Illness Pt is a 48 y.o. male s/p L TKA resection with placement of ABX spacer. PMH of left knee UKA performed on 02/10/2017 by Dr. 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 He began having symptoms of left knee pain, swelling, and stiffness, now s/p resection arthroplasy with placement of antibiotic spacer.    PT Comments    Pt continues to progress toward IND with mobility and hopeful for return home tomorrow.     Follow Up Recommendations  No PT follow up     Equipment Recommendations  None recommended by PT    Recommendations for Other Services       Precautions / Restrictions Precautions Precautions: Fall;Knee Restrictions Weight Bearing Restrictions: Yes LLE Weight Bearing: Partial weight bearing LLE Partial Weight Bearing Percentage or Pounds: 50%    Mobility  Bed Mobility Overal bed mobility: Needs Assistance Bed Mobility: Supine to Sit;Sit to Supine     Supine to sit: Supervision Sit to supine: Supervision      Transfers Overall transfer level: Needs assistance Equipment used: Rolling walker (2 wheeled) Transfers: Sit to/from Stand Sit to Stand: Supervision         General transfer comment: cues for use of UEs and for LE management  Ambulation/Gait Ambulation/Gait assistance: Supervision;Min guard Gait Distance (Feet): 200 Feet Assistive device: Rolling walker (2 wheeled) Gait Pattern/deviations: Step-to pattern;Decreased step length - right;Decreased stance time - left Gait velocity: decr       Stairs             Wheelchair Mobility    Modified Rankin (Stroke Patients Only)       Balance                                            Cognition Arousal/Alertness: Awake/alert Behavior During Therapy: WFL for tasks assessed/performed Overall Cognitive Status: Within Functional Limits for tasks assessed                                        Exercises      General Comments        Pertinent Vitals/Pain Pain Assessment: 0-10 Pain Score: 6  Pain Location: L knee Pain Descriptors / Indicators: Sore Pain Intervention(s): Limited activity within patient's tolerance;Monitored during session;Premedicated before session;Ice applied    Home Living                      Prior Function            PT Goals (current goals can now be found in the care plan section) Acute Rehab PT Goals Patient Stated Goal: to go home, heal knee PT Goal Formulation: With patient Time For Goal Achievement: 09/21/18 Potential to Achieve Goals: Good Progress towards PT goals: Progressing toward goals    Frequency    Min 6X/week      PT Plan Current plan remains appropriate    Co-evaluation              AM-PAC PT "6 Clicks" Mobility  Outcome Measure  Help needed turning from your back to your side while in a flat bed without using bedrails?: None Help needed moving from lying on your back to sitting on the side of a flat bed without using bedrails?: None Help needed moving to and from a bed to a chair (including a wheelchair)?: A Little Help needed standing up from a chair using your arms (e.g., wheelchair or bedside chair)?: A Little Help needed to walk in hospital room?: A Little Help needed climbing 3-5 steps with a railing? : A Little 6 Click Score: 20    End of Session Equipment Utilized During Treatment: Gait belt Activity Tolerance: Patient tolerated treatment well Patient left: in bed;with call bell/phone within reach Nurse Communication: Mobility status PT Visit Diagnosis: Other abnormalities of gait and mobility (R26.89)     Time: [id number] PT  Time Calculation (min) (ACUTE ONLY): 22 min  Charges:  $Gait Training: 8-22 mins                     Mauro KaufmannHunter Brehanna Deveny PT Acute Rehabilitation Services Pager [phone] Office [phone]    Andrienne Havener 09/21/2018, 11:23 AM

## 2018-09-22 LAB — ANAEROBIC CULTURE

## 2018-09-22 LAB — CREATININE, SERUM
Creatinine, Ser: 0.62 mg/dL (ref 0.61–1.24)
GFR calc Af Amer: >60 mL/min (ref >60)
GFR calc non Af Amer: >60 mL/min (ref >60)

## 2018-09-22 LAB — HEMOGLOBIN AND HEMATOCRIT, BLOOD
HCT: 32.1 % — ABNORMAL LOW (ref 39.0–52.0)
Hemoglobin: 10.1 g/dL — ABNORMAL LOW (ref 13.0–17.0)

## 2018-09-22 MED ORDER — HEPARIN SOD (PORK) LOCK FLUSH 100 UNIT/ML IV SOLN: 250.0000 [IU] | INTRAVENOUS | Status: DC | PRN

## 2018-09-22 NOTE — Progress Notes (Signed)
Thomas [name]  MRN: [id number] DOB/Age: 09-07-1970 48 y.o. [facility] Procedure: Procedure(s) (LRB): Resection of left total knee arthroplasty and placement of antibiotic spacer (Left)     Subjective: Anxious to go home, working with PT currently.  Vital Signs Temp:  [97.6 F (36.4 C)-98 F (36.7 C)] 97.6 F (36.4 C) (07/19 0457) Pulse Rate:  [72-76] 76 (07/19 0457) Resp:  [16-17] 16 (07/19 0457) BP: (126-152)/(80-94) 126/84 (07/19 0457) SpO2:  [96 %-98 %] 97 % (07/19 0457)  Lab Results No results for input(s): WBC, HGB, HCT, PLT in the last 72 hours. BMET Recent Labs    09/21/18 0345 09/22/18 0615  CREATININE 0.71 0.62   No results found for: INR   Exam Left knee incision with moderate effusion and some mild bloody drainage from mid incision. No erythema, skin edges look viable        Plan Nursing requested H/H due to ongoing moderate bleeding, if this looks ok we can go ahead and Dc home per previous plans [facility] IV therapy via PICC line as arranged FU as outpatient  Jenetta Loges PA-C  09/22/2018, 9:08 AM Contact # [phone]

## 2018-09-22 NOTE — Progress Notes (Signed)
Physical Therapy Treatment Patient Details Name: Thomas [name] MRN: [id number] DOB: 1970-06-14 Today's Date: 09/22/2018    History of Present Illness Pt is a 48 y.o. male s/p L TKA resection with placement of ABX spacer. PMH of left knee UKA performed on 02/10/2017 by Dr. 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 He began having symptoms of left knee pain, swelling, and stiffness, now s/p resection arthroplasy with placement of antibiotic spacer.    PT Comments    Pt continues to progress with with mobility with only occasional cues required to insure follow through with PWB status.  Pt eager for dc home this date.  Follow Up Recommendations  No PT follow up     Equipment Recommendations  None recommended by PT    Recommendations for Other Services       Precautions / Restrictions Precautions Precautions: Fall;Knee Restrictions LLE Weight Bearing: Partial weight bearing LLE Partial Weight Bearing Percentage or Pounds: 50%    Mobility  Bed Mobility Overal bed mobility: Modified Independent                Transfers Overall transfer level: Needs assistance Equipment used: Rolling walker (2 wheeled) Transfers: Sit to/from Stand Sit to Stand: Supervision         General transfer comment: min cues for use of UEs and for LE management  Ambulation/Gait Ambulation/Gait assistance: Supervision Gait Distance (Feet): 180 Feet Assistive device: Rolling walker (2 wheeled) Gait Pattern/deviations: Step-to pattern;Decreased step length - right;Decreased stance time - left Gait velocity: decr   General Gait Details: min cues for posture, position from RW and PWB   Stairs Stairs: Yes Stairs assistance: Min guard Stair Management: One rail Left;Step to pattern;Forwards;With crutches Number of  Stairs: 5 General stair comments: cues for sequence, foot/crutch placement and placement of crutch under arm to avoid PICC line   Wheelchair Mobility    Modified Rankin (Stroke Patients Only)       Balance Overall balance assessment: Mild deficits observed, not formally tested                                          Cognition Arousal/Alertness: Awake/alert Behavior During Therapy: WFL for tasks assessed/performed Overall Cognitive Status: Within Functional Limits for tasks assessed                                        Exercises Total Joint Exercises Ankle Circles/Pumps: AROM;Both;20 reps;Supine Quad Sets: AROM;Strengthening;Left;10 reps;Supine Heel Slides: AROM;Strengthening;Left;Supine;20 reps;AAROM Hip ABduction/ADduction: AROM;Strengthening;Left;Supine;20 reps Straight Leg Raises: AROM;Strengthening;Left;Supine;20 reps    General Comments        Pertinent Vitals/Pain Pain Assessment: 0-10 Pain Score: 6  Pain Location: L knee Pain Descriptors / Indicators: Sore Pain Intervention(s): Limited activity within patient's tolerance;Monitored during session;Premedicated before session;Ice applied    Home Living                      Prior Function            PT Goals (current goals can now be found in the care plan section) Acute Rehab PT Goals Patient Stated Goal: to go home, heal knee PT Goal  Formulation: With patient Time For Goal Achievement: 09/21/18 Potential to Achieve Goals: Good Progress towards PT goals: Progressing toward goals    Frequency    Min 6X/week      PT Plan Current plan remains appropriate    Co-evaluation              AM-PAC PT "6 Clicks" Mobility   Outcome Measure  Help needed turning from your back to your side while in a flat bed without using bedrails?: None Help needed moving from lying on your back to sitting on the side of a flat bed without using bedrails?: None Help  needed moving to and from a bed to a chair (including a wheelchair)?: A Little Help needed standing up from a chair using your arms (e.g., wheelchair or bedside chair)?: A Little Help needed to walk in hospital room?: A Little Help needed climbing 3-5 steps with a railing? : A Little 6 Click Score: 20    End of Session Equipment Utilized During Treatment: Gait belt Activity Tolerance: Patient tolerated treatment well Patient left: in bed;with call bell/phone within reach Nurse Communication: Mobility status PT Visit Diagnosis: Other abnormalities of gait and mobility (R26.89)     Time: [id number] PT Time Calculation (min) (ACUTE ONLY): 31 min  Charges:  $Gait Training: 8-22 mins $Therapeutic Exercise: 8-22 mins                     Thomas KaufmannHunter Andriy [name] PT Acute Rehabilitation Services Pager [phone] Office [phone]    Thomas [name] 09/22/2018, 11:40 AM

## 2018-09-22 NOTE — Progress Notes (Signed)
Pt stable with no needs at this time. Pt d/c education and instructions given. Pt left knee dressing wnl. No changes to note overall. Pt awaiting ride home. rn will continue to monitor until pt ride arrives.

## 2018-09-22 NOTE — Plan of Care (Signed)
Pt to d/c home today if hgb is stable. Home antibiotics arranged. No needs at this time. Rn medicating for pain and other needs. Rn will continue to monitor.

## 2018-09-25 NOTE — Discharge Summary (Signed)
Physician Discharge Summary   Patient ID: Thomas [name] MRN: [id number] DOB/AGE: [birth year] 48 y.o.  Admit date: 09/17/2018 Discharge date: 09/22/2018  Primary Diagnosis:  Infected left total knee revision.  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   Weight as of this encounter: 131 kg.  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  He had had a previous partial knee replacement, previous open reduction internal fixation and subsequent revision surgery due to failure.  He presented to the office in the postoperative period with some erythematous change around his wound.  He had progressive worsening of symptoms.  Attempted aspiration in the office did not reveal any fluid; however, sedimentation rate and C-reactive protein were elevated indicating concern for infection.  We discussed the need for surgical intervention.  The risks and benefits of the procedure were discussed.  The plan for 2-stage procedure discussed.  The postoperative course of IV antibiotics and duration prior to proceeding with revision were all reviewed.  Consent was obtained  for benefit of fracture management of the infection.  Laboratory Data: Admission on 09/17/2018, Discharged on 09/22/2018  Component Date Value Ref Range Status   Specimen Description 09/17/2018    Final                   Value:SYNOVIAL LT KNEE Performed at [facility], [place] [address] [place], [place] [address]    Special Requests 09/17/2018    Final                   Value:NONE Performed at [facility], [place] [address] [place], [place] [address]    Culture 09/17/2018    Final                   Value:NO ANAEROBES ISOLATED Performed at [facility], [place] [address], [place], [place] [address]    Report Status 09/17/2018 09/22/2018 FINAL   Final   Specimen Description 09/17/2018    Final                   Value:SYNOVIAL LT KNEE Performed at [facility], [place] [address], [place], [place] [address]    Special Requests 09/17/2018    Final                   Value:NONE Performed at [facility], [place] [address] [place], [place] [address]    Gram Stain 09/17/2018    Final                   Value:FEW WBC  PRESENT, PREDOMINANTLY PMN NO ORGANISMS SEEN    Culture 09/17/2018    Final                   Value:FEW STAPHYLOCOCCUS EPIDERMIDIS CRITICAL RESULT CALLED TO, READ BACK BY AND VERIFIED WITH: S. SHARPEN RN, AT [phone] 09/19/18 BY D. Victoriano Lain REGARDING CULTURE GROWTH Performed at [facility], [place] [address], [place], [place] [address]    Report Status 09/17/2018 09/20/2018 FINAL   Final   Organism ID, Bacteria 09/17/2018 STAPHYLOCOCCUS EPIDERMIDIS   Final   WBC 09/18/2018 7.7  4.0 - 10.5 K/uL Final   RBC 09/18/2018 4.54  4.22 - 5.81 MIL/uL Final   Hemoglobin 09/18/2018 11.7* 13.0 - 17.0 g/dL Final   HCT 09/18/2018 36.4* 39.0 - 52.0 % Final   MCV 09/18/2018 80.2  80.0 - 100.0 fL Final   MCH 09/18/2018 25.8* 26.0 - 34.0 pg Final   MCHC 09/18/2018 32.1  30.0 - 36.0 g/dL Final   RDW  09/18/2018 15.5  11.5 - 15.5 % Final   Platelets 09/18/2018 105* 150 - 400 K/uL Final   Comment: REPEATED TO VERIFY PLATELET COUNT CONFIRMED BY SMEAR SPECIMEN CHECKED FOR CLOTS Immature Platelet Fraction may be clinically indicated, consider ordering this additional test [id number]    nRBC 09/18/2018 0.0  0.0 - 0.2 % Final   Performed at [facility], [place] [address] [place], [place] [address]   Sodium 09/18/2018 135  135 - 145 mmol/L Final   Potassium 09/18/2018 4.0  3.5 - 5.1 mmol/L Final   Chloride 09/18/2018 102  98 - 111 mmol/L Final   CO2 09/18/2018 25  22 - 32 mmol/L Final   Glucose, Bld 09/18/2018 243* 70 - 99 mg/dL Final   BUN 09/18/2018 7  6 - 20 mg/dL Final   Creatinine, Ser 09/18/2018 0.67  0.61 - 1.24 mg/dL Final   Calcium 09/18/2018 8.7* 8.9 - 10.3 mg/dL Final   GFR calc non Af Amer 09/18/2018 >60  >60 mL/min Final   GFR calc Af Amer 09/18/2018 >60  >60 mL/min Final   Anion gap 09/18/2018 8  5 - 15 Final   Performed at [facility], [place] [address] [place], [place] [address]   CRP 09/19/2018 2.3* <1.0 mg/dL Final   Performed at [place] [address] [place], [place] [address]   WBC 09/19/2018 14.5* 4.0 - 10.5 K/uL Final   RBC 09/19/2018 4.16* 4.22 - 5.81 MIL/uL Final   Hemoglobin 09/19/2018 10.7* 13.0 - 17.0 g/dL Final   HCT 09/19/2018 33.8* 39.0 - 52.0 % Final   MCV 09/19/2018 81.3  80.0 - 100.0 fL Final   MCH 09/19/2018 25.7* 26.0 - 34.0 pg Final   MCHC 09/19/2018 31.7  30.0 - 36.0 g/dL Final   RDW 09/19/2018 16.1* 11.5 - 15.5 % Final   Platelets 09/19/2018 172  150 - 400 K/uL Final   nRBC 09/19/2018 0.0  0.0 - 0.2 % Final   Neutrophils Relative % 09/19/2018 76  % Final   Neutro Abs 09/19/2018 11.1* 1.7 - 7.7 K/uL Final   Lymphocytes Relative 09/19/2018 14  % Final   Lymphs Abs 09/19/2018 2.0  0.7 - 4.0 K/uL Final   Monocytes Relative 09/19/2018 9  % Final   Monocytes  Absolute 09/19/2018 1.2* 0.1 - 1.0 K/uL Final   Eosinophils Relative 09/19/2018 0  % Final   Eosinophils Absolute 09/19/2018 0.0  0.0 - 0.5 K/uL Final   Basophils Relative 09/19/2018 0  %  Final   Basophils Absolute 09/19/2018 0.0  0.0 - 0.1 K/uL Final   Immature Granulocytes 09/19/2018 1  % Final   Abs Immature Granulocytes 09/19/2018 0.09* 0.00 - 0.07 K/uL Final   Performed at [facility], [place] [address], [place], [place] [address]   Sodium 09/19/2018 136  135 - 145 mmol/L Final   Potassium 09/19/2018 4.7  3.5 - 5.1 mmol/L Final   Chloride 09/19/2018 99  98 - 111 mmol/L Final   CO2 09/19/2018 30  22 - 32 mmol/L Final   Glucose, Bld 09/19/2018 141* 70 - 99 mg/dL Final   BUN 09/19/2018 13  6 - 20 mg/dL Final   Creatinine, Ser 09/19/2018 0.74  0.61 - 1.24 mg/dL Final   Calcium 09/19/2018 8.4* 8.9 - 10.3 mg/dL Final   GFR calc non Af Amer 09/19/2018 >60  >60 mL/min Final   GFR calc Af Amer 09/19/2018 >60  >60 mL/min Final   Anion gap 09/19/2018 7  5 - 15 Final   Performed at [facility], [place] [address], [place], [place] [address]   Vancomycin Pk 09/19/2018 21* 30 - 40 ug/mL Final   Performed at [facility], [place] [address], [place], [place] [address]   Creatinine, Ser 09/20/2018 0.75  0.61 - 1.24 mg/dL Final   GFR calc non Af Amer 09/20/2018 >60  >60 mL/min Final   GFR calc Af Amer 09/20/2018 >60  >60 mL/min Final   Performed at [place] [address] [place], [place] [address]   Vancomycin Tr 09/20/2018 11* 15 - 20 ug/mL Final   Performed at [facility], [place] [address], [place], [place] [address]   Creatinine, Ser 09/21/2018 0.71  0.61 - 1.24 mg/dL Final   GFR calc non Af Amer 09/21/2018 >60  >60 mL/min Final   GFR calc Af Amer 09/21/2018 >60  >60 mL/min Final   Performed at [place] [address], [place], [place] [address]    Creatinine, Ser 09/22/2018 0.62  0.61 - 1.24 mg/dL Final   GFR calc non Af Amer 09/22/2018 >60  >60 mL/min Final   GFR calc Af Amer 09/22/2018 >60  >60 mL/min Final   Performed at [place] [address] [place], [place] [address]   Hemoglobin 09/22/2018 10.1* 13.0 - 17.0 g/dL Final   HCT 09/22/2018 32.1* 39.0 - 52.0 % Final   Performed at [facility], [place] [address], [place], [place] [address]  Hospital Outpatient Visit on 09/13/2018  Component Date Value Ref Range Status   SARS Coronavirus 2 09/13/2018 NEGATIVE  NEGATIVE Final   Comment: (NOTE) SARS-CoV-2 target nucleic acids are NOT DETECTED. The SARS-CoV-2 RNA is generally detectable in upper and lower respiratory specimens during the acute phase of infection. Negative results do not preclude SARS-CoV-2 infection, do not rule out co-infections with other pathogens, and should not be used as the sole basis for treatment or other patient management decisions. Negative results must be combined with clinical observations, patient history, and epidemiological information. The expected result is Negative. Fact Sheet for Patients: SugarRoll.be Fact Sheet for Healthcare Providers: https://www.woods-mathews.com/ This test is not yet approved or cleared by the [place] FDA and  has been authorized for detection and/or diagnosis of SARS-CoV-2 by FDA under an Emergency Use Authorization (EUA). This EUA will remain  in effect (meaning this test can be used) for the duration of the COVID-19 declaration under Section 56  4(b)(1) of the Act, 21 U.S.C. section 360bbb-3(b)(1), unless the authorization is terminated or revoked sooner. Performed at [facility], [place] [address] [place], [place] [address]   Hospital Outpatient Visit on 09/13/2018  Component Date Value Ref Range Status   ABO/RH(D) 09/13/2018 A POS   Final     Antibody Screen 09/13/2018 NEG   Final   Sample Expiration 09/13/2018 09/20/2018,2359   Final   Extend sample reason 09/13/2018    Final                   Value:NO TRANSFUSIONS OR PREGNANCY IN THE PAST 3 MONTHS Performed at [facility], [place] [address], [place], [place] [address]    MRSA, PCR 09/13/2018 NEGATIVE  NEGATIVE Final   Staphylococcus aureus 09/13/2018 NEGATIVE  NEGATIVE Final   Comment: (NOTE) The Xpert SA Assay (FDA approved for NASAL specimens in patients 92 years of age and older), is one component of a comprehensive surveillance program. It is not intended to diagnose infection nor to guide or monitor treatment. Performed at [facility], [place] [address] [place], [place] [address]    Hgb A1c MFr Bld 09/13/2018 6.0* 4.8 - 5.6 % Final   Comment: (NOTE)         Prediabetes: 5.7 - 6.4         Diabetes: >6.4         Glycemic control for adults with diabetes: <7.0    Mean Plasma Glucose 09/13/2018 126  mg/dL Final   Comment: (NOTE) Performed At: [facility] [place], [place] [id number] Rush Farmer MD HB:[id number]    WBC 09/13/2018 8.2  4.0 - 10.5 K/uL Final   RBC 09/13/2018 5.34  4.22 - 5.81 MIL/uL Final   Hemoglobin 09/13/2018 13.3  13.0 - 17.0 g/dL Final   HCT 09/13/2018 43.0  39.0 - 52.0 % Final   MCV 09/13/2018 80.5  80.0 - 100.0 fL Final   MCH 09/13/2018 24.9* 26.0 - 34.0 pg Final   MCHC 09/13/2018 30.9  30.0 - 36.0 g/dL Final   RDW 09/13/2018 15.9* 11.5 - 15.5 % Final   Platelets 09/13/2018 150  150 - 400 K/uL Final   nRBC 09/13/2018 0.0  0.0 - 0.2 % Final   Performed at [facility], [place] [address] [place], [place] [address]   Sodium 09/13/2018 136  135 - 145 mmol/L Final   Potassium 09/13/2018 4.5  3.5 - 5.1 mmol/L Final   Chloride 09/13/2018 101  98 - 111 mmol/L Final   CO2 09/13/2018 28  22 - 32 mmol/L Final   Glucose, Bld 09/13/2018 122* 70 - 99  mg/dL Final   BUN 09/13/2018 11  6 - 20 mg/dL Final   Creatinine, Ser 09/13/2018 0.80  0.61 - 1.24 mg/dL Final   Calcium 09/13/2018 8.8* 8.9 - 10.3 mg/dL Final   GFR calc non Af Amer 09/13/2018 >60  >60 mL/min Final   GFR calc Af Amer 09/13/2018 >60  >60 mL/min Final   Anion gap 09/13/2018 7  5 - 15 Final   Performed at [facility], [place] [address] [place], [place] [address]     X-Rays:Us Ekg Site Rite  Result Date: 09/17/2018 If Site Rite image not attached, placement could not be confirmed due to current cardiac rhythm.   EKG: Orders placed or performed during the hospital encounter of 09/13/18   EKG 12-Lead   EKG 12-Lead     Hospital Course:  Thomas [name] is a 48 y.o. who was admitted to [facility]. They were brought to the operating room on 09/17/2018 and underwent Procedure(s): Resection of left total knee arthroplasty and placement of antibiotic spacer.  Patient tolerated the procedure well and was later transferred to the recovery room and then to the orthopaedic floor for postoperative care. They were given PO and IV analgesics for pain control following their surgery. They were given 24 hours of postoperative antibiotics of  Anti-infectives (From admission, onward)   Start     Dose/Rate Route Frequency Ordered Stop   09/20/18 1200  Ampicillin-Sulbactam (UNASYN) 3 g in sodium chloride 0.9 % 100 mL IVPB  Status:  Discontinued     3 g 200 mL/hr over 30 Minutes Intravenous Every 6 hours 09/20/18 0909 09/20/18 1132   09/20/18 0000  vancomycin IVPB     1,500 mg Intravenous Every 12 hours 09/20/18 1226 10/31/18 2359   09/18/18 1830  Ampicillin-Sulbactam (UNASYN) 3 g in sodium chloride 0.9 % 100 mL IVPB  Status:  Discontinued     3 g 200 mL/hr over 30 Minutes Intravenous Every 6 hours 09/18/18 1745 09/20/18 0844   09/18/18 0600  vancomycin (VANCOCIN) 1,500 mg in sodium chloride 0.9 % 500 mL IVPB  Status:  Discontinued     1,500 mg 250 mL/hr over  120 Minutes Intravenous Every 12 hours 09/17/18 1651 09/22/18 1549   09/17/18 1715  vancomycin (VANCOCIN) 2,000 mg in sodium chloride 0.9 % 500 mL IVPB     2,000 mg 250 mL/hr over 120 Minutes Intravenous  Once 09/17/18 1650 09/17/18 2007   09/17/18 1340  vancomycin (VANCOCIN) powder  Status:  Discontinued       As needed 09/17/18 1340 09/17/18 1535   09/17/18 1339  tobramycin (NEBCIN) powder  Status:  Discontinued       As needed 09/17/18 1340 09/17/18 1535   09/17/18 0600  ceFAZolin (ANCEF) 3 g in dextrose 5 % 50 mL IVPB     3 g 100 mL/hr over 30 Minutes Intravenous On call to O.R. 09/16/18 5885 09/17/18 1227     and started on DVT prophylaxis in the form of Aspirin.   PT and OT were ordered for total joint protocol. Discharge planning consulted to help with postop disposition and equipment needs. Patient had a fair night on the evening of surgery. They started to get up OOB with therapy on POD #1. Infectious disease consulted and made recommendations for IV Vancomycin and Unasyn. PICC line placed. Continued to work with therapy into POD #2. Patient was seen in rounds on POD #2 and was doing well, awaiting cultures. Continued to work with therapy through POD #3. Pt was seen during rounds on day three and was awaiting final recommendations from infectious disease and arrangement of home IV antibiotics through the [place]. Continued to work with therapy on POD #4. Patient seen on rounds on POD #4 and was doing well, awaiting arrangement of HHPT. Patient seen on POD #5 and was ready for discharge pending progression with PT. Pt worked with therapy for one additional session and was meeting their goals. He was discharged to home later that day in stable condition.  Diet: Regular diet Activity: PWB Follow-up: in 2 weeks Disposition: Home Discharged Condition: good   Discharge Instructions    Call MD / Call 911   Complete by: As directed    If you experience chest pain or shortness of breath, CALL 911  and be transported to the hospital  emergency room.  If you develope a fever above 101 F, pus (white drainage) or increased drainage or redness at the wound, or calf pain, call your surgeon's office.   Change dressing   Complete by: As directed    Maintain surgical dressing until follow up in the clinic. If the edges start to pull up, may reinforce with tape. If the dressing is no longer working, may remove and cover with gauze and tape, but must keep the area dry and clean.  Call with any questions or concerns.   Constipation Prevention   Complete by: As directed    Drink plenty of fluids.  Prune juice may be helpful.  You may use a stool softener, such as Colace (over the counter) 100 mg twice a day.  Use MiraLax (over the counter) for constipation as needed.   Diet - low sodium heart healthy   Complete by: As directed    Discharge instructions   Complete by: As directed    Maintain surgical dressing until follow up in the clinic. If the edges start to pull up, may reinforce with tape. If the dressing is no longer working, may remove and cover with gauze and tape, but must keep the area dry and clean.  Follow up in 2 weeks at [facility]. Call with any questions or concerns.   Home infusion instructions [facility] May follow [place] Dosing Protocol; May administer Cathflo as needed to maintain patency of vascular access device.; Flushing of vascular access device: per [facility] Protocol: 0.9% NaCl pre/post medica...   Complete by: As directed    Instructions: May follow [place] Dosing Protocol   Instructions: May administer Cathflo as needed to maintain patency of vascular access device.   Instructions: Flushing of vascular access device: per [facility] Protocol: 0.9% NaCl pre/post medication administration and prn patency; Heparin 100 u/ml, 28m for implanted ports and Heparin 10u/ml, 565mfor all other central venous catheters.   Instructions: May follow [facility] Anaphylaxis Protocol  for First Dose Administration in the home: 0.9% NaCl at 25-50 ml/hr to maintain IV access for protocol meds. Epinephrine 0.3 ml IV/IM PRN and Benadryl 25-50 IV/IM PRN s/s of anaphylaxis.   Instructions: [place] Coordinator (RN) to assist per patient IV care needs in the home PRN.   Increase activity slowly as tolerated   Complete by: As directed    Partial weight bearing with assist device as directed.  50% left leg   TED hose   Complete by: As directed    Use stockings (TED hose) for 2 weeks on both leg(s).  You may remove them at night for sleeping.     Allergies as of 09/22/2018   No Known Allergies     Medication List    STOP taking these medications   diclofenac sodium 1 % Gel Commonly known as: VOLTAREN   traMADol 50 MG tablet Commonly known as: ULTRAM     TAKE these medications   acetaminophen 500 MG tablet Commonly known as: TYLENOL Take 2 tablets (1,000 mg total) by mouth every 8 (eight) hours.   aspirin 81 MG chewable tablet Commonly known as: Aspirin Childrens Chew 1 tablet (81 mg total) by mouth 2 (two) times daily. Take for 4 weeks, then resume regular dose.   docusate sodium 100 MG capsule Commonly known as: Colace Take 1 capsule (100 mg total) by mouth 2 (two) times daily.   enalapril 20 MG tablet Commonly known as: VASOTEC Take 20 mg by  mouth daily.   escitalopram 20 MG tablet Commonly known as: LEXAPRO Take 20 mg by mouth daily.   ferrous sulfate 325 (65 FE) MG tablet Commonly known as: FerrouSul Take 1 tablet (325 mg total) by mouth 3 (three) times daily with meals for 14 days.   hydrOXYzine 25 MG tablet Commonly known as: ATARAX/VISTARIL Take 50 mg by mouth at bedtime as needed for anxiety.   methocarbamol 500 MG tablet Commonly known as: Robaxin Take 1 tablet (500 mg total) by mouth every 6 (six) hours as needed for muscle spasms.   metoprolol tartrate 100 MG tablet Commonly known as: LOPRESSOR Take 100 mg by mouth 2  (two) times daily.   omeprazole 20 MG capsule Commonly known as: PRILOSEC Take 20 mg by mouth daily.   Oxycodone HCl 10 MG Tabs Take 1-2 tablets (10-20 mg total) by mouth every 4 (four) hours as needed for up to 5 days for moderate pain or severe pain.   Oxycodone HCl 10 MG Tabs Take 1-2 tablets (10-20 mg total) by mouth every 4 (four) hours as needed for up to 5 days for moderate pain or severe pain. Start taking on: September 28, 2018   polyethylene glycol 17 g packet Commonly known as: MIRALAX / GLYCOLAX Take 17 g by mouth 2 (two) times daily.   pregabalin 300 MG capsule Commonly known as: LYRICA Take 300 mg by mouth 2 (two) times daily.   traZODone 100 MG tablet Commonly known as: DESYREL Take 200 mg by mouth at bedtime.   vancomycin  IVPB Inject 1,500 mg into the vein every 12 (twelve) hours. Indication: MRSE Prosthetic joint infection of knee Last Day of Therapy:  10/31/2018 Labs - _0 /17/20 1226           Discharge Care Instructions  (From admission, onward)         Start     Ordered   09/20/18 0000  Change dressing    Comments: Maintain surgical dressing until follow up in the clinic. If the edges start to pull up, may reinforce with tape.  If the dressing is no longer working, may remove and cover with gauze and tape, but must keep the area dry and clean.  Call with any questions or concerns.   09/20/18 0815         Follow-up Information    Paralee Cancel, MD. Schedule an appointment as soon as possible for a visit in 2 weeks.   Specialty: Orthopedic Surgery Contact information: [address] [place] [address] [phone]           Signed: Griffith Citron, PA-C Orthopedic Surgery 09/25/2018, 8:51 AM

## 2018-10-01 ENCOUNTER — Other Ambulatory Visit: Payer: Self-pay

## 2018-10-01 ENCOUNTER — Emergency Department (HOSPITAL_COMMUNITY): Payer: No Typology Code available for payment source

## 2018-10-01 ENCOUNTER — Encounter (HOSPITAL_COMMUNITY): Payer: Self-pay

## 2018-10-01 ENCOUNTER — Emergency Department (HOSPITAL_COMMUNITY)
Admission: EM | Admit: 2018-10-01 | Discharge: 2018-10-01 | Disposition: A | Discharge status: Home / Self Care | Payer: No Typology Code available for payment source | Attending: Emergency Medicine | Admitting: Emergency Medicine

## 2018-10-01 DIAGNOSIS — Z79899 Other long term (current) drug therapy: Secondary | ICD-10-CM | POA: Diagnosis not present

## 2018-10-01 DIAGNOSIS — T82898A Other specified complication of vascular prosthetic devices, implants and grafts, initial encounter: Secondary | ICD-10-CM | POA: Diagnosis present

## 2018-10-01 DIAGNOSIS — Z7982 Long term (current) use of aspirin: Secondary | ICD-10-CM | POA: Diagnosis not present

## 2018-10-01 DIAGNOSIS — I10 Essential (primary) hypertension: Secondary | ICD-10-CM | POA: Diagnosis not present

## 2018-10-01 DIAGNOSIS — Y812 Prosthetic and other implants, materials and accessory general- and plastic-surgery devices associated with adverse incidents: Secondary | ICD-10-CM | POA: Insufficient documentation

## 2018-10-01 DIAGNOSIS — Z96652 Presence of left artificial knee joint: Secondary | ICD-10-CM | POA: Diagnosis not present

## 2018-10-01 MED ORDER — ALTEPLASE 2 MG IJ SOLR
2.0000 mg | Freq: Once | INTRAMUSCULAR | Status: AC
  Administered 2018-10-01: 22:00:00 2 mg
  Filled 2018-10-01: qty 2

## 2018-10-01 NOTE — ED Provider Notes (Signed)
[facility] DEPT Provider Note   CSN: [id number] Arrival date & time: 10/01/18  1308    History   Chief Complaint Chief Complaint  Patient presents with  . PICC line issue    HPI Conrado Nance is a 48 y.o. male.     HPI   48yo male with hx below including PICC in place for IV abx for septic knee presents with concern for PICC line not drawing back or flushing.  Has not had dressing changed in 2 weeks.  Denies other symptoms. Has otherwise been in normal state of health, no fevers, no cough.  Past Medical History:  Diagnosis Date  . Anxiety    occasionally  . Arthritis   . Chest pain   . GERD (gastroesophageal reflux disease)   . HTN (hypertension)   . Pre-diabetes   . Sleep apnea    cpap   . SVT (supraventricular tachycardia) [facility])     Patient Active Problem List   Diagnosis Date Noted  . Left TK resection with abx spacer 09/17/2018  . 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 Left knee pain 07/16/2017  . S/P revision of total knee, left 07/16/2017  . Tibial plateau fracture, left 06/12/2017    Past Surgical History:  Procedure Laterality Date  . ATRIAL ABLATION SURGERY    . CONVERSION TO TOTAL KNEE Left 09/25/2017   Procedure: Left knee conversion from uni compartmental arthroplasty to total knee arthroplasty;  Surgeon: Paralee Cancel, MD;  Location: [facility];  Service: Orthopedics;  Laterality: Left;  90 mins  . EXCISIONAL TOTAL KNEE ARTHROPLASTY WITH ANTIBIOTIC SPACERS Left 09/17/2018   Procedure: Resection of left total knee arthroplasty and placement of antibiotic spacer;  Surgeon: Paralee Cancel, MD;  Location: [facility];  Service: Orthopedics;  Laterality: Left;  90 mins  . I&D KNEE WITH POLY EXCHANGE Left 07/16/2017   Procedure: LEFT KNEE [place] OUT WITH REIMPLANTATION OF EXISTING IMPLANT AFTER SALINE  CLEANING.;  Surgeon: Paralee Cancel, MD;  Location: [facility];  Service: Orthopedics;  Laterality: Left;  with block  . JOINT REPLACEMENT     total knee arthroplasty 09-25-17 Dr. Alvan Dame  . ORIF TIBIA PLATEAU Left 06/12/2017   Procedure: Left open reduction internal fixation medial tibial plateau fracture with poly revision medial compartmental arthroplasty;  Surgeon: Paralee Cancel, MD;  Location: [facility];  Service: Orthopedics;  Laterality: Left;  90 mins        Home Medications    Prior to Admission medications   Medication Sig Start Date End Date Taking? Authorizing Provider  aspirin (ASPIRIN CHILDRENS) 81 MG chewable tablet Chew 1 tablet (81 mg total) by mouth 2 (two) times daily. Take for 4 weeks, then resume regular dose. 09/21/18 10/21/18 Yes Babish, Rodman Key, PA-C  buPROPion (WELLBUTRIN) 100 MG tablet Take 100 mg by mouth daily.   Yes [provider]  busPIRone (BUSPAR) 15 MG tablet Take 15 mg by mouth 3 (three) times daily as needed (depression).   Yes [provider]  celecoxib (CELEBREX) 200 MG capsule Take 200 mg by mouth daily.   Yes [provider]  docusate sodium (COLACE) 100 MG capsule Take 1 capsule (100 mg total) by mouth 2 (two) times daily. 09/20/18 10/20/18 Yes Babish, Rodman Key, PA-C  enalapril (VASOTEC) 20 MG tablet Take 20 mg by mouth daily.    Yes [provider]  escitalopram (LEXAPRO) 20 MG  tablet Take 20 mg by mouth daily.   Yes [provider]  ferrous sulfate (FERROUSUL) 325 (65 FE) MG tablet Take 1 tablet (325 mg total) by mouth 3 (three) times daily with meals for 14 days. 09/20/18 10/04/18 Yes Babish, Rodman Key, PA-C  hydrOXYzine (ATARAX/VISTARIL) 25 MG tablet Take 50 mg by mouth at bedtime as needed for anxiety.    Yes [provider]  methocarbamol (ROBAXIN) 500 MG tablet Take 1 tablet (500 mg total) by mouth every 6 (six) hours as needed for muscle spasms. 09/20/18  Yes Babish, Rodman Key, PA-C  metoprolol tartrate (LOPRESSOR)  100 MG tablet Take 100 mg by mouth 2 (two) times daily.   Yes [provider]  naproxen (NAPROSYN) 500 MG tablet Take 500 mg by mouth 2 (two) times daily as needed for mild pain.   Yes [provider]  omeprazole (PRILOSEC) 20 MG capsule Take 20 mg by mouth daily.   Yes [provider]  polyethylene glycol (MIRALAX / GLYCOLAX) 17 g packet Take 17 g by mouth 2 (two) times daily. 09/20/18 10/20/18 Yes Babish, Rodman Key, PA-C  pregabalin (LYRICA) 300 MG capsule Take 300 mg by mouth 2 (two) times daily.   Yes [provider]  traMADol (ULTRAM) 50 MG tablet Take 50 mg by mouth every 6 (six) hours as needed for moderate pain.   Yes [provider]  traZODone (DESYREL) 100 MG tablet Take 200 mg by mouth at bedtime.    Yes [provider]  vancomycin IVPB Inject 1,500 mg into the vein every 12 (twelve) hours. Indication: MRSE Prosthetic joint infection of knee Last Day of Therapy:  10/31/2018 Labs - Sunday/Monday:  CBC/D, BMP, and vancomycin trough. Labs - Thursday:  CMP and vancomycin trough Labs - Every other week:  ESR and CRP 09/20/18 10/31/18 Yes Babish, Rodman Key, PA-C  verapamil (CALAN-SR) 120 MG CR tablet Take 120 mg by mouth daily.    Yes [provider]  acetaminophen (TYLENOL) 500 MG tablet Take 2 tablets (1,000 mg total) by mouth every 8 (eight) hours. Patient not taking: Reported on 10/01/2018 09/20/18 10/20/18  Danae Orleans, PA-C  cephALEXin (KEFLEX) 500 MG capsule Take 500 mg by mouth 4 (four) times daily.    [provider]  oxyCODONE 10 MG TABS Take 1-2 tablets (10-20 mg total) by mouth every 4 (four) hours as needed for up to 5 days for moderate pain or severe pain. 09/28/18 10/03/18  Danae Orleans, PA-C    Family History History reviewed. No pertinent family history.  Social History Social History   Tobacco Use  . Smoking status: Never Smoker  . Smokeless tobacco: Current User    Types: Chew, Snuff  . Tobacco  comment: 30 years ago   Substance Use Topics  . Alcohol use: Not Currently    Alcohol/week: 12.0 standard drinks    Types: 12 Standard drinks or equivalent per week    Frequency: Never    Comment: no  . Drug use: No     Allergies   Tylenol [acetaminophen]   Review of Systems Review of Systems  Constitutional: Negative for fever.  Respiratory: Negative for cough.   Gastrointestinal: Negative for diarrhea, nausea and vomiting.  Musculoskeletal: Positive for arthralgias.     Physical Exam Updated Vital Signs BP (!) 152/80   Pulse 69   Temp 98.6 F (37 C) (Oral)   Resp 15   SpO2 99%   Physical Exam Vitals signs and nursing note reviewed.  Constitutional:  General: He is not in acute distress.    Appearance: He is well-developed. He is not diaphoretic.  HENT:     Head: Normocephalic and atraumatic.  Eyes:     Conjunctiva/sclera: Conjunctivae normal.  Neck:     Musculoskeletal: Normal range of motion.  Cardiovascular:     Rate and Rhythm: Normal rate and regular rhythm.  Pulmonary:     Effort: Pulmonary effort is normal. No respiratory distress.  Musculoskeletal:     Comments: PICC line in place RUE, no surrounding erythema, catheter noted to be bent near insertion site.  Skin:    General: Skin is warm and dry.  Neurological:     Mental Status: He is alert and oriented to person, place, and time.      ED Treatments / Results  Labs (all labs ordered are listed, but only abnormal results are displayed) Labs Reviewed - No data to display  EKG None  Radiology Dg Chest 1 View  Result Date: 10/01/2018 CLINICAL DATA:  PICC line not flushing EXAM: CHEST  1 VIEW COMPARISON:  03/25/2015 FINDINGS: Right arm PICC tip in the proximal SVC. Heart size and vascularity normal. Lungs clear without infiltrate or effusion. IMPRESSION: No acute abnormality.  Right arm PICC tip proximal SVC. Electronically Signed   By: Franchot Gallo M.D.   On: 10/01/2018 16:00     Procedures Procedures (including critical care time)  Medications Ordered in ED Medications  alteplase (CATHFLO ACTIVASE) injection 2 mg (2 mg Intracatheter Given 10/01/18 2215)     Initial Impression / Assessment and Plan / ED Course  I have reviewed the triage vital signs and the nursing notes.  Pertinent labs & imaging results that were available during my care of the patient were reviewed by me and considered in my medical decision making (see chart for details).        48yo male with hx below including PICC in place for IV abx for septic knee presents with concern for PICC line not drawing back or flushing.  XR confirms PICC in appropriate position.  Called IV team to have catheter evaluated. Exam shows catheter bent, however we do not have appropriate dressings in ED. IV team came to bedside and performed dressing change and noted improvement in functino but still slow. Placed tpa into catheter and recheck with normal flow. Offered abx to pt in the ED but states he will infuse at home.  Patient discharged in stable condition with understanding of reasons to return.   Final Clinical Impressions(s) / ED Diagnoses   Final diagnoses:  Occluded PICC line, initial encounter [facility])    ED Discharge Orders    None       Gareth Morgan, MD 10/03/18 1510

## 2018-10-01 NOTE — ED Triage Notes (Signed)
Patient states his nurse came to his home to give antibiotics. Nurse states PICC line may be bent and unable to draw blood or give antibiotics. Dressing has not been changed in 2 weeks.   Patient had surgery on his left knee. Patient also states his left knee has not stopped bleeding.    A/ox4 wheelchair in triage.    PICC right arm

## 2018-10-06 ENCOUNTER — Emergency Department (HOSPITAL_COMMUNITY)
Admission: EM | Admit: 2018-10-06 | Discharge: 2018-10-06 | Disposition: A | Discharge status: Home / Self Care | Payer: No Typology Code available for payment source | Attending: Emergency Medicine | Admitting: Emergency Medicine

## 2018-10-06 ENCOUNTER — Emergency Department: Payer: Self-pay

## 2018-10-06 ENCOUNTER — Other Ambulatory Visit: Payer: Self-pay

## 2018-10-06 ENCOUNTER — Encounter (HOSPITAL_COMMUNITY): Payer: Self-pay

## 2018-10-06 DIAGNOSIS — F17228 Nicotine dependence, chewing tobacco, with other nicotine-induced disorders: Secondary | ICD-10-CM | POA: Insufficient documentation

## 2018-10-06 DIAGNOSIS — T829XXA Unspecified complication of cardiac and vascular prosthetic device, implant and graft, initial encounter: Secondary | ICD-10-CM | POA: Diagnosis present

## 2018-10-06 DIAGNOSIS — I10 Essential (primary) hypertension: Secondary | ICD-10-CM | POA: Diagnosis not present

## 2018-10-06 DIAGNOSIS — Y69 Unspecified misadventure during surgical and medical care: Secondary | ICD-10-CM | POA: Insufficient documentation

## 2018-10-06 DIAGNOSIS — F17220 Nicotine dependence, chewing tobacco, uncomplicated: Secondary | ICD-10-CM | POA: Diagnosis not present

## 2018-10-06 DIAGNOSIS — Z96652 Presence of left artificial knee joint: Secondary | ICD-10-CM | POA: Insufficient documentation

## 2018-10-06 DIAGNOSIS — Z79899 Other long term (current) drug therapy: Secondary | ICD-10-CM | POA: Insufficient documentation

## 2018-10-06 MED ORDER — SODIUM CHLORIDE 0.9% FLUSH: 10.0000 mL | Freq: Two times a day (BID) | INTRAVENOUS | Status: DC

## 2018-10-06 MED ORDER — VANCOMYCIN HCL 10 G IV SOLR
1500.0000 mg | Freq: Once | INTRAVENOUS | Status: AC
  Administered 2018-10-06: 1500 mg via INTRAVENOUS
  Filled 2018-10-06 (×2): qty 1500

## 2018-10-06 MED ORDER — SODIUM CHLORIDE 0.9% FLUSH: 10.0000 mL | INTRAVENOUS | Status: DC | PRN

## 2018-10-06 NOTE — ED Triage Notes (Addendum)
Pt states that his PICC line came out of his right arm in his sleep last night. Pt has had it in place for 2.5 weeks, but states it is needed for 4-8 weeks. No bleeding noted at this time.

## 2018-10-06 NOTE — Discharge Instructions (Signed)
We replaced your PICC line today.  Please resume your normal regimen of vancomycin tomorrow.  Return for any problems with the PICC line, redness, swelling around the site or any new or worsening symptoms.  Thank you for allowing [place] to participate in your care today.

## 2018-10-06 NOTE — Progress Notes (Signed)
Spoke with Alyssa PA re Insert PICC line order and to d/c exchange order.  Notified that PICC will be placed today.

## 2018-10-06 NOTE — ED Provider Notes (Signed)
[facility] DEPT Provider Note   CSN: [id number] Arrival date & time: 10/06/18  4098     History   Chief Complaint Chief Complaint  Patient presents with  . Vascular Access Problem    HPI Advay Volante is a 48 y.o. male.     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  Patient reports that he woke up this morning and his PICC line had fallen out.  He reports that due to this he missed his morning dose of vancomycin.  Patient denies any pain or swelling around the PICC line site.  He denies any erythema.  Patient reports routine healing of his left knee after surgery to implant the antibiotic spacer on 09-17-2018.  Denies any fevers or chills.  Past Medical History:  Diagnosis Date  . Anxiety    occasionally  . Arthritis   . Chest pain   . GERD (gastroesophageal reflux disease)   . HTN (hypertension)   . Pre-diabetes   . Sleep apnea    cpap   . SVT (supraventricular tachycardia) [facility])     Patient Active Problem List   Diagnosis Date Noted  . Left TK resection with abx spacer 09/17/2018  . 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 Left knee pain 07/16/2017  . S/P revision of total knee, left 07/16/2017  . Tibial plateau fracture, left 06/12/2017    Past Surgical History:  Procedure Laterality Date  . ATRIAL ABLATION SURGERY    . CONVERSION TO TOTAL KNEE Left 09/25/2017   Procedure: Left knee conversion from uni compartmental arthroplasty to total knee arthroplasty;  Surgeon: Paralee Cancel, MD;  [facility]: [facility];  Service: Orthopedics;  Laterality: Left;  90 mins  . EXCISIONAL TOTAL KNEE ARTHROPLASTY WITH ANTIBIOTIC SPACERS Left 09/17/2018   Procedure:  Resection of left total knee arthroplasty and placement of antibiotic spacer;  Surgeon: Paralee Cancel, MD;  Location: [facility];  Service: Orthopedics;  Laterality: Left;  90 mins  . I&D KNEE WITH POLY EXCHANGE Left 07/16/2017   Procedure: LEFT KNEE [place] OUT WITH REIMPLANTATION OF EXISTING IMPLANT AFTER SALINE CLEANING.;  Surgeon: Paralee Cancel, MD;  Location: [facility];  Service: Orthopedics;  Laterality: Left;  with block  . JOINT REPLACEMENT     total knee arthroplasty 09-25-17 Dr. Alvan Dame  . ORIF TIBIA PLATEAU Left 06/12/2017   Procedure: Left open reduction internal fixation medial tibial plateau fracture with poly revision medial compartmental arthroplasty;  Surgeon: Paralee Cancel, MD;  [facility]: [facility];  Service: Orthopedics;  Laterality: Left;  90 mins        Home Medications    Prior to Admission medications   Medication Sig Start Date End Date Taking? Authorizing Provider  aspirin (ASPIRIN CHILDRENS) 81 MG chewable tablet Chew 1 tablet (81 mg total) by mouth 2 (two) times daily. Take for 4 weeks, then resume regular dose. 09/21/18 10/21/18 Yes Babish, Rodman Key, PA-C  buPROPion (WELLBUTRIN) 100 MG tablet Take 100 mg by mouth daily.   Yes [provider]  docusate sodium (COLACE) 100 MG capsule Take 1 capsule (100 mg total) by mouth 2 (two) times daily. 09/20/18 10/20/18 Yes Babish, Rodman Key, PA-C  enalapril (VASOTEC) 20 MG tablet Take 20 mg by mouth daily.    Yes [provider]  escitalopram (LEXAPRO) 20 MG tablet Take 20 mg by mouth daily.   Yes [provider]  ferrous sulfate (FERROUSUL) 325 (65 FE) MG tablet Take 1 tablet (325 mg total) by mouth 3 (three) times daily with meals for 14 days. 09/20/18 10/06/18 Yes Babish, Rodman Key, PA-C  hydrOXYzine (ATARAX/VISTARIL) 25 MG tablet Take 50 mg by mouth at bedtime as needed for anxiety.    Yes [provider]  methocarbamol (ROBAXIN) 500 MG tablet Take 1 tablet (500 mg total) by mouth every 6 (six) hours as needed for  muscle spasms. 09/20/18  Yes Babish, Rodman Key, PA-C  metoprolol tartrate (LOPRESSOR) 100 MG tablet Take 100 mg by mouth 2 (two) times daily.   Yes [provider]  naproxen (NAPROSYN) 500 MG tablet Take 500 mg by mouth 2 (two) times daily as needed for mild pain.   Yes [provider]  omeprazole (PRILOSEC) 20 MG capsule Take 20 mg by mouth daily.   Yes [provider]  Oxycodone HCl 10 MG TABS Take 10-20 mg by mouth every 6 (six) hours as needed (pain).    Yes [provider]  polyethylene glycol (MIRALAX / GLYCOLAX) 17 g packet Take 17 g by mouth 2 (two) times daily. 09/20/18 10/20/18 Yes Babish, Rodman Key, PA-C  pregabalin (LYRICA) 300 MG capsule Take 300 mg by mouth 2 (two) times daily.   Yes [provider]  traMADol (ULTRAM) 50 MG tablet Take 50 mg by mouth every 6 (six) hours as needed for moderate pain.   Yes [provider]  traZODone (DESYREL) 100 MG tablet Take 200 mg by mouth at bedtime.    Yes [provider]  vancomycin IVPB Inject 1,500 mg into the vein every 12 (twelve) hours. Indication: MRSE Prosthetic joint infection of knee Last Day of Therapy:  10/31/2018 Labs - Sunday/Monday:  CBC/D, BMP, and vancomycin trough. Labs - Thursday:  CMP and vancomycin trough Labs - Every other week:  ESR and CRP 09/20/18 10/31/18 Yes Babish, Rodman Key, PA-C  verapamil (CALAN-SR) 120 MG CR tablet Take 120 mg by mouth daily.    Yes [provider]  acetaminophen (TYLENOL) 500 MG tablet Take 2 tablets (1,000 mg total) by mouth every 8 (eight) hours. Patient not taking: Reported on 10/01/2018 09/20/18 10/20/18  Danae Orleans, PA-C    Family History No family history on file.  Social History Social History   Tobacco Use  . Smoking status: Never Smoker  . Smokeless tobacco: Current User    Types: Chew, Snuff  . Tobacco comment: 30 years ago   Substance Use Topics  . Alcohol use: Not Currently    Alcohol/week: 12.0 standard drinks     Types: 12 Standard drinks or equivalent per week    Frequency: Never    Comment: no  . Drug use: No     Allergies   Tylenol [acetaminophen]   Review of Systems Review of Systems  Constitutional: Negative for chills and fever.  HENT: Negative for congestion and sore throat.   Eyes: Negative for visual disturbance.  Respiratory: Negative for chest tightness and shortness of breath.   Cardiovascular: Negative for chest pain.  Gastrointestinal: Negative for abdominal pain, nausea and vomiting.  Genitourinary: Negative for dysuria and flank pain.  Musculoskeletal: Negative for back pain and myalgias.  Skin: Negative for color change, rash and wound.  Neurological: Negative for dizziness, syncope and light-headedness.     Physical Exam Updated Vital Signs BP 128/78   Pulse 69   Temp 98.3 F (  36.8 C) (Oral)   Resp 16   Ht '5\' 10"'  (1.778 m)   Wt 131.5 kg   SpO2 96%   BMI 41.61 kg/m   Physical Exam Vitals signs and nursing note reviewed.  Constitutional:      General: He is not in acute distress.    Appearance: He is well-developed.  HENT:     Head: Normocephalic and atraumatic.     Mouth/Throat:     Mouth: Mucous membranes are moist.  Eyes:     Conjunctiva/sclera: Conjunctivae normal.     Pupils: Pupils are equal, round, and reactive to light.  Neck:     Musculoskeletal: Normal range of motion and neck supple.  Cardiovascular:     Rate and Rhythm: Normal rate and regular rhythm.     Heart sounds: S1 normal and S2 normal. No murmur.  Pulmonary:     Effort: Pulmonary effort is normal.     Breath sounds: Normal breath sounds. No wheezing or rales.  Abdominal:     General: There is no distension.     Palpations: Abdomen is soft.     Tenderness: There is no abdominal tenderness. There is no guarding.  Musculoskeletal: Normal range of motion.        General: No tenderness, deformity or signs of injury.     Comments: Dressing in place over anterior left knee.  No  erythema or drainage.  Previous PICC line site in the right upper arm without erythema, swelling, or drainage.  No ecchymosis.  Lymphadenopathy:     Cervical: No cervical adenopathy.  Skin:    General: Skin is warm and dry.     Findings: No erythema or rash.  Neurological:     Mental Status: He is alert.     Comments: Cranial nerves grossly intact. Patient moves extremities symmetrically and with good coordination.  Psychiatric:        Behavior: Behavior normal.        Thought Content: Thought content normal.        Judgment: Judgment normal.      ED Treatments / Results  Labs (all labs ordered are listed, but only abnormal results are displayed) Labs Reviewed - No data to display  EKG None  Radiology [place] Ekg Site Rite  Result Date: 10/06/2018 If Site Rite image not attached, placement could not be confirmed due to current cardiac rhythm.   Procedures Procedures (including critical care time)  Medications Ordered in ED Medications  sodium chloride flush (NS) 0.9 % injection 10-40 mL (has no administration in time range)  sodium chloride flush (NS) 0.9 % injection 10-40 mL (has no administration in time range)  vancomycin (VANCOCIN) 1,500 mg in sodium chloride 0.9 % 500 mL IVPB (1,500 mg Intravenous New Bag/Given 10/06/18 1314)     Initial Impression / Assessment and Plan / ED Course  I have reviewed the triage vital signs and the nursing notes.  Pertinent labs & imaging results that were available during my care of the patient were reviewed by me and considered in my medical decision making (see chart for details).        This is a well-appearing 49 year old male past medical history of recent antibiotic spacer placement for his right knee infection, on long-term IV antibiotics for prosthetic joint infection presenting for PICC line malfunction.  Patient was able to have it replaced today by PICU team.  He was given his dose of vancomycin here in the emergency  department.  Patient will miss  1 dose of vancomycin today, but he is instructed to return to his typical regimen tomorrow morning at 8 AM.  Return precautions given for any further PICC line problems, erythema, drainage or swelling of the site.  Patient is understanding and agrees with plan of care.  This is a supervised visit with Dr. Dorie Rank. Evaluation, management, and discharge planning discussed with this attending physician.  Final Clinical Impressions(s) / ED Diagnoses   Final diagnoses:  Complication associated with peripherally inserted central catheter, initial encounter      Tamala Julian 10/06/18 1530    Dorie Rank, MD 10/07/18 [id number]

## 2018-10-06 NOTE — Progress Notes (Signed)
Peripherally Inserted Central Catheter/Midline Placement  The IV Nurse has discussed with the patient and/or persons authorized to consent for the patient, the purpose of this procedure and the potential benefits and risks involved with this procedure.  The benefits include less needle sticks, lab draws from the catheter, and the patient may be discharged home with the catheter. Risks include, but not limited to, infection, bleeding, blood clot (thrombus formation), and puncture of an artery; nerve damage and irregular heartbeat and possibility to perform a PICC exchange if needed/ordered by physician.  Alternatives to this procedure were also discussed.  [employer] Power PICC patient education guide, fact sheet on infection prevention and patient information card has been provided to patient /or left at bedside.    PICC/Midline Placement Documentation  PICC Single Lumen 10/06/18 PICC Right Brachial 45 cm 1 cm (Active)  Indication for Insertion or Continuance of Line Home intravenous therapies (PICC only) 10/06/18 1501  Exposed Catheter (cm) 1 cm 10/06/18 1501  Site Assessment Clean;Dry;Intact 10/06/18 1501  Line Status Flushed;Saline locked;Blood return noted 10/06/18 1501  Dressing Type Transparent 10/06/18 1501  Dressing Status Clean;Dry;Intact;Antimicrobial disc in place 10/06/18 1501  [place] checked and tightened 10/06/18 1501  Line Adjustment (NICU/IV Team Only) No 10/06/18 1501  Dressing Intervention New dressing 10/06/18 1501  Dressing Change Due 10/13/18 10/06/18 1501       Rolena Infante 10/06/2018, 3:02 PM

## 2018-10-09 ENCOUNTER — Other Ambulatory Visit: Payer: Self-pay

## 2018-10-09 ENCOUNTER — Encounter: Payer: Self-pay | Admitting: Infectious Diseases

## 2018-10-09 ENCOUNTER — Ambulatory Visit (INDEPENDENT_AMBULATORY_CARE_PROVIDER_SITE_OTHER): Payer: No Typology Code available for payment source | Admitting: Infectious Diseases

## 2018-10-09 ENCOUNTER — Telehealth: Payer: Self-pay

## 2018-10-09 VITALS — BP 129/85 | HR 72 | Temp 98.0°F | Ht 70.0 in | Wt 285.0 lb

## 2018-10-09 DIAGNOSIS — M00862 Arthritis due to other bacteria, left knee: Secondary | ICD-10-CM | POA: Diagnosis not present

## 2018-10-09 DIAGNOSIS — E669 Obesity, unspecified: Secondary | ICD-10-CM | POA: Diagnosis not present

## 2018-10-09 DIAGNOSIS — D72829 Elevated white blood cell count, unspecified: Secondary | ICD-10-CM | POA: Diagnosis not present

## 2018-10-09 NOTE — Progress Notes (Signed)
Subjective:    Patient ID: Thomas [name], male    DOB: October 29, 1970, 48 y.o.   MRN: [id number]  HPI The patient is a 48 y/o WM with h/o LT knee hemiarthroplasty complicated by periprosthetic fracture, s/o ORIF and later conversion to total LT knee arthroplasty shortly after he suffered a dog bite to the same leg now presenting with LT knee prosthetic joint infection, s/p explantation and placement of an articulating ABX spacer and leukocytosis. Vancomycin levels have been persistently subtherapeutic since hospital d/c. [facility] reaching out to [facility] MD for dosing instead of [place].   Past Medical History:  Diagnosis Date  . Anxiety    occasionally  . Arthritis   . Chest pain   . GERD (gastroesophageal reflux disease)   . HTN (hypertension)   . Pre-diabetes   . Sleep apnea    cpap   . SVT (supraventricular tachycardia) (HCC)     Past Surgical History:  Procedure Laterality Date  . ATRIAL ABLATION SURGERY    . CONVERSION TO TOTAL KNEE Left 09/25/2017   Procedure: Left knee conversion from uni compartmental arthroplasty to total knee arthroplasty;  Surgeon: Paralee Cancel, MD;  Location: [facility];  Service: Orthopedics;  Laterality: Left;  90 mins  . EXCISIONAL TOTAL KNEE ARTHROPLASTY WITH ANTIBIOTIC SPACERS Left 09/17/2018   Procedure: Resection of left total knee arthroplasty and placement of antibiotic spacer;  Surgeon: Paralee Cancel, MD;  [facility]: [facility];  Service: Orthopedics;  Laterality: Left;  90 mins  . I&D KNEE WITH POLY EXCHANGE Left 07/16/2017   Procedure: LEFT KNEE [place] OUT WITH REIMPLANTATION OF EXISTING IMPLANT AFTER SALINE CLEANING.;  Surgeon: Paralee Cancel, MD;  [facility]: [facility];  Service: Orthopedics;  Laterality: Left;  with block  . JOINT REPLACEMENT     total knee arthroplasty 09-25-17 Dr. Alvan Dame  . ORIF TIBIA PLATEAU Left 06/12/2017   Procedure: Left open reduction internal fixation medial tibial plateau fracture with poly revision medial compartmental arthroplasty;   Surgeon: Paralee Cancel, MD;  [facility];  Service: Orthopedics;  Laterality: Left;  90 mins     No family history on file.   Social History   Tobacco Use  . Smoking status: Never Smoker  . Smokeless tobacco: Current User    Types: Chew, Snuff  . Tobacco comment: 30 years ago   Substance Use Topics  . Alcohol use: Not Currently    Alcohol/week: 12.0 standard drinks    Types: 12 Standard drinks or equivalent per week    Frequency: Never    Comment: no  . Drug use: No      reports being sexually active.   Outpatient Medications Prior to Visit  Medication Sig Dispense Refill  . acetaminophen (TYLENOL) 500 MG tablet Take 2 tablets (1,000 mg total) by mouth every 8 (eight) hours. (Patient not taking: Reported on 10/01/2018) 180 tablet 0  . aspirin (ASPIRIN CHILDRENS) 81 MG chewable tablet Chew 1 tablet (81 mg total) by mouth 2 (two) times daily. Take for 4 weeks, then resume regular dose. 60 tablet 0  . buPROPion (WELLBUTRIN) 100 MG tablet Take 100 mg by mouth daily.    Marland Kitchen docusate sodium (COLACE) 100 MG capsule Take 1 capsule (100 mg total) by mouth 2 (two) times daily. 60 capsule 0  . enalapril (VASOTEC) 20 MG tablet Take 20 mg by mouth daily.     Marland Kitchen escitalopram (LEXAPRO) 20 MG tablet Take 20 mg by mouth daily.    . ferrous sulfate (FERROUSUL) 325 (65  FE) MG tablet Take 1 tablet (325 mg total) by mouth 3 (three) times daily with meals for 14 days. 42 tablet 0  . hydrOXYzine (ATARAX/VISTARIL) 25 MG tablet Take 50 mg by mouth at bedtime as needed for anxiety.     . methocarbamol (ROBAXIN) 500 MG tablet Take 1 tablet (500 mg total) by mouth every 6 (six) hours as needed for muscle spasms. 40 tablet 1  . metoprolol tartrate (LOPRESSOR) 100 MG tablet Take 100 mg by mouth 2 (two) times daily.    . naproxen (NAPROSYN) 500 MG tablet Take 500 mg by mouth 2 (two) times daily as needed for mild pain.    Marland Kitchen omeprazole (PRILOSEC) 20 MG capsule Take 20 mg by mouth daily.    . Oxycodone HCl 10  MG TABS Take 10-20 mg by mouth every 6 (six) hours as needed (pain).     . polyethylene glycol (MIRALAX / GLYCOLAX) 17 g packet Take 17 g by mouth 2 (two) times daily. 60 packet 0  . pregabalin (LYRICA) 300 MG capsule Take 300 mg by mouth 2 (two) times daily.    . traMADol (ULTRAM) 50 MG tablet Take 50 mg by mouth every 6 (six) hours as needed for moderate pain.    . traZODone (DESYREL) 100 MG tablet Take 200 mg by mouth at bedtime.     . vancomycin IVPB Inject 1,500 mg into the vein every 12 (twelve) hours. Indication: MRSE Prosthetic joint infection of knee Last Day of Therapy:  10/31/2018 Labs - Sunday/Monday:  CBC/D, BMP, and vancomycin trough. Labs - Thursday:  CMP and vancomycin trough Labs - Every other week:  ESR and CRP 82 Units 0  . verapamil (CALAN-SR) 120 MG CR tablet Take 120 mg by mouth daily.      No facility-administered medications prior to visit.      Allergies  Allergen Reactions  . Tylenol [Acetaminophen] Other (See Comments)    Can't have because of liver issues      Review of Systems  Constitutional: Negative for chills, fatigue and fever.  HENT: Negative for congestion, hearing loss, rhinorrhea and sinus pressure.   Eyes: Negative for photophobia, pain, redness and visual disturbance.  Respiratory: Negative for apnea, cough, shortness of breath and wheezing.   Cardiovascular: Negative for chest pain and palpitations.  Gastrointestinal: Negative for abdominal pain, constipation, diarrhea, nausea and vomiting.  Endocrine: Negative for cold intolerance, heat intolerance, polydipsia and polyuria.  Genitourinary: Negative for decreased urine volume, dysuria, frequency, hematuria and testicular pain.  Musculoskeletal: Negative for back pain, myalgias and neck pain.  Skin: Negative for pallor and rash.  Allergic/Immunologic: Negative for immunocompromised state.  Neurological: Negative for dizziness, seizures, syncope, speech difficulty and light-headedness.   Hematological: Does not bruise/bleed easily.  Psychiatric/Behavioral: Negative for agitation and hallucinations. The patient is not nervous/anxious.        Objective:    Vitals:   10/09/18 1008  BP: 129/85  Pulse: 72  Temp: 98 F (36.7 C)   Physical Exam Gen: pleasant, obese, NAD, A&Ox 3 Head: NCAT, no temporal wasting evident EENT: PERRL, EOMI, MMM, adequate dentition Neck: supple, no JVD CV: NRRR, no murmurs evident Pulm: CTA bilaterally, no wheeze or retractions Abd: soft, NTND, +BS Extrems: 1+ non-pitting LE edema, 2+ pulses Skin: no rashes, adequate skin turgor Neuro: CN II-XII grossly intact, no focal neurologic deficits appreciated, gait was not assessed, A&Ox 3   Labs: Lab Results  Component Value Date   WBC 14.5 (H) 09/19/2018   HGB  10.1 (L) 09/22/2018   HCT 32.1 (L) 09/22/2018   MCV 81.3 09/19/2018   PLT 172 09/19/2018   Lab Results  Component Value Date   NA 136 09/19/2018   K 4.7 09/19/2018   CL 99 09/19/2018   CO2 30 09/19/2018   GLUCOSE 141 (H) 09/19/2018   BUN 13 09/19/2018   CREATININE 0.62 09/22/2018   CALCIUM 8.4 (L) 09/19/2018   Lab Results  Component Value Date   CRP 2.3 (H) 09/19/2018       Assessment & Plan:  The patient is a 48 y/o WM with h/o LT knee hemiarthroplasty complicated by periprosthetic fracture, s/o ORIF and later conversion to total LT knee arthroplasty shortly after he suffered a dog bite to the same leg now presenting with LT knee prosthetic joint infection, s/p explantation and placement of an articulating ABX spacer and leukocytosis.  1. LT knee PJI - Pt underwent explantation of hardware and placement of an articulating ABX spacer on 09/18/2018. 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 Baseline post-operative CRP was 2.3 on 09/19/2018. Tentative ABX d/c date is 10/31/2018. PICC line re-established on Sunday in [facility] ER after he accidentally removed  line at home I his sleep. No labs have been sent to [place] since his hospital d/c. Fortunately, [facility] called the patient during his visit, so I spoke with the pharmacist re: his dosing as he missed doses on Sunday explaining his low vanc trough on 10/07/2018 of 5.5. Asked them to continue his vanc dosing at 1500 mg IV q 12 hrs until Friday and repeat his vanc trough and a BMP that day. I provided [facility] with both our office number and fax number to ensure they would send [place] the labs we ordered a sthey were previously only sending them to his [place].   2. Leukocytosis - while this could be reactive from his recent surgery, I suspect rise is more from recent post-op ileus at this time. Encourage ambulating, judicious use of narcotics, and increasing bowel regimen to allow for resolution of his ileus. His ileus has now resolved but CBC was not repeated today following these bowel movements. His LT knee wound remains covered but reportedly "looked good" when surgeon last changed dressing.

## 2018-10-09 NOTE — Telephone Encounter (Signed)
Per Dr. Prince Rome called [facility] in [place] for recent lab work. Spoke with Hal Hope, Rn who will fax lab work to [phone] as soon as possible.  [place]

## 2018-10-09 NOTE — Patient Instructions (Signed)
Continue to take vancomycin as previously ordered. HH to redraw vancomycin trough & BMP on Friday 10/11/2018.

## 2018-10-11 NOTE — Telephone Encounter (Signed)
[facility] called to confirm fax, will send lab results now. Landis Gandy, RN

## 2018-10-15 ENCOUNTER — Telehealth: Payer: Self-pay | Admitting: Pharmacist

## 2018-10-15 NOTE — Telephone Encounter (Signed)
Spoke with pharmacist at [facility].  Patient has been on vancomycin 1500 mg q12h with low vancomycin troughs as an outpatient.  He was previously therapeutic at this dose in the hospital. Patient's PICC line had fallen out which could explain the low troughs but pharmacist was insistent that he has been subtherapeutic at this dose for 3 weeks.  Agreed to increase patient's dose to 1750 mg q12h. Usually patient has weekly troughs but asked that the pharmacist check one on new regimen before the 4th dose.  She agreed with the plan.  Patient's SCr has been stable throughout his therapy.

## 2018-10-22 ENCOUNTER — Telehealth: Payer: Self-pay

## 2018-10-22 NOTE — Telephone Encounter (Signed)
Received a call today from pharmacist at [facility] requesting permission to increase patient's dose for vancomycin. Pharmacist states that patient's trough is 6.5 and that his creatine is 0.6. Per Dr. Prince Rome pharmacist at [facility] can increase patient's dose and frequency to q8 hours. Also reminded pharmacist that patient's trough must be between 15-20. Pharmacist did not have any questions at this time, and will call office if she has any questions.  Thomas [name]

## 2018-10-23 ENCOUNTER — Encounter: Payer: Self-pay | Admitting: Infectious Diseases

## 2018-10-23 ENCOUNTER — Other Ambulatory Visit: Payer: Self-pay

## 2018-10-23 ENCOUNTER — Ambulatory Visit (INDEPENDENT_AMBULATORY_CARE_PROVIDER_SITE_OTHER): Payer: No Typology Code available for payment source | Admitting: Infectious Diseases

## 2018-10-23 VITALS — BP 123/81 | HR 62 | Temp 98.0°F

## 2018-10-23 DIAGNOSIS — D72829 Elevated white blood cell count, unspecified: Secondary | ICD-10-CM | POA: Diagnosis not present

## 2018-10-23 DIAGNOSIS — M00862 Arthritis due to other bacteria, left knee: Secondary | ICD-10-CM

## 2018-10-23 DIAGNOSIS — E669 Obesity, unspecified: Secondary | ICD-10-CM

## 2018-10-23 NOTE — Progress Notes (Signed)
Subjective:    Patient ID: Thomas [name], male    DOB: [birth year], 48 y.o.   MRN: [id number]  HPI The patient is a 48 y/o obese WM with OA and  h/o LT knee hemiarthroplasty complicated by periprosthetic fracture, s/p ORIF and later conversion to total LT knee arthroplasty in the summer of 2019 who was admitted to [facility] in 09/2018 for a LT knee prosthetic joint infection. He underwent explantation of hardware and placement of an articulating ABX spacer on 09/18/2018.MR-CoNSwas confirmed on operative cxs, so he hascontinued vancomycin dosed for a goal trough of 15-20.Baseline post-operative CRP was 2.3 on 09/19/2018. Tentative ABX d/c date is 10/31/2018.  His post hospital course has been complicated by accidental removal of his PICC line approximately 2 weeks post discharge which led to a 2-day absence of antibiotics.  Unfortunately, since his hospital discharge we have never reliably received his laboratories with the exception of this week in which [facility] called to notify us that his vancomycin level was 6.5 and his creatinine was 0.6.  Despite multiple efforts we still have not received his weekly labs including his CRP nor his white blood cell count.  Clinically, the patient's wound has healed well and he is continuing to adhere the advice of his orthopedist to not bear weight to his affected leg beyond simple ambulation with his walker.  He is anxious to complete treatment and ultimately have his left knee implant revised.   Past Medical History:  Diagnosis Date  . Anxiety    occasionally  . Arthritis   . Chest pain   . GERD (gastroesophageal reflux disease)   . HTN (hypertension)   . Pre-diabetes   . Sleep apnea    cpap   . SVT (supraventricular tachycardia) (HCC)     Past Surgical History:  Procedure Laterality Date  . ATRIAL ABLATION SURGERY    . CONVERSION TO TOTAL KNEE Left 09/25/2017   Procedure: Left knee conversion from uni compartmental  arthroplasty to total knee arthroplasty;  Surgeon: Paralee Cancel, MD;  [facility];  Service: Orthopedics;  Laterality: Left;  90 mins  . EXCISIONAL TOTAL KNEE ARTHROPLASTY WITH ANTIBIOTIC SPACERS Left 09/17/2018   Procedure: Resection of left total knee arthroplasty and placement of antibiotic spacer;  Surgeon: Paralee Cancel, MD;  [facility]: [facility];  Service: Orthopedics;  Laterality: Left;  90 mins  . I&D KNEE WITH POLY EXCHANGE Left 07/16/2017   Procedure: LEFT KNEE [place] OUT WITH REIMPLANTATION OF EXISTING IMPLANT AFTER SALINE CLEANING.;  Surgeon: Paralee Cancel, MD;  [facility]: [facility];  Service: Orthopedics;  Laterality: Left;  with block  . JOINT REPLACEMENT     total knee arthroplasty 09-25-17 Dr. Alvan Dame  . ORIF TIBIA PLATEAU Left 06/12/2017   Procedure: Left open reduction internal fixation medial tibial plateau fracture with poly revision medial compartmental arthroplasty;  Surgeon: Paralee Cancel, MD;  [facility]: [facility];  Service: Orthopedics;  Laterality: Left;  90 mins     No family history on file.   Social History   Tobacco Use  . Smoking status: Never Smoker  . Smokeless tobacco: Current User    Types: Chew, Snuff  . Tobacco comment: 30 years ago   Substance Use Topics  . Alcohol use: Not Currently    Alcohol/week: 12.0 standard drinks    Types: 12 Standard drinks or equivalent per week    Frequency: Never    Comment: no  . Drug use: No      reports  being sexually active.   Outpatient Medications Prior to Visit  Medication Sig Dispense Refill  . buPROPion (WELLBUTRIN) 100 MG tablet Take 100 mg by mouth daily.    . enalapril (VASOTEC) 20 MG tablet Take 20 mg by mouth daily.     Marland Kitchen escitalopram (LEXAPRO) 20 MG tablet Take 20 mg by mouth daily.    . hydrOXYzine (ATARAX/VISTARIL) 25 MG tablet Take 50 mg by mouth at bedtime as needed for anxiety.     . methocarbamol (ROBAXIN) 500 MG tablet Take 1 tablet (500 mg total) by mouth every 6 (six) hours as needed for muscle spasms. 40  tablet 1  . metoprolol tartrate (LOPRESSOR) 100 MG tablet Take 100 mg by mouth 2 (two) times daily.    . naproxen (NAPROSYN) 500 MG tablet Take 500 mg by mouth 2 (two) times daily as needed for mild pain.    Marland Kitchen omeprazole (PRILOSEC) 20 MG capsule Take 20 mg by mouth daily.    . Oxycodone HCl 10 MG TABS Take 10-20 mg by mouth every 6 (six) hours as needed (pain).     . pregabalin (LYRICA) 300 MG capsule Take 300 mg by mouth 2 (two) times daily.    . traMADol (ULTRAM) 50 MG tablet Take 50 mg by mouth every 6 (six) hours as needed for moderate pain.    . traZODone (DESYREL) 100 MG tablet Take 200 mg by mouth at bedtime.     . vancomycin IVPB Inject 1,500 mg into the vein every 12 (twelve) hours. Indication: MRSE Prosthetic joint infection of knee Last Day of Therapy:  10/31/2018 Labs - Sunday/Monday:  CBC/D, BMP, and vancomycin trough. Labs - Thursday:  CMP and vancomycin trough Labs - Every other week:  ESR and CRP 82 Units 0  . verapamil (CALAN-SR) 120 MG CR tablet Take 120 mg by mouth daily.     . ferrous sulfate (FERROUSUL) 325 (65 FE) MG tablet Take 1 tablet (325 mg total) by mouth 3 (three) times daily with meals for 14 days. 42 tablet 0   No facility-administered medications prior to visit.      Allergies  Allergen Reactions  . Tylenol [Acetaminophen] Other (See Comments)    Can't have because of liver issues      Review of Systems  Constitutional: Negative for chills, fatigue and fever.  HENT: Negative for congestion, hearing loss, rhinorrhea and sinus pressure.   Eyes: Negative for photophobia, pain, redness and visual disturbance.  Respiratory: Negative for apnea, cough, shortness of breath and wheezing.   Cardiovascular: Negative for chest pain and palpitations.  Gastrointestinal: Negative for abdominal pain, constipation, diarrhea, nausea and vomiting.  Endocrine: Negative for cold intolerance, heat intolerance, polydipsia and polyuria.  Genitourinary: Negative for decreased  urine volume, dysuria, frequency, hematuria and testicular pain.  Musculoskeletal: Positive for arthralgias. Negative for back pain, myalgias and neck pain.       LT knee  Skin: Negative for pallor and rash.  Allergic/Immunologic: Negative for immunocompromised state.  Neurological: Negative for dizziness, seizures, syncope, speech difficulty and light-headedness.  Hematological: Does not bruise/bleed easily.  Psychiatric/Behavioral: Negative for agitation and hallucinations. The patient is not nervous/anxious.        Objective:    Vitals:   10/23/18 0900  BP: 123/81  Pulse: 62  Temp: 98 F (36.7 C)   Physical Exam Gen: pleasant, NAD, A&Ox 3 Head: NCAT, no temporal wasting evident EENT: PERRL, EOMI, MMM, adequate dentition Neck: supple, no JVD CV: NRRR, no murmurs evident Pulm: CTA  bilaterally, no wheeze or retractions Abd: soft, obese, NTND, +BS Extrems: trace LE edema, 2+ pulses, RT PICC intact w/o surrounding erythema MSK: slight LT knee swelling at site of ABX spacer (expected), wound well-healed w/o erythema or drainage Skin: no rashes, adequate skin turgor Neuro: CN II-XII grossly intact, no focal neurologic deficits appreciated, gait was not assessed, A&Ox 3   Labs: Lab Results  Component Value Date   WBC 14.5 (H) 09/19/2018   HGB 10.1 (L) 09/22/2018   HCT 32.1 (L) 09/22/2018   MCV 81.3 09/19/2018   PLT 172 09/19/2018   Lab Results  Component Value Date   NA 136 09/19/2018   K 4.7 09/19/2018   CL 99 09/19/2018   CO2 30 09/19/2018   GLUCOSE 141 (H) 09/19/2018   BUN 13 09/19/2018   CREATININE 0.62 09/22/2018   CALCIUM 8.4 (L) 09/19/2018   Lab Results  Component Value Date   CRP 2.3 (H) 09/19/2018       Assessment & Plan:  The patient is a 48 y/o WM with h/o LT knee hemiarthroplasty complicated by periprosthetic fracture, s/p ORIF and later conversion to total LT knee arthroplasty shortly after he suffered a dog bite to the same leg now admitted with LT  knee prosthetic joint infection, s/p explantation and placement of an articulating ABX spacer and leukocytosis.  1. LT knee PJI - Pt underwent explantation of hardware and placement of an articulating ABX spacer on 09/18/2018.MR-CoNShas now been confirmed on operative cxs, so havecontinued vancomycin dosed for a goal trough of 15-20.Baseline post-operative CRP was 2.3 on 09/19/2018. Tentative ABX d/c date is 10/31/2018 but we may have to extend his ABX due to serially subtherapeutic vancomycin troughs. We continue to receive no labs since his hospital d/c from [place] despite conversations with [facility] pharmacist and clear instructions on his OPAT orders of where to fax results. [facility] verbally reported his Cr as 0.6 and his vanc trough at 6.5 this week, but no CRP value is available. I again provided [facility] with both our office number and fax number to ensure they would send [place] the labs we ordered as they continue to only send them to his [place]. I have asked our ID pharmacist to assist with obtaining the patient's labs so that clinical decisions can be made regarding his antibiotics for dosing and completion of treatment.  2. Leukocytosis - while this could be reactive from his recent surgery, I suspect rise is more fromrecentpost-op ileus at this time. His ileus resolved over a month ago, but our office has not received any further CBC reports.  I have emphasized the importance of his labs and making clinical decisions including documentation that his white blood cell count is returned to normal.  A weekly CBC with differential has been ordered his home health and infusion company both have our fax numbers to send these results to [place].

## 2018-10-23 NOTE — Progress Notes (Signed)
[facility] and spoke with Tillie Rung. Gave verbal orders per Dr. Prince Rome to draw CRP today and with weekly labs next week. Verbal order read back and understood. Also made [facility] aware to fax all labs to [facility] triage/pharmacy and provided [facility] with both fax numbers.  Thomas [name]

## 2018-10-23 NOTE — Patient Instructions (Signed)
Continue to take vancomycin q 8 hrs as instructed. Please ask your [facility] in [place], [place] to fax [place] your lab results to [phone] and [phone] every week. HH to draw CRP every week.

## 2018-10-31 ENCOUNTER — Telehealth: Payer: Self-pay

## 2018-10-31 NOTE — Telephone Encounter (Signed)
Called placed to [facility] and spoke with [facility] Made aware of vanc trough of 6.7 Gave verbal orders per Dr. Prince Rome to extend IV vancomycin for 2 weeks with new end of therapy date of 11/15/18.  Eugenia Mcalpine

## 2018-10-31 NOTE — Telephone Encounter (Signed)
Received labs from [facility] via fax.  Labs as of 10/28/18  WBC:6.3 vanc trough:9.7  crp: 16.7  cr: 0.60  Routing to Dr. Prince Rome to make aware.  Eugenia Mcalpine

## 2018-10-31 NOTE — Telephone Encounter (Signed)
Patient called inquiring about when his picc line was going to be pulled out. Routing to provider for advise.  Eugenia Mcalpine

## 2018-10-31 NOTE — Telephone Encounter (Signed)
[facility] and asked to send recent labs so Dr. Prince Rome can determine IV therapy. Provided fax number [phone] Thomas [name]

## 2018-10-31 NOTE — Telephone Encounter (Signed)
His vancomycin levels have been subtherapeutic most of his course. We have struggled to get labs on him the entire way. I need to see his wbc, vanc trough, crp, and cr to make this decision as we may have to extend his iv treatment. Can you please obtain these for me?

## 2018-10-31 NOTE — Telephone Encounter (Signed)
Thanks for getting his results to me. I think he'd be best served by having his vancomycin extended by 2 more weeks. Have the [facility] pharmacist increase his vanc again and continue vanc until 11/15/18.

## 2018-11-13 ENCOUNTER — Telehealth: Payer: Self-pay

## 2018-11-13 NOTE — Telephone Encounter (Signed)
WBC: 6.2 CR:0 .60 CRP: 12.9 vanc trough: 13.6 Aundria Rud, CMA

## 2018-11-13 NOTE — Telephone Encounter (Signed)
Register to have most recent blood work faxed to our office. Labs will be faxed later this evening. [place]

## 2018-11-13 NOTE — Telephone Encounter (Signed)
Patient called office to inform us that his last dose of IV antibiotics is this Friday 9/11.Patient would like to know if he can have his picc line removed after last dose or if they will be extended. Patient would also like to make sure orders are called into [facility] before this weekend. Will route message to advise wether picc can be removed or not, and to see when patient should follow up with office. [place]

## 2018-11-13 NOTE — Telephone Encounter (Signed)
Can you please send me his WBC, Cr. CRP, and vanc trough results from this week? It will help me decide for him.

## 2018-11-13 NOTE — Telephone Encounter (Signed)
I think his crp is as low as we're going to be able to get it. Have them remove his picc and please make sure he has follow up with his orthopedist within 1 week.

## 2018-11-14 NOTE — Telephone Encounter (Signed)
Per Dr. Prince Rome called [facility] with verbal order to pull picc after last dose on 9/11. Spoke with Dianna,Rn who was able to take verbal order.   Left voicemail with patient to let him know that his picc line will be removed after last dose. Also advised patient to make sure to follow up with Orthopedic in one week. [place]

## 2018-12-06 NOTE — H&P (Signed)
Thomas [name] is an 48 y.o. male.    Chief Complaint: S/P left TKA resection and placement of antibiotic spacer  Procedure:    Reimplantation of the left TKA  HPI: Pt is a 48 y.o. male who underwent a resection of the left TKA due to infection on 09/17/18. Since that time he has a course of IV antibiotics and followed by infectious Disease.  He has been off of antibiotics and no return of symptoms.  Plan is to remove the spacer and reimplant a left TKA.  Various options are discussed with the patient. Risks, benefits and expectations were discussed with the patient. Patient understand the risks, benefits and expectations and wishes to proceed with surgery.  Risks including but not limited to the risk of anesthesia, blood clots, nerve damage, blood vessel damage, failure of the prosthesis, infection and up to and including death.  It was also discussed about the possibility of having to do a repeat spacer and IV antibiotics, depending on how the knee looks.   Patient understand the risks, benefits and expectations and wishes to proceed with surgery.    PCP: Sondra Come, MD  D/C Plans:       Home  Post-op Meds:       No Rx given   Tranexamic Acid:      To be given - IV   Decadron:      Is to be given  FYI:      ASA  Norco  CPAP  DME:   Pt already has equipment   PT:   OPPT       PMH: Past Medical History:  Diagnosis Date  . Anxiety    occasionally  . Arthritis   . Chest pain   . GERD (gastroesophageal reflux disease)   . HTN (hypertension)   . Pre-diabetes   . Sleep apnea    cpap   . SVT (supraventricular tachycardia) (HCC)     PSH: Past Surgical History:  Procedure Laterality Date  . ATRIAL ABLATION SURGERY    . CONVERSION TO TOTAL KNEE Left 09/25/2017   Procedure: Left knee conversion from uni compartmental arthroplasty to total knee arthroplasty;  Surgeon: Durene Romans, MD;  Location: [facility];  Service: Orthopedics;  Laterality: Left;  90 mins  . EXCISIONAL  TOTAL KNEE ARTHROPLASTY WITH ANTIBIOTIC SPACERS Left 09/17/2018   Procedure: Resection of left total knee arthroplasty and placement of antibiotic spacer;  Surgeon: Durene Romans, MD;  Location: [facility];  Service: Orthopedics;  Laterality: Left;  90 mins  . I&D KNEE WITH POLY EXCHANGE Left 07/16/2017   Procedure: LEFT KNEE WASH OUT WITH REIMPLANTATION OF EXISTING IMPLANT AFTER SALINE CLEANING.;  Surgeon: Durene Romans, MD;  Location: [facility];  Service: Orthopedics;  Laterality: Left;  with block  . JOINT REPLACEMENT     total knee arthroplasty 09-25-17 Dr. Charlann Boxer  . ORIF TIBIA PLATEAU Left 06/12/2017   Procedure: Left open reduction internal fixation medial tibial plateau fracture with poly revision medial compartmental arthroplasty;  Surgeon: Durene Romans, MD;  Location: [facility];  Service: Orthopedics;  Laterality: Left;  90 mins    Social History:  reports that he has never smoked. His smokeless tobacco use includes chew and snuff. He reports previous alcohol use of about 12.0 standard drinks of alcohol per week. He reports that he does not use drugs.  Allergies:  Allergies  Allergen Reactions  . Tylenol [Acetaminophen] Other (See Comments)    Can't have because of liver issues  Medications: No current facility-administered medications for this encounter.    Current Outpatient Medications  Medication Sig Dispense Refill  . buPROPion (WELLBUTRIN) 100 MG tablet Take 100 mg by mouth daily.    . enalapril (VASOTEC) 20 MG tablet Take 20 mg by mouth daily.     Marland Kitchen escitalopram (LEXAPRO) 20 MG tablet Take 20 mg by mouth daily.    . ferrous sulfate (FERROUSUL) 325 (65 FE) MG tablet Take 1 tablet (325 mg total) by mouth 3 (three) times daily with meals for 14 days. 42 tablet 0  . hydrOXYzine (ATARAX/VISTARIL) 25 MG tablet Take 50 mg by mouth at bedtime as needed for anxiety.     . methocarbamol (ROBAXIN) 500 MG tablet Take 1 tablet (500 mg total) by mouth every 6 (six) hours as needed for muscle spasms.  40 tablet 1  . metoprolol tartrate (LOPRESSOR) 100 MG tablet Take 100 mg by mouth 2 (two) times daily.    . naproxen (NAPROSYN) 500 MG tablet Take 500 mg by mouth 2 (two) times daily as needed for mild pain.    Marland Kitchen omeprazole (PRILOSEC) 20 MG capsule Take 20 mg by mouth daily.    . Oxycodone HCl 10 MG TABS Take 10-20 mg by mouth every 6 (six) hours as needed (pain).     . pregabalin (LYRICA) 300 MG capsule Take 300 mg by mouth 2 (two) times daily.    . traMADol (ULTRAM) 50 MG tablet Take 50 mg by mouth every 6 (six) hours as needed for moderate pain.    . traZODone (DESYREL) 100 MG tablet Take 200 mg by mouth at bedtime.     . verapamil (CALAN-SR) 120 MG CR tablet Take 120 mg by mouth daily.          Review of Systems  Constitutional: Negative.   HENT: Negative.   Eyes: Negative.   Respiratory: Negative.   Cardiovascular: Negative.   Gastrointestinal: Positive for heartburn.  Genitourinary: Negative.   Musculoskeletal: Positive for joint pain.  Skin: Negative.   Neurological: Negative.   Endo/Heme/Allergies: Negative.   Psychiatric/Behavioral: The patient is nervous/anxious.        Physical Exam  Constitutional: He is oriented to person, place, and time. He appears well-developed.  HENT:  Head: Normocephalic.  Eyes: Pupils are equal, round, and reactive to light.  Neck: Neck supple. No JVD present. No tracheal deviation present. No thyromegaly present.  Cardiovascular: Normal rate, regular rhythm and intact distal pulses.  Respiratory: Effort normal and breath sounds normal. No respiratory distress. He has no wheezes.  GI: Soft. There is no abdominal tenderness. There is no guarding.  Musculoskeletal:     Left knee: He exhibits decreased range of motion and swelling. He exhibits no ecchymosis, no deformity, no laceration (healed previous incision) and no erythema. Tenderness found.  Lymphadenopathy:    He has no cervical adenopathy.  Neurological: He is alert and oriented  to person, place, and time.  Skin: Skin is warm and dry.  Psychiatric: He has a normal mood and affect.       Assessment/Plan Assessment:    S/P left TKA resection and placement of antibiotic spacer  Plan: Patient will undergo a reimplantation of the left TK on 12/26/2018 per Dr. Alvan Dame at [facility]. Risks benefits and expectations were discussed with the patient. Patient understand risks, benefits and expectations and wishes to proceed.    Thomas Pugh Lars Jeziorski   PA-C  12/06/2018, 3:17 PM

## 2018-12-19 NOTE — Patient Instructions (Addendum)
DUE TO COVID-19 ONLY ONE VISITOR IS ALLOWED TO COME WITH YOU AND STAY IN THE WAITING ROOM ONLY DURING PRE OP AND PROCEDURE DAY OF SURGERY. THE 1 VISITOR MAY VISIT WITH YOU AFTER SURGERY IN YOUR PRIVATE ROOM DURING VISITING HOURS ONLY!  YOU NEED TO HAVE A COVID 19 TEST ON_Monday 10/19/2020______  0835 am______, THIS TEST MUST BE DONE BEFORE SURGERY, COME  [address], [place] , [address].  [facility]) ONCE YOUR COVID TEST IS COMPLETED, PLEASE BEGIN THE QUARANTINE INSTRUCTIONS AS OUTLINED IN YOUR HANDOUT.                Thomas [name]   Your procedure is scheduled on: Thursday 12/26/2018   Report to [facility] Main  Entrance    Report to Short Stay 5:30 AM               Please bring CPAP mask and tubing with you to the hospital the day of surgery!    Call this number if you have problems the morning of surgery [phone]    Remember: Do not eat food after Midnight.    BRUSH YOUR TEETH MORNING OF SURGERY AND RINSE YOUR MOUTH OUT, NO CHEWING GUM CANDY OR MINTS.   NO SOLID FOOD AFTER MIDNIGHT THE NIGHT PRIOR TO SURGERY.    NOTHING BY MOUTH EXCEPT CLEAR LIQUIDS UNTIL 4:15am .    PLEASE FINISH Gatorade  Zero  DRINK PER SURGEON ORDER  WHICH NEEDS TO BE COMPLETED AT 4:15am.   CLEAR LIQUID DIET   Foods Allowed                                                                     Foods Excluded  Coffee and tea, regular and decaf                             liquids that you cannot  Plain Jell-O any favor except red or purple             see through such as: Fruit ices (not with fruit pulp)                                     milk, soups, orange juice  Iced Popsicles                                    All solid food Carbonated beverages, regular and diet                                    Cranberry, grape and apple juices Sports drinks like Gatorade Lightly seasoned clear broth or consume(fat free) Sugar, honey syrup  Sample Menu Breakfast                                 Lunch  Supper Cranberry juice                    Beef broth                            Chicken broth Jell-O                                     Grape juice                           Apple juice Coffee or tea                        Jell-O                                      Popsicle                                                Coffee or tea                        Coffee or tea  _____________________________________________________________________       Take these medicines the morning of surgery with A SIP OF WATER: Bupropion ( Wellbutrin), Escitalopram (Lexapro), Metoprolol tartrate (Lopressor), Verapamil (Calan-SR),  Omeprazole (Prilosec)                                You may not have any metal on your body including hair pins and              piercings  Do not wear jewelry, make-up, lotions, powders or perfumes, deodorant                          Men may shave face and neck.   Do not bring valuables to the hospital. [place].  Contacts, dentures or bridgework may not be worn into surgery.  Leave suitcase in the car. After surgery it may be brought to your room.                Please read over the following fact sheets you were given: _____________________________________________________________________             [facility] - Preparing for Surgery Before surgery, you can play an important role.  Because skin is not sterile, your skin needs to be as free of germs as possible.  You can reduce the number of germs on your skin by washing with CHG (chlorahexidine gluconate) soap before surgery.  CHG is an antiseptic cleaner which kills germs and bonds with the skin to continue killing germs even after washing. Please DO NOT use if you have an allergy to CHG or antibacterial soaps.  If your skin becomes reddened/irritated stop using the CHG and inform your nurse when you  arrive at Short Stay. Do not shave (including legs  and underarms) for at least 48 hours prior to the first CHG shower.  You may shave your face/neck. Please follow these instructions carefully:  1.  Shower with CHG Soap the night before surgery and the  morning of Surgery.  2.  If you choose to wash your hair, wash your hair first as usual with your  normal  shampoo.  3.  After you shampoo, rinse your hair and body thoroughly to remove the  shampoo.                            4.  Use CHG as you would any other liquid soap.  You can apply chg directly  to the skin and wash                       Gently with a scrungie or clean washcloth.  5.  Apply the CHG Soap to your body ONLY FROM THE NECK DOWN.   Do not use on face/ open                           Wound or open sores. Avoid contact with eyes, ears mouth and genitals (private parts).                       Wash face,  Genitals (private parts) with your normal soap.             6.  Wash thoroughly, paying special attention to the area where your surgery  will be performed.  7.  Thoroughly rinse your body with warm water from the neck down.  8.  DO NOT shower/wash with your normal soap after using and rinsing off  the CHG Soap.                9.  Pat yourself dry with a clean towel.            10.  Wear clean pajamas.            11.  Place clean sheets on your bed the night of your first shower and do not  sleep with pets. Day of Surgery : Do not apply any lotions/deodorants the morning of surgery.  Please wear clean clothes to the hospital/surgery center.  FAILURE TO FOLLOW THESE INSTRUCTIONS MAY RESULT IN THE CANCELLATION OF YOUR SURGERY PATIENT SIGNATURE_________________________________  NURSE SIGNATURE__________________________________  ________________________________________________________________________   Thomas MireIncentive Spirometer  An incentive spirometer is a tool that can help keep your lungs clear and active. This tool measures how  well you are filling your lungs with each breath. Taking long deep breaths may help reverse or decrease the chance of developing breathing (pulmonary) problems (especially infection) following:  A long period of time when you are unable to move or be active. BEFORE THE PROCEDURE   If the spirometer includes an indicator to show your best effort, your nurse or respiratory therapist will set it to a desired goal.  If possible, sit up straight or lean slightly forward. Try not to slouch.  Hold the incentive spirometer in an upright position. INSTRUCTIONS FOR USE  1. Sit on the edge of your bed if possible, or sit up as far as you can in bed or on a chair. 2. Hold the incentive spirometer in an upright position. 3. Breathe out normally. 4. Place the mouthpiece in your mouth and seal  your lips tightly around it. 5. Breathe in slowly and as deeply as possible, raising the piston or the ball toward the top of the column. 6. Hold your breath for 3-5 seconds or for as long as possible. Allow the piston or ball to fall to the bottom of the column. 7. Remove the mouthpiece from your mouth and breathe out normally. 8. Rest for a few seconds and repeat Steps 1 through 7 at least 10 times every 1-2 hours when you are awake. Take your time and take a few normal breaths between deep breaths. 9. The spirometer may include an indicator to show your best effort. Use the indicator as a goal to work toward during each repetition. 10. After each set of 10 deep breaths, practice coughing to be sure your lungs are clear. If you have an incision (the cut made at the time of surgery), support your incision when coughing by placing a pillow or rolled up towels firmly against it. Once you are able to get out of bed, walk around indoors and cough well. You may stop using the incentive spirometer when instructed by your caregiver.  RISKS AND COMPLICATIONS  Take your time so you do not get dizzy or light-headed.  If you  are in pain, you may need to take or ask for pain medication before doing incentive spirometry. It is harder to take a deep breath if you are having pain. AFTER USE  Rest and breathe slowly and easily.  It can be helpful to keep track of a log of your progress. Your caregiver can provide you with a simple table to help with this. If you are using the spirometer at home, follow these instructions: SEEK MEDICAL CARE IF:   You are having difficultly using the spirometer.  You have trouble using the spirometer as often as instructed.  Your pain medication is not giving enough relief while using the spirometer.  You develop fever of 100.5 F (38.1 C) or higher. SEEK IMMEDIATE MEDICAL CARE IF:   You cough up bloody sputum that had not been present before.  You develop fever of 102 F (38.9 C) or greater.  You develop worsening pain at or near the incision site. MAKE SURE YOU:   Understand these instructions.  Will watch your condition.  Will get help right away if you are not doing well or get worse. Document Released: 07/03/2006 Document Revised: 05/15/2011 Document Reviewed: 09/03/2006 Exit[employer] Patient Information 2014 [employer], [place].   ________________________________________________________________________  WHAT IS A BLOOD TRANSFUSION? Blood Transfusion Information  A transfusion is the replacement of blood or some of its parts. Blood is made up of multiple cells which provide different functions.  Red blood cells carry oxygen and are used for blood loss replacement.  White blood cells fight against infection.  Platelets control bleeding.  Plasma helps clot blood.  Other blood products are available for specialized needs, such as hemophilia or other clotting disorders. BEFORE THE TRANSFUSION  Who gives blood for transfusions?   Healthy volunteers who are fully evaluated to make sure their blood is safe. This is blood bank blood. Transfusion therapy is the safest  it has ever been in the practice of medicine. Before blood is taken from a donor, a complete history is taken to make sure that person has no history of diseases nor engages in risky social behavior (examples are intravenous drug use or sexual activity with multiple partners). The donor's travel history is screened to minimize risk of transmitting infections, such  as malaria. The donated blood is tested for signs of infectious diseases, such as HIV and hepatitis. The blood is then tested to be sure it is compatible with you in order to minimize the chance of a transfusion reaction. If you or a relative donates blood, this is often done in anticipation of surgery and is not appropriate for emergency situations. It takes many days to process the donated blood. RISKS AND COMPLICATIONS Although transfusion therapy is very safe and saves many lives, the main dangers of transfusion include:   Getting an infectious disease.  Developing a transfusion reaction. This is an allergic reaction to something in the blood you were given. Every precaution is taken to prevent this. The decision to have a blood transfusion has been considered carefully by your caregiver before blood is given. Blood is not given unless the benefits outweigh the risks. AFTER THE TRANSFUSION  Right after receiving a blood transfusion, you will usually feel much better and more energetic. This is especially true if your red blood cells have gotten low (anemic). The transfusion raises the level of the red blood cells which carry oxygen, and this usually causes an energy increase.  The nurse administering the transfusion will monitor you carefully for complications. HOME CARE INSTRUCTIONS  No special instructions are needed after a transfusion. You may find your energy is better. Speak with your caregiver about any limitations on activity for underlying diseases you may have. SEEK MEDICAL CARE IF:   Your condition is not improving after  your transfusion.  You develop redness or irritation at the intravenous (IV) site. SEEK IMMEDIATE MEDICAL CARE IF:  Any of the following symptoms occur over the next 12 hours:  Shaking chills.  You have a temperature by mouth above 102 F (38.9 C), not controlled by medicine.  Chest, back, or muscle pain.  People around you feel you are not acting correctly or are confused.  Shortness of breath or difficulty breathing.  Dizziness and fainting.  You get a rash or develop hives.  You have a decrease in urine output.  Your urine turns a dark color or changes to pink, red, or brown. Any of the following symptoms occur over the next 10 days:  You have a temperature by mouth above 102 F (38.9 C), not controlled by medicine.  Shortness of breath.  Weakness after normal activity.  The white part of the eye turns yellow (jaundice).  You have a decrease in the amount of urine or are urinating less often.  Your urine turns a dark color or changes to pink, red, or brown. Document Released: 02/18/2000 Document Revised: 05/15/2011 Document Reviewed: 10/07/2007 [facility] Patient Information 2014 [place], [place].  _______________________________________________________________________

## 2018-12-20 ENCOUNTER — Other Ambulatory Visit: Payer: Self-pay

## 2018-12-20 ENCOUNTER — Encounter (HOSPITAL_COMMUNITY): Payer: Self-pay

## 2018-12-20 ENCOUNTER — Encounter (HOSPITAL_COMMUNITY)
Admission: RE | Admit: 2018-12-20 | Discharge: 2018-12-20 | Disposition: A | Discharge status: Home / Self Care | Payer: No Typology Code available for payment source | Source: Ambulatory Visit | Attending: Orthopedic Surgery | Admitting: Orthopedic Surgery

## 2018-12-20 ENCOUNTER — Encounter (INDEPENDENT_AMBULATORY_CARE_PROVIDER_SITE_OTHER): Payer: Self-pay

## 2018-12-20 DIAGNOSIS — Z01812 Encounter for preprocedural laboratory examination: Secondary | ICD-10-CM | POA: Insufficient documentation

## 2018-12-20 HISTORY — DX: Cardiac arrhythmia, unspecified: I49.9

## 2018-12-20 LAB — BASIC METABOLIC PANEL
Anion gap: 11 (ref 5–15)
BUN: 13 mg/dL (ref 6–20)
CO2: 26 mmol/L (ref 22–32)
Calcium: 9.3 mg/dL (ref 8.9–10.3)
Chloride: 101 mmol/L (ref 98–111)
Creatinine, Ser: 0.83 mg/dL (ref 0.61–1.24)
GFR calc Af Amer: >60 mL/min (ref >60)
GFR calc non Af Amer: >60 mL/min (ref >60)
Glucose, Bld: 119 mg/dL — ABNORMAL HIGH (ref 70–99)
Potassium: 4.4 mmol/L (ref 3.5–5.1)
Sodium: 138 mmol/L (ref 135–145)

## 2018-12-20 LAB — CBC
HCT: 42.8 % (ref 39.0–52.0)
Hemoglobin: 13.6 g/dL (ref 13.0–17.0)
MCH: 25.3 pg — ABNORMAL LOW (ref 26.0–34.0)
MCHC: 31.8 g/dL (ref 30.0–36.0)
MCV: 79.6 fL — ABNORMAL LOW (ref 80.0–100.0)
Platelets: 192 10*3/uL (ref 150–400)
RBC: 5.38 MIL/uL (ref 4.22–5.81)
RDW: 15.4 % (ref 11.5–15.5)
WBC: 8.8 10*3/uL (ref 4.0–10.5)
nRBC: 0 % (ref 0.0–0.2)

## 2018-12-20 LAB — SURGICAL PCR SCREEN
MRSA, PCR: NEGATIVE
Staphylococcus aureus: POSITIVE — AB

## 2018-12-20 LAB — HEMOGLOBIN A1C
Hgb A1c MFr Bld: 6 % — ABNORMAL HIGH (ref 4.8–5.6)
Mean Plasma Glucose: 125.5 mg/dL

## 2018-12-20 NOTE — Progress Notes (Signed)
PCP - Dr. Windy Fast at the [facility], [place] Cardiologist - NONE but did wear a holter monitor for 2 weeks from 12/04/2018 for irregular heartbeat- requested by Dr. Sherral Hammers at [facility]  Chest x-ray - 10/01/2018 1 view EKG - 09/13/2018 Stress Test - none ECHO - 07/22/2006 and 03/26/2015 at [facility] Cardiac Cath - none Atrial ablation- 2008 at [facility] for tachycardia  Sleep Study - about 5 years ago at [facility] CPAP - yes when thinks about it  Fasting Blood Sugar - 108/115 Checks Blood Sugar __1___ times a week  Blood Thinner Instructions:none Aspirin Instructions: Last Dose:none  Anesthesia review:  Chart given to Konrad Felix, PA to review! Patient has a history of HTN, Sleep apnea, Pre-Diabetes, chest pain and SVT  2017  Patient denies shortness of breath, fever, cough and chest pain at PAT appointment   Patient verbalized understanding of instructions that were given to them at the PAT appointment. Patient was also instructed that they will need to review over the PAT instructions again at home before surgery.

## 2018-12-23 ENCOUNTER — Other Ambulatory Visit (HOSPITAL_COMMUNITY)
Admission: RE | Admit: 2018-12-23 | Discharge: 2018-12-23 | Disposition: A | Discharge status: Home / Self Care | Payer: No Typology Code available for payment source | Source: Ambulatory Visit | Attending: Orthopedic Surgery | Admitting: Orthopedic Surgery

## 2018-12-24 LAB — NOVEL CORONAVIRUS, NAA (HOSP ORDER, SEND-OUT TO REF LAB; TAT 18-24 HRS): SARS-CoV-2, NAA: NOT DETECTED

## 2018-12-25 MED ORDER — DEXTROSE 5 % IV SOLN
3.0000 g | INTRAVENOUS | Status: AC
  Administered 2018-12-26: 07:00:00 3 g via INTRAVENOUS
  Filled 2018-12-25: qty 3

## 2018-12-25 NOTE — Anesthesia Preprocedure Evaluation (Addendum)
Anesthesia Evaluation  Patient identified by MRN, date of birth, ID band Patient awake    Reviewed: Allergy & Precautions, H&P , NPO status , Patient's Chart, lab work & pertinent test results, reviewed documented beta blocker date and time   Airway Mallampati: III  TM Distance: >3 FB Neck ROM: Full    Dental no notable dental hx. (+) Teeth Intact, Dental Advisory Given   Pulmonary sleep apnea and Continuous Positive Airway Pressure Ventilation ,    Pulmonary exam normal breath sounds clear to auscultation       Cardiovascular Exercise Tolerance: Good hypertension, Pt. on medications and Pt. on home beta blockers + dysrhythmias  Rhythm:Regular Rate:Normal     Neuro/Psych Anxiety negative neurological ROS     GI/Hepatic Neg liver ROS, GERD  Medicated and Controlled,  Endo/Other  Morbid obesity  Renal/GU negative Renal ROS  negative genitourinary   Musculoskeletal  (+) Arthritis , Osteoarthritis,    Abdominal   Peds  Hematology negative hematology ROS (+)   Anesthesia Other Findings   Reproductive/Obstetrics negative OB ROS                            Anesthesia Physical Anesthesia Plan  ASA: III  Anesthesia Plan: Spinal   Post-op Pain Management:  [facility] for Post-op pain   Induction: Intravenous  PONV Risk Score and Plan: 2 and Propofol infusion, Dexamethasone, Ondansetron and Midazolam  Airway Management Planned: Simple Face Mask  Additional Equipment:   Intra-op Plan:   Post-operative Plan:   Informed Consent: I have reviewed the patients History and Physical, chart, labs and discussed the procedure including the risks, benefits and alternatives for the proposed anesthesia with the patient or authorized representative who has indicated his/her understanding and acceptance.     Dental advisory given  Plan Discussed with: CRNA  Anesthesia Plan Comments:          Anesthesia Quick Evaluation

## 2018-12-26 ENCOUNTER — Encounter (HOSPITAL_COMMUNITY)
Admission: RE | Disposition: A | Discharge status: Home / Self Care | Payer: Self-pay | Source: Other Acute Inpatient Hospital | Attending: Orthopedic Surgery

## 2018-12-26 ENCOUNTER — Inpatient Hospital Stay (HOSPITAL_COMMUNITY): Payer: No Typology Code available for payment source | Admitting: Anesthesiology

## 2018-12-26 ENCOUNTER — Other Ambulatory Visit: Payer: Self-pay

## 2018-12-26 ENCOUNTER — Encounter (HOSPITAL_COMMUNITY): Payer: Self-pay | Admitting: *Deleted

## 2018-12-26 ENCOUNTER — Inpatient Hospital Stay (HOSPITAL_COMMUNITY): Payer: No Typology Code available for payment source | Admitting: Physician Assistant

## 2018-12-26 ENCOUNTER — Inpatient Hospital Stay (HOSPITAL_COMMUNITY)
Admission: RE | Admit: 2018-12-26 | Discharge: 2018-12-27 | DRG: 940 | Disposition: A | Discharge status: Home / Self Care | Payer: No Typology Code available for payment source | Attending: Orthopedic Surgery | Admitting: Orthopedic Surgery

## 2018-12-26 DIAGNOSIS — Y792 Prosthetic and other implants, materials and accessory orthopedic devices associated with adverse incidents: Secondary | ICD-10-CM | POA: Diagnosis present

## 2018-12-26 DIAGNOSIS — Y831 Surgical operation with implant of artificial internal device as the cause of abnormal reaction of the patient, or of later complication, without mention of misadventure at the time of the procedure: Secondary | ICD-10-CM | POA: Diagnosis present

## 2018-12-26 DIAGNOSIS — Z79899 Other long term (current) drug therapy: Secondary | ICD-10-CM

## 2018-12-26 DIAGNOSIS — T8454XD Infection and inflammatory reaction due to internal left knee prosthesis, subsequent encounter: Principal | ICD-10-CM

## 2018-12-26 DIAGNOSIS — Z6841 Body Mass Index (BMI) 40.0 and over, adult: Secondary | ICD-10-CM

## 2018-12-26 DIAGNOSIS — Z20828 Contact with and (suspected) exposure to other viral communicable diseases: Secondary | ICD-10-CM | POA: Diagnosis present

## 2018-12-26 DIAGNOSIS — K219 Gastro-esophageal reflux disease without esophagitis: Secondary | ICD-10-CM | POA: Diagnosis present

## 2018-12-26 DIAGNOSIS — Z96652 Presence of left artificial knee joint: Secondary | ICD-10-CM

## 2018-12-26 DIAGNOSIS — Z886 Allergy status to analgesic agent status: Secondary | ICD-10-CM

## 2018-12-26 DIAGNOSIS — Z8781 Personal history of (healed) traumatic fracture: Secondary | ICD-10-CM

## 2018-12-26 DIAGNOSIS — I10 Essential (primary) hypertension: Secondary | ICD-10-CM | POA: Diagnosis present

## 2018-12-26 DIAGNOSIS — G4730 Sleep apnea, unspecified: Secondary | ICD-10-CM | POA: Diagnosis not present

## 2018-12-26 DIAGNOSIS — F419 Anxiety disorder, unspecified: Secondary | ICD-10-CM | POA: Diagnosis present

## 2018-12-26 DIAGNOSIS — M96842 Postprocedural seroma of a musculoskeletal structure following a musculoskeletal system procedure: Secondary | ICD-10-CM | POA: Diagnosis not present

## 2018-12-26 HISTORY — PX: REIMPLANTATION OF TOTAL KNEE: SHX6052

## 2018-12-26 LAB — TYPE AND SCREEN
ABO/RH(D): A POS
Antibody Screen: NEGATIVE

## 2018-12-26 LAB — GLUCOSE, CAPILLARY: Glucose-Capillary: 120 mg/dL — ABNORMAL HIGH (ref 70–99)

## 2018-12-26 SURGERY — REVISION, TOTAL ARTHROPLASTY, KNEE
Anesthesia: Spinal | Site: Knee | Laterality: Left

## 2018-12-26 MED ORDER — ONDANSETRON HCL 4 MG/2ML IJ SOLN
INTRAMUSCULAR | Status: DC | PRN
  Administered 2018-12-26: 4 mg via INTRAVENOUS

## 2018-12-26 MED ORDER — MORPHINE SULFATE (PF) 2 MG/ML IV SOLN
0.5000 mg | INTRAVENOUS | Status: DC | PRN
  Administered 2018-12-26 (×4): 1 mg via INTRAVENOUS
  Filled 2018-12-26 (×4): qty 1

## 2018-12-26 MED ORDER — VERAPAMIL HCL ER 120 MG PO TBCR
120.0000 mg | EXTENDED_RELEASE_TABLET | Freq: Every day | ORAL | Status: DC
  Administered 2018-12-27: 10:00:00 120 mg via ORAL
  Filled 2018-12-26: qty 1

## 2018-12-26 MED ORDER — VANCOMYCIN HCL 1000 MG IV SOLR
INTRAVENOUS | Status: AC
  Filled 2018-12-26: qty 1000

## 2018-12-26 MED ORDER — MIDAZOLAM HCL 2 MG/2ML IJ SOLN
INTRAMUSCULAR | Status: AC
  Filled 2018-12-26: qty 2

## 2018-12-26 MED ORDER — BUPIVACAINE IN DEXTROSE 0.75-8.25 % IT SOLN
INTRATHECAL | Status: DC | PRN
  Administered 2018-12-26: 2 mL via INTRATHECAL

## 2018-12-26 MED ORDER — BISACODYL 10 MG RE SUPP: 10.0000 mg | Freq: Every day | RECTAL | Status: DC | PRN

## 2018-12-26 MED ORDER — PROPOFOL 10 MG/ML IV BOLUS
INTRAVENOUS | Status: AC
  Filled 2018-12-26: qty 20

## 2018-12-26 MED ORDER — SODIUM CHLORIDE (PF) 0.9 % IJ SOLN
INTRAMUSCULAR | Status: AC
  Filled 2018-12-26: qty 50

## 2018-12-26 MED ORDER — METHOCARBAMOL 500 MG IVPB - SIMPLE MED
500.0000 mg | Freq: Four times a day (QID) | INTRAVENOUS | Status: DC | PRN
  Administered 2018-12-26: 11:00:00 500 mg via INTRAVENOUS
  Filled 2018-12-26: qty 50

## 2018-12-26 MED ORDER — PROPOFOL 500 MG/50ML IV EMUL
INTRAVENOUS | Status: AC
  Filled 2018-12-26: qty 50

## 2018-12-26 MED ORDER — DOCUSATE SODIUM 100 MG PO CAPS
100.0000 mg | ORAL_CAPSULE | Freq: Two times a day (BID) | ORAL | Status: DC
  Administered 2018-12-26 – 2018-12-27 (×2): 100 mg via ORAL
  Filled 2018-12-26 (×3): qty 1

## 2018-12-26 MED ORDER — DEXAMETHASONE SODIUM PHOSPHATE 10 MG/ML IJ SOLN
INTRAMUSCULAR | Status: AC
  Filled 2018-12-26: qty 1

## 2018-12-26 MED ORDER — BUPIVACAINE HCL (PF) 0.25 % IJ SOLN
INTRAMUSCULAR | Status: AC
  Filled 2018-12-26: qty 30

## 2018-12-26 MED ORDER — HYDROXYZINE HCL 25 MG PO TABS
50.0000 mg | ORAL_TABLET | Freq: Every evening | ORAL | Status: DC | PRN
  Administered 2018-12-26: 50 mg via ORAL
  Filled 2018-12-26: qty 2

## 2018-12-26 MED ORDER — KETOROLAC TROMETHAMINE 30 MG/ML IJ SOLN
INTRAMUSCULAR | Status: AC
  Filled 2018-12-26: qty 1

## 2018-12-26 MED ORDER — PANTOPRAZOLE SODIUM 40 MG PO TBEC
40.0000 mg | DELAYED_RELEASE_TABLET | Freq: Every day | ORAL | Status: DC
  Administered 2018-12-27: 10:00:00 40 mg via ORAL
  Filled 2018-12-26: qty 1

## 2018-12-26 MED ORDER — PROPOFOL 10 MG/ML IV BOLUS
INTRAVENOUS | Status: DC | PRN
  Administered 2018-12-26: 30 mg via INTRAVENOUS

## 2018-12-26 MED ORDER — PHENOL 1.4 % MT LIQD: 1.0000 | OROMUCOSAL | Status: DC | PRN

## 2018-12-26 MED ORDER — LACTATED RINGERS IV SOLN
INTRAVENOUS | Status: DC
  Administered 2018-12-26 (×3): via INTRAVENOUS

## 2018-12-26 MED ORDER — LIDOCAINE 2% (20 MG/ML) 5 ML SYRINGE
INTRAMUSCULAR | Status: DC | PRN
  Administered 2018-12-26: 60 mg via INTRAVENOUS

## 2018-12-26 MED ORDER — BUPROPION HCL 100 MG PO TABS
100.0000 mg | ORAL_TABLET | Freq: Every day | ORAL | Status: DC
  Administered 2018-12-27: 10:00:00 100 mg via ORAL
  Filled 2018-12-26: qty 1

## 2018-12-26 MED ORDER — POVIDONE-IODINE 10 % EX SWAB
2.0000 "application " | Freq: Once | CUTANEOUS | Status: AC
  Administered 2018-12-26: 2 via TOPICAL

## 2018-12-26 MED ORDER — ENALAPRIL MALEATE 10 MG PO TABS
20.0000 mg | ORAL_TABLET | Freq: Every day | ORAL | Status: DC
  Administered 2018-12-27: 10:00:00 20 mg via ORAL
  Filled 2018-12-26: qty 2

## 2018-12-26 MED ORDER — CHLORHEXIDINE GLUCONATE 4 % EX LIQD: 60.0000 mL | Freq: Once | CUTANEOUS | Status: DC

## 2018-12-26 MED ORDER — METOCLOPRAMIDE HCL 5 MG PO TABS: 5.0000 mg | ORAL_TABLET | Freq: Three times a day (TID) | ORAL | Status: DC | PRN

## 2018-12-26 MED ORDER — ASPIRIN 81 MG PO CHEW
81.0000 mg | CHEWABLE_TABLET | Freq: Two times a day (BID) | ORAL | Status: DC
  Administered 2018-12-26 – 2018-12-27 (×2): 81 mg via ORAL
  Filled 2018-12-26 (×2): qty 1

## 2018-12-26 MED ORDER — METHOCARBAMOL 500 MG PO TABS
500.0000 mg | ORAL_TABLET | Freq: Four times a day (QID) | ORAL | Status: DC | PRN
  Administered 2018-12-26 – 2018-12-27 (×3): 500 mg via ORAL
  Filled 2018-12-26 (×3): qty 1

## 2018-12-26 MED ORDER — OXYCODONE HCL 5 MG PO TABS
10.0000 mg | ORAL_TABLET | ORAL | Status: DC | PRN
  Administered 2018-12-26: 5 mg via ORAL
  Administered 2018-12-26: 14:00:00 10 mg via ORAL
  Administered 2018-12-26 – 2018-12-27 (×4): 15 mg via ORAL
  Filled 2018-12-26 (×4): qty 3

## 2018-12-26 MED ORDER — MIDAZOLAM HCL 5 MG/5ML IJ SOLN
INTRAMUSCULAR | Status: DC | PRN
  Administered 2018-12-26 (×2): 1 mg via INTRAVENOUS

## 2018-12-26 MED ORDER — ALUM & MAG HYDROXIDE-SIMETH 200-200-20 MG/5ML PO SUSP: 15.0000 mL | ORAL | Status: DC | PRN

## 2018-12-26 MED ORDER — ACETAMINOPHEN 325 MG PO TABS: 325.0000 mg | ORAL_TABLET | Freq: Four times a day (QID) | ORAL | Status: DC | PRN

## 2018-12-26 MED ORDER — BUPIVACAINE HCL (PF) 0.25 % IJ SOLN
INTRAMUSCULAR | Status: DC | PRN
  Administered 2018-12-26: 30 mL via INTRA_ARTICULAR

## 2018-12-26 MED ORDER — HYDROCODONE-ACETAMINOPHEN 5-325 MG PO TABS: 1.0000 | ORAL_TABLET | ORAL | Status: DC | PRN

## 2018-12-26 MED ORDER — POLYETHYLENE GLYCOL 3350 17 G PO PACK
17.0000 g | PACK | Freq: Two times a day (BID) | ORAL | Status: DC
  Administered 2018-12-26: 15:00:00 17 g via ORAL
  Filled 2018-12-26 (×3): qty 1

## 2018-12-26 MED ORDER — BUPIVACAINE-EPINEPHRINE (PF) 0.5% -1:200000 IJ SOLN
INTRAMUSCULAR | Status: DC | PRN
  Administered 2018-12-26: 20 mL via PERINEURAL

## 2018-12-26 MED ORDER — TRAZODONE HCL 100 MG PO TABS
200.0000 mg | ORAL_TABLET | Freq: Every day | ORAL | Status: DC
  Administered 2018-12-26: 22:00:00 200 mg via ORAL
  Filled 2018-12-26: qty 2

## 2018-12-26 MED ORDER — HYDROCODONE-ACETAMINOPHEN 7.5-325 MG PO TABS: 1.0000 | ORAL_TABLET | ORAL | Status: DC | PRN

## 2018-12-26 MED ORDER — CELECOXIB 200 MG PO CAPS
200.0000 mg | ORAL_CAPSULE | Freq: Two times a day (BID) | ORAL | Status: DC
  Administered 2018-12-26 – 2018-12-27 (×3): 200 mg via ORAL
  Filled 2018-12-26 (×3): qty 1

## 2018-12-26 MED ORDER — ONDANSETRON HCL 4 MG PO TABS: 4.0000 mg | ORAL_TABLET | Freq: Four times a day (QID) | ORAL | Status: DC | PRN

## 2018-12-26 MED ORDER — LIDOCAINE 2% (20 MG/ML) 5 ML SYRINGE
INTRAMUSCULAR | Status: AC
  Filled 2018-12-26: qty 5

## 2018-12-26 MED ORDER — DEXAMETHASONE SODIUM PHOSPHATE 10 MG/ML IJ SOLN
10.0000 mg | Freq: Once | INTRAMUSCULAR | Status: AC
  Administered 2018-12-27: 09:00:00 10 mg via INTRAVENOUS
  Filled 2018-12-26: qty 1

## 2018-12-26 MED ORDER — FENTANYL CITRATE (PF) 100 MCG/2ML IJ SOLN
INTRAMUSCULAR | Status: DC | PRN
  Administered 2018-12-26 (×2): 50 ug via INTRAVENOUS

## 2018-12-26 MED ORDER — FENTANYL CITRATE (PF) 100 MCG/2ML IJ SOLN
INTRAMUSCULAR | Status: AC
  Filled 2018-12-26: qty 2

## 2018-12-26 MED ORDER — TRANEXAMIC ACID-NACL 1000-0.7 MG/100ML-% IV SOLN
1000.0000 mg | INTRAVENOUS | Status: AC
  Administered 2018-12-26: 08:00:00 1000 mg via INTRAVENOUS
  Filled 2018-12-26: qty 100

## 2018-12-26 MED ORDER — DEXAMETHASONE SODIUM PHOSPHATE 10 MG/ML IJ SOLN
10.0000 mg | Freq: Once | INTRAMUSCULAR | Status: AC
  Administered 2018-12-26: 08:00:00 10 mg via INTRAVENOUS

## 2018-12-26 MED ORDER — CEFAZOLIN SODIUM-DEXTROSE 2-4 GM/100ML-% IV SOLN
2.0000 g | Freq: Four times a day (QID) | INTRAVENOUS | Status: AC
  Administered 2018-12-26 (×2): 2 g via INTRAVENOUS
  Filled 2018-12-26 (×2): qty 100

## 2018-12-26 MED ORDER — PHENYLEPHRINE 40 MCG/ML (10ML) SYRINGE FOR IV PUSH (FOR BLOOD PRESSURE SUPPORT)
PREFILLED_SYRINGE | INTRAVENOUS | Status: AC
  Filled 2018-12-26: qty 10

## 2018-12-26 MED ORDER — METHOCARBAMOL 500 MG IVPB - SIMPLE MED
INTRAVENOUS | Status: AC
  Filled 2018-12-26: qty 50

## 2018-12-26 MED ORDER — FERROUS SULFATE 325 (65 FE) MG PO TABS
325.0000 mg | ORAL_TABLET | Freq: Two times a day (BID) | ORAL | Status: DC
  Administered 2018-12-26 – 2018-12-27 (×2): 325 mg via ORAL
  Filled 2018-12-26 (×2): qty 1

## 2018-12-26 MED ORDER — KETOROLAC TROMETHAMINE 30 MG/ML IJ SOLN
INTRAMUSCULAR | Status: DC | PRN
  Administered 2018-12-26: 30 mg via INTRA_ARTICULAR

## 2018-12-26 MED ORDER — VANCOMYCIN HCL 1 G IV SOLR
INTRAVENOUS | Status: DC | PRN
  Administered 2018-12-26: 2000 mg

## 2018-12-26 MED ORDER — ESCITALOPRAM OXALATE 20 MG PO TABS
20.0000 mg | ORAL_TABLET | Freq: Every day | ORAL | Status: DC
  Administered 2018-12-27: 10:00:00 20 mg via ORAL
  Filled 2018-12-26: qty 1

## 2018-12-26 MED ORDER — PROPOFOL 500 MG/50ML IV EMUL
INTRAVENOUS | Status: DC | PRN
  Administered 2018-12-26: 75 ug/kg/min via INTRAVENOUS

## 2018-12-26 MED ORDER — HYDROMORPHONE HCL 1 MG/ML IJ SOLN: 0.2500 mg | INTRAMUSCULAR | Status: DC | PRN

## 2018-12-26 MED ORDER — SODIUM CHLORIDE 0.9 % IR SOLN
Status: DC | PRN
  Administered 2018-12-26: 5000 mL

## 2018-12-26 MED ORDER — DIPHENHYDRAMINE HCL 12.5 MG/5ML PO ELIX: 12.5000 mg | ORAL_SOLUTION | ORAL | Status: DC | PRN

## 2018-12-26 MED ORDER — TOBRAMYCIN SULFATE 1.2 G IJ SOLR
INTRAMUSCULAR | Status: AC
  Filled 2018-12-26: qty 2.4

## 2018-12-26 MED ORDER — PHENYLEPHRINE 40 MCG/ML (10ML) SYRINGE FOR IV PUSH (FOR BLOOD PRESSURE SUPPORT)
PREFILLED_SYRINGE | INTRAVENOUS | Status: DC | PRN
  Administered 2018-12-26 (×6): 80 ug via INTRAVENOUS
  Administered 2018-12-26: 40 ug via INTRAVENOUS
  Administered 2018-12-26: 80 ug via INTRAVENOUS

## 2018-12-26 MED ORDER — STERILE WATER FOR IRRIGATION IR SOLN
Status: DC | PRN
  Administered 2018-12-26: 2000 mL

## 2018-12-26 MED ORDER — SODIUM CHLORIDE (PF) 0.9 % IJ SOLN
INTRAMUSCULAR | Status: DC | PRN
  Administered 2018-12-26: 30 mL

## 2018-12-26 MED ORDER — MAGNESIUM CITRATE PO SOLN: 1.0000 | Freq: Once | ORAL | Status: DC | PRN

## 2018-12-26 MED ORDER — 0.9 % SODIUM CHLORIDE (POUR BTL) OPTIME
TOPICAL | Status: DC | PRN
  Administered 2018-12-26: 1000 mL

## 2018-12-26 MED ORDER — SODIUM CHLORIDE 0.9 % IV SOLN
INTRAVENOUS | Status: DC
  Administered 2018-12-26 – 2018-12-27 (×2): via INTRAVENOUS

## 2018-12-26 MED ORDER — METOPROLOL TARTRATE 50 MG PO TABS
100.0000 mg | ORAL_TABLET | Freq: Two times a day (BID) | ORAL | Status: DC
  Administered 2018-12-27: 100 mg via ORAL
  Filled 2018-12-26: qty 2

## 2018-12-26 MED ORDER — OXYCODONE HCL 5 MG PO TABS
5.0000 mg | ORAL_TABLET | ORAL | Status: DC | PRN
  Filled 2018-12-26: qty 2
  Filled 2018-12-26: qty 1

## 2018-12-26 MED ORDER — ONDANSETRON HCL 4 MG/2ML IJ SOLN
INTRAMUSCULAR | Status: AC
  Filled 2018-12-26: qty 2

## 2018-12-26 MED ORDER — MENTHOL 3 MG MT LOZG: 1.0000 | LOZENGE | OROMUCOSAL | Status: DC | PRN

## 2018-12-26 MED ORDER — ONDANSETRON HCL 4 MG/2ML IJ SOLN: 4.0000 mg | Freq: Four times a day (QID) | INTRAMUSCULAR | Status: DC | PRN

## 2018-12-26 MED ORDER — TRANEXAMIC ACID-NACL 1000-0.7 MG/100ML-% IV SOLN
1000.0000 mg | Freq: Once | INTRAVENOUS | Status: AC
  Administered 2018-12-26: 12:00:00 1000 mg via INTRAVENOUS
  Filled 2018-12-26: qty 100

## 2018-12-26 MED ORDER — METOCLOPRAMIDE HCL 5 MG/ML IJ SOLN: 5.0000 mg | Freq: Three times a day (TID) | INTRAMUSCULAR | Status: DC | PRN

## 2018-12-26 SURGICAL SUPPLY — 81 items
ATTUNE PSRP INSR SZ8 12 KNEE (Insert) ×2 IMPLANT
ATTUNE PSRP INSR SZ8 12MMKNEE (Insert) ×1 IMPLANT
ATUNE TIB SLV M/L 53 ×3 IMPLANT
AUG POST FEM KNEE SZ8 8 (Miscellaneous) ×3 IMPLANT
AUGMENT DISTAL FEM SZ8 4  KNEE (Miscellaneous) ×4 IMPLANT
AUGMENT DISTAL FEM SZ8 4 KNEE (Miscellaneous) ×2 IMPLANT
AUGMENT POST FEM KNEE SZ8 8 (Miscellaneous) ×1 IMPLANT
AUGMENT POST FEM SZ8 4 KNEE (Miscellaneous) ×3 IMPLANT
BAG ZIPLOCK 12X15 (MISCELLANEOUS) ×3 IMPLANT
BANDAGE ESMARK 6X9 LF (GAUZE/BANDAGES/DRESSINGS) ×1 IMPLANT
BASE TIB KNEE REV RP ATUNE SZ6 (Knees) ×3 IMPLANT
BLADE SAW SGTL 11.0X1.19X90.0M (BLADE) IMPLANT
BLADE SAW SGTL 13.0X1.19X90.0M (BLADE) ×3 IMPLANT
BLADE SAW SGTL 81X20 HD (BLADE) ×6 IMPLANT
BNDG ELASTIC 6X5.8 VLCR STR LF (GAUZE/BANDAGES/DRESSINGS) ×3 IMPLANT
BNDG ESMARK 6X9 LF (GAUZE/BANDAGES/DRESSINGS) ×3
BRUSH FEMORAL CANAL (MISCELLANEOUS) ×3 IMPLANT
CEMENT HV SMART SET (Cement) ×6 IMPLANT
CEMENT RESTRICTOR DEPUY SZ 6 (Cement) ×3 IMPLANT
COMP FEM ATTUNE CRS SZ8 LT (Femur) ×3 IMPLANT
COMPONENT FEM ATN CRS SZ8 LT (Femur) ×1 IMPLANT
COVER SURGICAL LIGHT HANDLE (MISCELLANEOUS) ×3 IMPLANT
COVER WAND RF STERILE (DRAPES) IMPLANT
CUFF TOURN SGL QUICK 34 (TOURNIQUET CUFF) ×2
CUFF TRNQT CYL 34X4.125X (TOURNIQUET CUFF) ×1 IMPLANT
DERMABOND ADVANCED (GAUZE/BANDAGES/DRESSINGS) ×2
DERMABOND ADVANCED .7 DNX12 (GAUZE/BANDAGES/DRESSINGS) ×1 IMPLANT
DRAPE POUCH INSTRU U-SHP 10X18 (DRAPES) ×3 IMPLANT
DRAPE SHEET LG 3/4 BI-LAMINATE (DRAPES) ×6 IMPLANT
DRAPE U-SHAPE 47X51 STRL (DRAPES) ×6 IMPLANT
DRESSING AQUACEL AG SP 3.5X10 (GAUZE/BANDAGES/DRESSINGS) IMPLANT
DRSG AQUACEL AG ADV 3.5X10 (GAUZE/BANDAGES/DRESSINGS) ×3 IMPLANT
DRSG AQUACEL AG ADV 3.5X14 (GAUZE/BANDAGES/DRESSINGS) ×3 IMPLANT
DRSG AQUACEL AG SP 3.5X10 (GAUZE/BANDAGES/DRESSINGS)
DRSG PAD ABDOMINAL 8X10 ST (GAUZE/BANDAGES/DRESSINGS) ×3 IMPLANT
DURAPREP 26ML APPLICATOR (WOUND CARE) ×3 IMPLANT
ELECT REM PT RETURN 15FT ADLT (MISCELLANEOUS) ×3 IMPLANT
FACESHIELD WRAPAROUND (MASK) ×15 IMPLANT
GAUZE SPONGE 4X4 12PLY STRL (GAUZE/BANDAGES/DRESSINGS) ×6 IMPLANT
GAUZE XEROFORM 5X9 LF (GAUZE/BANDAGES/DRESSINGS) ×3 IMPLANT
GLOVE BIOGEL M 7.0 STRL (GLOVE) IMPLANT
GLOVE BIOGEL PI IND STRL 7.5 (GLOVE) ×2 IMPLANT
GLOVE BIOGEL PI IND STRL 8.5 (GLOVE) ×2 IMPLANT
GLOVE BIOGEL PI INDICATOR 7.5 (GLOVE) ×4
GLOVE BIOGEL PI INDICATOR 8.5 (GLOVE) ×4
GLOVE ECLIPSE 8.0 STRL XLNG CF (GLOVE) ×6 IMPLANT
GLOVE ORTHO TXT STRL SZ7.5 (GLOVE) ×6 IMPLANT
GOWN SRG XL LVL 4 BRTHBL STRL (GOWNS) ×2 IMPLANT
GOWN STRL NON-REIN XL LVL4 (GOWNS) ×4
GOWN STRL REUS W/TWL LRG LVL3 (GOWN DISPOSABLE) ×6 IMPLANT
GOWN STRL REUS W/TWL XL LVL3 (GOWN DISPOSABLE) ×3 IMPLANT
HANDPIECE INTERPULSE COAX TIP (DISPOSABLE) ×2
HOLDER FOLEY CATH W/STRAP (MISCELLANEOUS) ×3 IMPLANT
KIT TURNOVER KIT A (KITS) IMPLANT
MANIFOLD NEPTUNE II (INSTRUMENTS) ×3 IMPLANT
NDL SAFETY ECLIPSE 18X1.5 (NEEDLE) ×1 IMPLANT
NEEDLE HYPO 18GX1.5 SHARP (NEEDLE) ×2
NS IRRIG 1000ML POUR BTL (IV SOLUTION) ×3 IMPLANT
PADDING CAST COTTON 6X4 STRL (CAST SUPPLIES) ×6 IMPLANT
PIN FIX SIGMA LCS THRD HI (PIN) ×3 IMPLANT
PROTECTOR NERVE ULNAR (MISCELLANEOUS) ×3 IMPLANT
SET HNDPC FAN SPRY TIP SCT (DISPOSABLE) ×1 IMPLANT
SET PAD KNEE POSITIONER (MISCELLANEOUS) ×3 IMPLANT
SLEEVE ATTUNE TIB M/L 53 ×1 IMPLANT
SPONGE LAP 18X18 RF (DISPOSABLE) ×9 IMPLANT
STEM CEMT ATTUNE 14X80 (Knees) ×3 IMPLANT
STEM STR ATTUNE PF 14X60 (Knees) ×3 IMPLANT
SUCTION FRAZIER HANDLE 12FR (TUBING) ×2
SUCTION TUBE FRAZIER 12FR DISP (TUBING) ×1 IMPLANT
SUT MNCRL AB 3-0 PS2 18 (SUTURE) ×3 IMPLANT
SUT VIC AB 1 CT1 36 (SUTURE) ×15 IMPLANT
SUT VIC AB 2-0 CT1 27 (SUTURE) ×6
SUT VIC AB 2-0 CT1 TAPERPNT 27 (SUTURE) ×3 IMPLANT
SYR 3ML LL SCALE MARK (SYRINGE) ×3 IMPLANT
SYR 50ML LL SCALE MARK (SYRINGE) ×3 IMPLANT
TOWEL OR 17X26 10 PK STRL BLUE (TOWEL DISPOSABLE) ×3 IMPLANT
TOWER CARTRIDGE SMART MIX (DISPOSABLE) ×3 IMPLANT
TRAY FOLEY MTR SLVR 16FR STAT (SET/KITS/TRAYS/PACK) ×3 IMPLANT
WATER STERILE IRR 1000ML POUR (IV SOLUTION) ×6 IMPLANT
WRAP KNEE MAXI GEL POST OP (GAUZE/BANDAGES/DRESSINGS) ×3 IMPLANT
YANKAUER SUCT BULB TIP 10FT TU (MISCELLANEOUS) IMPLANT

## 2018-12-26 NOTE — Interval H&P Note (Signed)
History and Physical Interval Note:  12/26/2018 7:05 AM  Thomas [name]  has presented today for surgery, with the diagnosis of Status post resection left total knee and placement of antibiotic spacer.  The various methods of treatment have been discussed with the patient and family. After consideration of risks, benefits and other options for treatment, the patient has consented to  Procedure(s) with comments: REIMPLANTATION OF TOTAL KNEE (Left) - 90 mins as a surgical intervention.  The patient's history has been reviewed, patient examined, no change in status, stable for surgery.  I have reviewed the patient's chart and labs.  Questions were answered to the patient's satisfaction.     Mauri Pole

## 2018-12-26 NOTE — Transfer of Care (Signed)
Immediate Anesthesia Transfer of Care Note  Patient: Aviva Signs  Procedure(s) Performed: REIMPLANTATION OF TOTAL KNEE (Left Knee)  Patient Location: PACU  Anesthesia Type:Spinal  Level of Consciousness: awake, alert  and oriented  Airway & Oxygen Therapy: Patient Spontanous Breathing and Patient connected to face mask oxygen  Post-op Assessment: Report given to RN and Post -op Vital signs reviewed and stable  Post vital signs: Reviewed and stable  Last Vitals:  Vitals Value Taken Time  BP 101/63 12/26/18 1037  Temp    Pulse 62 12/26/18 1038  Resp 19 12/26/18 1038  SpO2 97 % 12/26/18 1038  Vitals shown include unvalidated device data.  Last Pain:  Vitals:   12/26/18 0610  TempSrc: Oral  PainSc:       Patients Stated Pain Goal: 5 (83/15/17 6160)  Complications: No apparent anesthesia complications

## 2018-12-26 NOTE — Brief Op Note (Signed)
12/26/2018  9:58 AM  PATIENT:  Aviva Signs  48 y.o. male  PRE-OPERATIVE DIAGNOSIS:  Status post resection left total knee and placement of antibiotic spacer  POST-OPERATIVE DIAGNOSIS:  Status post resection left total knee and placement of antibiotic  PROCEDURE:  Procedure(s) with comments: REIMPLANTATION OF TOTAL KNEE (Left) - 90 mins  SURGEON:  Surgeon(s) and Role:    Paralee Cancel, MD - Primary  PHYSICIAN ASSISTANT: Danae Orleans, PA-C  ANESTHESIA:   regional and spinal  EBL:  200 mL   BLOOD ADMINISTERED:none  DRAINS: none   LOCAL MEDICATIONS USED:  MARCAINE     SPECIMEN:  No Specimen  DISPOSITION OF SPECIMEN:  N/A  COUNTS:  YES  TOURNIQUET:  92 min at 242mmHg  DICTATION: .Other Dictation: Dictation Number [id number]  PLAN OF CARE: Admit to inpatient   PATIENT DISPOSITION:  PACU - hemodynamically stable.   Delay start of Pharmacological VTE agent (>24hrs) due to surgical blood loss or risk of bleeding: no

## 2018-12-26 NOTE — Evaluation (Signed)
Physical Therapy Evaluation Patient Details Name: Thomas [name] MRN: [id number] DOB: 08/11/1970 Today's Date: 12/26/2018   History of Present Illness  Pt is a 48 y.o. male with hx of  L TKA resection with placement of ABX spacer 3 months ago and currently admitted for reimplanation of left total knee.  Clinical Impression  Patient is s/p above surgery resulting in functional limitations due to the deficits listed below (see PT Problem List).  Patient will benefit from skilled PT to increase their independence and safety with mobility to allow discharge to the venue listed below.  Pt assisted with ambulating short distance in hallway POD #0 and plans to d/c home hopefully tomorrow.     Follow Up Recommendations Follow surgeon's recommendation for DC plan and follow-up therapies    Equipment Recommendations  None recommended by PT    Recommendations for Other Services       Precautions / Restrictions Precautions Precautions: Knee;Fall Restrictions Weight Bearing Restrictions: No Other Position/Activity Restrictions: WBAT      Mobility  Bed Mobility Overal bed mobility: Needs Assistance Bed Mobility: Supine to Sit;Sit to Supine     Supine to sit: Supervision Sit to supine: Supervision      Transfers Overall transfer level: Needs assistance Equipment used: Rolling walker (2 wheeled) Transfers: Sit to/from Stand Sit to Stand: Min guard         General transfer comment: verbal cues for UE and LE positioning  Ambulation/Gait Ambulation/Gait assistance: Min guard Gait Distance (Feet): 60 Feet Assistive device: Rolling walker (2 wheeled) Gait Pattern/deviations: Step-to pattern;Decreased stance time - left;Antalgic     General Gait Details: verbal cues for sequence, RW positioning, knee flexion  Stairs            Wheelchair Mobility    Modified Rankin (Stroke Patients Only)       Balance                                              Pertinent Vitals/Pain Pain Assessment: 0-10 Pain Score: 8  Pain Location: left knee Pain Descriptors / Indicators: Aching;Sore Pain Intervention(s): Monitored during session;Repositioned;Premedicated before session(declined ice, RN aware of pain, pt has received meds)    Home Living Family/patient expects to be discharged to:: Private residence Living Arrangements: Spouse/significant other   Type of Home: House Home Access: Stairs to enter Entrance Stairs-Rails: Right Entrance Stairs-Number of Steps: 2 Home Layout: One [place]: [place] - 2 wheels;Cane - single point;Crutches;Bedside commode;Shower seat      Prior Function Level of Independence: Independent               Hand Dominance        Extremity/Trunk Assessment        Lower Extremity Assessment Lower Extremity Assessment: LLE deficits/detail LLE Deficits / Details: pt reports he was able to perform knee flexion well prior to surgery, observed at least 64* AAROM left knee flexion at edge of bed, pt able to perform SLR       Communication   Communication: No difficulties  Cognition Arousal/Alertness: Awake/alert Behavior During Therapy: WFL for tasks assessed/performed Overall Cognitive Status: Within Functional Limits for tasks assessed  General Comments      Exercises     Assessment/Plan    PT Assessment Patient needs continued PT services  PT Problem List Decreased strength;Decreased mobility;Decreased activity tolerance;Pain;Decreased range of motion       PT Treatment Interventions Stair training;Gait training;DME instruction;Therapeutic exercise;Functional mobility training;Therapeutic activities;Patient/family education    PT Goals (Current goals can be found in the Care Plan section)  Acute Rehab PT Goals PT Goal Formulation: With patient Time For Goal Achievement: 12/30/18 Potential to Achieve Goals: Good     Frequency 7X/week   Barriers to discharge        Co-evaluation               AM-PAC PT "6 Clicks" Mobility  Outcome Measure Help needed turning from your back to your side while in a flat bed without using bedrails?: A Little Help needed moving from lying on your back to sitting on the side of a flat bed without using bedrails?: A Little Help needed moving to and from a bed to a chair (including a wheelchair)?: A Little Help needed standing up from a chair using your arms (e.g., wheelchair or bedside chair)?: A Little Help needed to walk in hospital room?: A Little Help needed climbing 3-5 steps with a railing? : A Little 6 Click Score: 18    End of Session Equipment Utilized During Treatment: Gait belt Activity Tolerance: Patient tolerated treatment well Patient left: in bed;with call bell/phone within reach;with bed alarm set Nurse Communication: Mobility status;Patient requests pain meds PT Visit Diagnosis: Other abnormalities of gait and mobility (R26.89)    Time: 5462-7035 PT Time Calculation (min) (ACUTE ONLY): 17 min   Charges:   PT Evaluation $PT Eval Low Complexity: 1 Low     Zenovia Jarred, PT, DPT Acute Rehabilitation Services Office: [phone] Pager: [phone]  Sarajane Jews 12/26/2018, 4:12 PM

## 2018-12-26 NOTE — Anesthesia Procedure Notes (Signed)
Spinal  Patient location during procedure: OR Start time: 12/26/2018 7:16 AM End time: 12/26/2018 7:21 AM Staffing Anesthesiologist: Roderic Palau, MD Performed: anesthesiologist  Preanesthetic Checklist Completed: patient identified, surgical consent, pre-op evaluation, timeout performed, IV checked, risks and benefits discussed and monitors and equipment checked Spinal Block Patient position: sitting Prep: DuraPrep Patient monitoring: cardiac monitor, continuous pulse ox and blood pressure Approach: midline Location: L3-4 Injection technique: single-shot Needle Needle type: Pencan  Needle gauge: 24 G Needle length: 9 cm Assessment Sensory level: T8 Events: paresthesia Additional Notes Functioning IV was confirmed and monitors were applied. Sterile prep and drape, including hand hygiene and sterile gloves were used. The patient was positioned and the spine was prepped. The skin was anesthetized with lidocaine.  Free flow of clear CSF was obtained prior to injecting local anesthetic into the CSF.  The spinal needle aspirated freely following injection.  The needle was carefully withdrawn.  The patient tolerated the procedure well. Patient had a parasthesia with needle placement that was temporary. CSF flowed freely upon removal of the stylet. There was no pain on aspiration or injection of local anesthetic at the beginning or the end of the injection.

## 2018-12-26 NOTE — Anesthesia Postprocedure Evaluation (Signed)
Anesthesia Post Note  Patient: Aviva Signs  Procedure(s) Performed: REIMPLANTATION OF TOTAL KNEE (Left Knee)     Patient location during evaluation: PACU Anesthesia Type: Spinal and Regional Level of consciousness: oriented and awake and alert Pain management: pain level controlled Vital Signs Assessment: post-procedure vital signs reviewed and stable Respiratory status: spontaneous breathing, respiratory function stable and patient connected to nasal cannula oxygen Cardiovascular status: blood pressure returned to baseline and stable Postop Assessment: no headache, no backache, no apparent nausea or vomiting, patient able to bend at knees and spinal receding Anesthetic complications: no    Last Vitals:  Vitals:   12/26/18 1100 12/26/18 1115  BP: 108/69 110/60  Pulse: (!) 58 (!) 56  Resp: 13 10  Temp:  (!) 36.3 C  SpO2: 96% 98%    Last Pain:  Vitals:   12/26/18 1122  TempSrc:   PainSc: 4                  [place],W. EDMOND

## 2018-12-26 NOTE — Anesthesia Procedure Notes (Signed)
Date/Time: 12/26/2018 7:21 AM Performed by: Talbot Grumbling, CRNA Oxygen Delivery Method: Simple face mask

## 2018-12-26 NOTE — Anesthesia Procedure Notes (Signed)
Anesthesia Regional Block: Adductor canal block   Pre-Anesthetic Checklist: ,, timeout performed, Correct Patient, Correct Site, Correct Laterality, Correct Procedure, Correct Position, site marked, Risks and benefits discussed, pre-op evaluation,  At surgeon's request and post-op pain management  Laterality: Left  Prep: Maximum Sterile Barrier Precautions used, chloraprep       Needles:  Injection technique: Single-shot  Needle Type: Echogenic Stimulator Needle     Needle Length: 9cm  Needle Gauge: 21     Additional Needles:   Procedures:,,,, ultrasound used (permanent image in chart),,,,  Narrative:  Start time: 12/26/2018 6:50 AM End time: 12/26/2018 7:00 AM Injection made incrementally with aspirations every 5 mL.  Performed by: Personally  Anesthesiologist: Roderic Palau, MD  Additional Notes: 2% Lidocaine skin wheel.

## 2018-12-27 LAB — BASIC METABOLIC PANEL
Anion gap: 8 (ref 5–15)
BUN: 10 mg/dL (ref 6–20)
CO2: 25 mmol/L (ref 22–32)
Calcium: 8.4 mg/dL — ABNORMAL LOW (ref 8.9–10.3)
Chloride: 103 mmol/L (ref 98–111)
Creatinine, Ser: 1.04 mg/dL (ref 0.61–1.24)
GFR calc Af Amer: >60 mL/min (ref >60)
GFR calc non Af Amer: >60 mL/min (ref >60)
Glucose, Bld: 201 mg/dL — ABNORMAL HIGH (ref 70–99)
Potassium: 4.3 mmol/L (ref 3.5–5.1)
Sodium: 136 mmol/L (ref 135–145)

## 2018-12-27 LAB — CBC
HCT: 34.4 % — ABNORMAL LOW (ref 39.0–52.0)
Hemoglobin: 11 g/dL — ABNORMAL LOW (ref 13.0–17.0)
MCH: 25.4 pg — ABNORMAL LOW (ref 26.0–34.0)
MCHC: 32 g/dL (ref 30.0–36.0)
MCV: 79.4 fL — ABNORMAL LOW (ref 80.0–100.0)
Platelets: 176 10*3/uL (ref 150–400)
RBC: 4.33 MIL/uL (ref 4.22–5.81)
RDW: 15 % (ref 11.5–15.5)
WBC: 12.7 10*3/uL — ABNORMAL HIGH (ref 4.0–10.5)
nRBC: 0 % (ref 0.0–0.2)

## 2018-12-27 MED ORDER — OXYCODONE HCL 10 MG PO TABS: 10.0000 mg | ORAL_TABLET | ORAL | 0 refills | Status: DC | PRN

## 2018-12-27 MED ORDER — CEPHALEXIN 500 MG PO CAPS: 500.0000 mg | ORAL_CAPSULE | Freq: Three times a day (TID) | ORAL | 0 refills | Status: AC

## 2018-12-27 MED ORDER — METHOCARBAMOL 500 MG PO TABS: 500.0000 mg | ORAL_TABLET | Freq: Four times a day (QID) | ORAL | 0 refills | Status: DC | PRN

## 2018-12-27 MED ORDER — FERROUS SULFATE 325 (65 FE) MG PO TABS: 325.0000 mg | ORAL_TABLET | Freq: Three times a day (TID) | ORAL | 0 refills | Status: DC

## 2018-12-27 MED ORDER — DOCUSATE SODIUM 100 MG PO CAPS: 100.0000 mg | ORAL_CAPSULE | Freq: Two times a day (BID) | ORAL | 0 refills | Status: DC

## 2018-12-27 MED ORDER — ACETAMINOPHEN 500 MG PO TABS: 1000.0000 mg | ORAL_TABLET | Freq: Three times a day (TID) | ORAL | 0 refills | Status: DC

## 2018-12-27 MED ORDER — ASPIRIN 81 MG PO CHEW: 81.0000 mg | CHEWABLE_TABLET | Freq: Two times a day (BID) | ORAL | 0 refills | Status: AC

## 2018-12-27 MED ORDER — POLYETHYLENE GLYCOL 3350 17 G PO PACK: 17.0000 g | PACK | Freq: Two times a day (BID) | ORAL | 0 refills | Status: DC

## 2018-12-27 NOTE — Op Note (Signed)
NAMENarvel [name], Thomas [name] MEDICAL RECORD [id number] ACCOUNT [id number] DATE OF BIRTH:04-27-1970 FACILITY: [facility] LOCATION: [facility] PHYSICIAN:Jedrick Hutcherson D. 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  PROCEDURE:  Removal of antibiotic spacer and reimplantation of left total knee replacement.  COMPONENTS USED:  A DePuy Attune revision knee system with a size 8 left Attune revision femur, 8 mm posterior medial augments, 4 mm posterior lateral augment, 4 mm distal medial and lateral augments, with a 14 x 80 mm cemented stem.  On the tibia side,  it was a size 6 MBT Attune revision tray with a 53 fully porous coated sleeve with a 14 x 60 press-fit stem and a 12 mm rotating platform posterior-stabilized insert to match the size 8 femur.  SURGEON:  Paralee Cancel, MD  ASSISTANT:  Danae Orleans, PA-C.  Note that Mr. Thomas [name] was present for the entirety of the case from preoperative positioning, perioperative management of the operative extremity, general facilitation of case and primary wound closure.  ANESTHESIA:  Regional plus spinal.  SPECIMENS:  None taken.  BLOOD LOSS:  Around 200 mL.  TOURNIQUET:  Up for 90 minutes at 250 mmHg.  DRAINS:  None.  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  He underwent a partial knee arthroplasty,  complicated by medial fracture requiring open reduction internal fixation.  This ultimately failed requiring revision to a total knee replacement.  This subsequently failed due to infection.  He is now status post resection of infected total knee   arthroplasty, placement of antibiotic spacer.  Followed clinically.  He underwent 6 weeks of IV antibiotics and after 6 weeks of antibiotics his knee wound had significantly improved with no signs of infection; clinically, his sedimentation rate and  C-reactive protein had normalized.  He was at this point clinically prepared for reimplantation.  Risks of recurrent infection discussed.  Risk of DVT, component failure, need for future surgeries were all discussed stressing that his weight loss  continuing to provide stability to his knee in healing.  Consent was obtained for benefit of pain relief.  PROCEDURE DETAIL:  The patient was brought to the operative theater.  Once adequate anesthesia, preoperative antibiotics, Ancef administered, he was positioned supine with a left thigh tourniquet placed.  The left lower extremity was then prepped and  draped in sterile fashion.  A timeout was performed identifying the patient, the planned procedure, and extremity.  His old incision was excised.  Soft tissue plane was created.  He was noted to have some old blood in the prepatellar region as a seroma  and broken down hematoma.  Once I made an arthrotomy, encountered similar type fluid.  There were no signs of purulence.  No signs of infection, just old blood.  After opening up his knee, attention was first directed at exposure including significant  medial synovectomy and scar debridement as well as in the parapatellar region laterally and suprapatellar.  Once I was able to get the knee fully exposed in this manner, we removed the femoral and tibial cemented articulating spacer out of the knee  without bone loss.  At this point, attention was to preparation of the cut surfaces for  reimplantation.  This was done per protocol for his knee system.  With the tibia trial in place which included a 53 mm porous trial sleeve and stem, we used this as a  basis  for rotation of his femoral component.  The femoral  component and the femur were prepared.  Trial reduction initially revealed the need for augments as noted in his final components.  Once I was satisfied with the trial reductions with the  components in place, the final components were selected and configured on the back table.  The tibia side was press-fit only.  The femoral side was cemented into place to allow for placement of some antibiotics in the cement.  Once those components were  configured, we mixed 2 batches of cement with vancomycin.  The tibia side was impacted into place where the broach was and the femoral component was cemented in place.  The knee was brought to extension with a 12 mm insert.  Once the cement fully cured,  excessive cement was removed from throughout the knee.  I assessed the range of motion and was satisfied with the tracking of the patella as well as his extension and flexion.  The final 12 mm insert was opened and placed into the tibia.  The knee was  irrigated throughout the case and again at the end of the case.  The extensor mechanism was reapproximated using a combination of #1 Vicryl and #1 Stratafix suture.  The remainder of the wound was closed with 2-0 Vicryl and a running Monocryl stitch.   The knee was cleaned, dried and dressed sterilely using surgical glue and Aquacel dressing.  He was transferred to the recovery room in stable condition.  We will likely limit some of his weightbearing based on the press-fit nature of the components  until after bony healing.  Will stress range of motion and effort with therapy from that standpoint.  We will see him back in the office in 2 weeks after his hospital stay.  CN/NUANCE  D:12/27/2018 T:12/27/2018 JOB:[id number]

## 2018-12-27 NOTE — Progress Notes (Signed)
Physical Therapy Treatment Patient Details Name: Thomas [name] MRN: [id number] DOB: 1970-07-18 Today's Date: 12/27/2018    History of Present Illness Pt is a 48 y.o. male with hx of  L TKA resection with placement of ABX spacer 3 months ago and currently admitted for reimplanation of left total knee.    PT Comments    Pt ambulated in hallway and practiced safe stair technique.  Pt also performed LE exercises and provided with HEP handout.  Pt feels ready for d/c home today.   Follow Up Recommendations  Follow surgeon's recommendation for DC plan and follow-up therapies     Equipment Recommendations  None recommended by PT    Recommendations for Other Services       Precautions / Restrictions Precautions Precautions: Knee;Fall Restrictions Weight Bearing Restrictions: Yes LLE Weight Bearing: Partial weight bearing    Mobility  Bed Mobility Overal bed mobility: Needs Assistance Bed Mobility: Supine to Sit     Supine to sit: Supervision        Transfers Overall transfer level: Needs assistance Equipment used: Rolling walker (2 wheeled) Transfers: Sit to/from Stand Sit to Stand: Supervision         General transfer comment: verbal cues for UE and LE positioning  Ambulation/Gait Ambulation/Gait assistance: Supervision Gait Distance (Feet): 180 Feet Assistive device: Rolling walker (2 wheeled) Gait Pattern/deviations: Step-to pattern;Decreased stance time - left;Antalgic     General Gait Details: verbal cues for sequence, RW positioning, knee flexion; aware to perform PWB   Stairs Stairs: Yes Stairs assistance: Min guard Stair Management: Step to pattern;Forwards;One rail Left;With crutches Number of Stairs: 2 General stair comments: pt performed with crutch and rail; cues for sequence and safety, performed twice   Wheelchair Mobility    Modified Rankin (Stroke Patients Only)       Balance                                            Cognition Arousal/Alertness: Awake/alert Behavior During Therapy: WFL for tasks assessed/performed Overall Cognitive Status: Within Functional Limits for tasks assessed                                        Exercises Total Joint Exercises Ankle Circles/Pumps: AROM;Both;10 reps Quad Sets: AROM;Both;10 reps Short Arc Quad: AROM;Left;10 reps Heel Slides: AAROM;Left;10 reps Hip ABduction/ADduction: Left;10 reps;AROM Straight Leg Raises: AROM;Left;10 reps    General Comments        Pertinent Vitals/Pain Pain Assessment: 0-10 Pain Score: 7  Pain Location: left knee Pain Descriptors / Indicators: Aching;Sore Pain Intervention(s): Repositioned;Monitored during session;Premedicated before session    Home Living                      Prior Function            PT Goals (current goals can now be found in the care plan section) Progress towards PT goals: Progressing toward goals    Frequency    7X/week      PT Plan Current plan remains appropriate    Co-evaluation              AM-PAC PT "6 Clicks" Mobility   Outcome Measure  Help needed turning from your back to your side while in a flat bed  without using bedrails?: A Little Help needed moving from lying on your back to sitting on the side of a flat bed without using bedrails?: A Little Help needed moving to and from a bed to a chair (including a wheelchair)?: A Little Help needed standing up from a chair using your arms (e.g., wheelchair or bedside chair)?: A Little Help needed to walk in hospital room?: A Little Help needed climbing 3-5 steps with a railing? : A Little 6 Click Score: 18    End of Session   Activity Tolerance: Patient tolerated treatment well Patient left: in bed;with call bell/phone within reach   PT Visit Diagnosis: Other abnormalities of gait and mobility (R26.89)     Time: 9774-1423 PT Time Calculation (min) (ACUTE ONLY): 23 min  Charges:  $Gait  Training: 8-22 mins $Therapeutic Exercise: 8-22 mins                     Thomas [name], PT, DPT Acute Rehabilitation Services Office: [phone] Pager: [phone]  Thomas [name] 12/27/2018, 1:05 PM

## 2018-12-27 NOTE — Progress Notes (Addendum)
     Subjective: 1 Day Post-Op Procedure(s) (LRB): REIMPLANTATION OF TOTAL KNEE (Left)   Seen by Dr. Alvan Dame. Patient reports pain as mild, pain controlled.  No events throughout the night.  Dr. Alvan Dame discussed the procedure, findings, and expectations.  Reviewed keeping PWB for 6 weeks.  Will go home on Keflex for 2 weeks.  Ready to be discharged home, if he does well with PT.      Objective:   VITALS:   Vitals:   12/27/18 0202 12/27/18 0538  BP: 112/76 130/76  Pulse: (!) 105 96  Resp: 18 19  Temp: 98.4 F (36.9 C) 97.7 F (36.5 C)  SpO2: 95% 94%    Dorsiflexion/Plantar flexion intact Incision: dressing C/D/I No cellulitis present Compartment soft  LABS Recent Labs    12/27/18 0251  HGB 11.0*  HCT 34.4*  WBC 12.7*  PLT 176    Recent Labs    12/27/18 0251  NA 136  K 4.3  BUN 10  CREATININE 1.04  GLUCOSE 201*     Assessment/Plan: 1 Day Post-Op Procedure(s) (LRB): REIMPLANTATION OF TOTAL KNEE (Left) Foley cath d/c'ed Advance diet Up with therapy D/C IV fluids Discharge home  Morbid Obesity (BMI >40)  Estimated body mass index is 40.89 kg/m as calculated from the following:   Height as of this encounter: 5\' 10"  (1.778 m).   Weight as of this encounter: 129.3 kg. Patient also counseled that weight may inhibit the healing process Patient counseled that losing weight will help with future health issues     [place] Pugh. Kya Mayfield   PAC  12/27/2018, 8:57 AM

## 2018-12-27 NOTE — Discharge Instructions (Signed)
INSTRUCTIONS AFTER JOINT REPLACEMENT   o Remove items at home which could result in a fall. This includes throw rugs or furniture in walking pathways o ICE to the affected joint every three hours while awake for 30 minutes at a time, for at least the first 3-5 days, and then as needed for pain and swelling.  Continue to use ice for pain and swelling. You may notice swelling that will progress down to the foot and ankle.  This is normal after surgery.  Elevate your leg when you are not up walking on it.   o Continue to use the breathing machine you got in the hospital (incentive spirometer) which will help keep your temperature down.  It is common for your temperature to cycle up and down following surgery, especially at night when you are not up moving around and exerting yourself.  The breathing machine keeps your lungs expanded and your temperature down.   DIET:  As you were doing prior to hospitalization, we recommend a well-balanced diet.  DRESSING / WOUND CARE / SHOWERING  Keep the surgical dressing until follow up.  The dressing is water proof, so you can shower without any extra covering.  IF THE DRESSING FALLS OFF or the wound gets wet inside, change the dressing with sterile gauze.  Please use good hand washing techniques before changing the dressing.  Do not use any lotions or creams on the incision until instructed by your surgeon.    ACTIVITY  o Increase activity slowly as tolerated, but follow the weight bearing instructions below.   o No driving for 6 weeks or until further direction given by your physician.  You cannot drive while taking narcotics.  o No lifting or carrying greater than 10 lbs. until further directed by your surgeon. o Avoid periods of inactivity such as sitting longer than an hour when not asleep. This helps prevent blood clots.  o You may return to work once you are authorized by your doctor.     WEIGHT BEARING   Partial weight bearing with assist device as  directed.  50% for 6 weeks.   EXERCISES  Results after joint replacement surgery are often greatly improved when you follow the exercise, range of motion and muscle strengthening exercises prescribed by your doctor. Safety measures are also important to protect the joint from further injury. Any time any of these exercises cause you to have increased pain or swelling, decrease what you are doing until you are comfortable again and then slowly increase them. If you have problems or questions, call your caregiver or physical therapist for advice.   Rehabilitation is important following a joint replacement. After just a few days of immobilization, the muscles of the leg can become weakened and shrink (atrophy).  These exercises are designed to build up the tone and strength of the thigh and leg muscles and to improve motion. Often times heat used for twenty to thirty minutes before working out will loosen up your tissues and help with improving the range of motion but do not use heat for the first two weeks following surgery (sometimes heat can increase post-operative swelling).   These exercises can be done on a training (exercise) mat, on the floor, on a table or on a bed. Use whatever works the best and is most comfortable for you.    Use music or television while you are exercising so that the exercises are a pleasant break in your day. This will make your life better  with the exercises acting as a break in your routine that you can look forward to.   Perform all exercises about fifteen times, three times per day or as directed.  You should exercise both the operative leg and the other leg as well.  Exercises include:    Quad Sets - Tighten up the muscle on the front of the thigh (Quad) and hold for 5-10 seconds.    Straight Leg Raises - With your knee straight (if you were given a brace, keep it on), lift the leg to 60 degrees, hold for 3 seconds, and slowly lower the leg.  Perform this exercise  against resistance later as your leg gets stronger.   Leg Slides: Lying on your back, slowly slide your foot toward your buttocks, bending your knee up off the floor (only go as far as is comfortable). Then slowly slide your foot back down until your leg is flat on the floor again.   Angel Wings: Lying on your back spread your legs to the side as far apart as you can without causing discomfort.   Hamstring Strength:  Lying on your back, push your heel against the floor with your leg straight by tightening up the muscles of your buttocks.  Repeat, but this time bend your knee to a comfortable angle, and push your heel against the floor.  You may put a pillow under the heel to make it more comfortable if necessary.   A rehabilitation program following joint replacement surgery can speed recovery and prevent re-injury in the future due to weakened muscles. Contact your doctor or a physical therapist for more information on knee rehabilitation.    CONSTIPATION  Constipation is defined medically as fewer than three stools per week and severe constipation as less than one stool per week.  Even if you have a regular bowel pattern at home, your normal regimen is likely to be disrupted due to multiple reasons following surgery.  Combination of anesthesia, postoperative narcotics, change in appetite and fluid intake all can affect your bowels.   YOU MUST use at least one of the following options; they are listed in order of increasing strength to get the job done.  They are all available over the counter, and you may need to use some, POSSIBLY even all of these options:    Drink plenty of fluids (prune juice may be helpful) and high fiber foods Colace 100 mg by mouth twice a day  Senokot for constipation as directed and as needed Dulcolax (bisacodyl), take with full glass of water  Miralax (polyethylene glycol) once or twice a day as needed.  If you have tried all these things and are unable to have a  bowel movement in the first 3-4 days after surgery call either your surgeon or your primary doctor.    If you experience loose stools or diarrhea, hold the medications until you stool forms back up.  If your symptoms do not get better within 1 week or if they get worse, check with your doctor.  If you experience "the worst abdominal pain ever" or develop nausea or vomiting, please contact the office immediately for further recommendations for treatment.   ITCHING:  If you experience itching with your medications, try taking only a single pain pill, or even half a pain pill at a time.  You can also use Benadryl over the counter for itching or also to help with sleep.   TED HOSE STOCKINGS:  Use stockings on both legs until  for at least 2 weeks or as directed by physician office. They may be removed at night for sleeping.  MEDICATIONS:  See your medication summary on the After Visit Summary that nursing will review with you.  You may have some home medications which will be placed on hold until you complete the course of blood thinner medication.  It is important for you to complete the blood thinner medication as prescribed.  PRECAUTIONS:  If you experience chest pain or shortness of breath - call 911 immediately for transfer to the hospital emergency department.   If you develop a fever greater that 101 F, purulent drainage from wound, increased redness or drainage from wound, foul odor from the wound/dressing, or calf pain - CONTACT YOUR SURGEON.                                                   FOLLOW-UP APPOINTMENTS:  If you do not already have a post-op appointment, please call the office for an appointment to be seen by your surgeon.  Guidelines for how soon to be seen are listed in your After Visit Summary, but are typically between 1-4 weeks after surgery.  OTHER INSTRUCTIONS:   Knee Replacement:  Do not place pillow under knee, focus on keeping the knee straight while resting. CPM  instructions: 0-90 degrees, 2 hours in the morning, 2 hours in the afternoon, and 2 hours in the evening. Place foam block, curve side up under heel at all times except when in CPM or when walking.  DO NOT modify, tear, cut, or change the foam block in any way.  MAKE SURE YOU:   Understand these instructions.   Get help right away if you are not doing well or get worse.    Thank you for letting us be a part of your medical care team.  It is a privilege we respect greatly.  We hope these instructions will help you stay on track for a fast and full recovery!

## 2018-12-31 NOTE — Discharge Summary (Signed)
Physician Discharge Summary  Patient ID: [name] MRN: [id number] DOB/AGE: 09-28-1970 48 y.o.  Admit date: 12/26/2018 Discharge date: 12/27/2018   Procedures:  Procedure(s) (LRB): REIMPLANTATION OF TOTAL KNEE (Left)  Attending Physician:  Dr. Durene RomansMatthew Olin   Admission Diagnoses:   Reimplantation of the left TKA  Discharge Diagnoses:  Active Problems:   S/P left total knee reimplantation   Morbid obesity (HCC)  Past Medical History:  Diagnosis Date  . Anxiety    occasionally  . Arthritis   . Chest pain   . Dysrhythmia    SVT  . GERD (gastroesophageal reflux disease)   . HTN (hypertension)   . Pre-diabetes   . Sleep apnea    cpap   . SVT (supraventricular tachycardia) (HCC)     HPI:    Pt is a 48 y.o. male who underwent a resection of the left TKA due to infection on 09/17/18. Since that time he has a course of IV antibiotics and followed by infectious Disease.  He has been off of antibiotics and no return of symptoms.  Plan is to remove the spacer and reimplant a left TKA.  Various options are discussed with the patient. Risks, benefits and expectations were discussed with the patient. Patient understand the risks, benefits and expectations and wishes to proceed with surgery.  Risks including but not limited to the risk of anesthesia, blood clots, nerve damage, blood vessel damage, failure of the prosthesis, infection and up to and including death.  It was also discussed about the possibility of having to do a repeat spacer and IV antibiotics, depending on how the knee looks.   Patient understand the risks, benefits and expectations and wishes to proceed with surgery.   PCP: Sondra ComeWoods, Samuel, MD   Discharged Condition: good  Hospital Course:  Patient underwent the above stated procedure on 12/26/2018. Patient tolerated the procedure well and brought to the recovery room in good condition and subsequently to the floor.  POD #1 BP: 130/76 ; Pulse: 96 ; Temp: 97.7 F (36.5  C) ; Resp: 19 Patient reports pain as mild, pain controlled.  No events throughout the night.  Dr. Charlann Boxerlin discussed the procedure, findings, and expectations.  Reviewed keeping PWB for 6 weeks.  Will go home on Keflex for 2 weeks.  Ready to be discharged home. Dorsiflexion/plantar flexion intact, incision: dressing C/D/I, no cellulitis present and compartment soft.   LABS  Basename    HGB     11.0  HCT     34.4    Discharge Exam: General appearance: alert, cooperative and no distress Extremities: Homans sign is negative, no sign of DVT, no edema, redness or tenderness in the calves or thighs and no ulcers, gangrene or trophic changes  Disposition:  Home with follow up in 2 weeks   Follow-up Information    Durene Romanslin, Burlie Cajamarca, MD. Schedule an appointment as soon as possible for a visit in 2 weeks.   Specialty: Orthopedic Surgery Contact information: [address] [place] [id number] [phone]           Discharge Instructions    Call MD / Call 911   Complete by: As directed    If you experience chest pain or shortness of breath, CALL 911 and be transported to the hospital emergency room.  If you develope a fever above 101 F, pus (white drainage) or increased drainage or redness at the wound, or calf pain, call your surgeon's office.   Change dressing   Complete  by: As directed    Maintain surgical dressing until follow up in the clinic. If the edges start to pull up, may reinforce with tape. If the dressing is no longer working, may remove and cover with gauze and tape, but must keep the area dry and clean.  Call with any questions or concerns.   Constipation Prevention   Complete by: As directed    Drink plenty of fluids.  Prune juice may be helpful.  You may use a stool softener, such as Colace (over the counter) 100 mg twice a day.  Use MiraLax (over the counter) for constipation as needed.   Diet - low sodium heart healthy   Complete by: As directed     Discharge instructions   Complete by: As directed    Maintain surgical dressing until follow up in the clinic. If the edges start to pull up, may reinforce with tape. If the dressing is no longer working, may remove and cover with gauze and tape, but must keep the area dry and clean.  Follow up in 2 weeks at [facility]. Call with any questions or concerns.   Partial weight bearing   Complete by: As directed    % Body Weight: 50   Laterality: left   Extremity: Lower   TED hose   Complete by: As directed    Use stockings (TED hose) for 2 weeks on both leg(s).  You may remove them at night for sleeping.      Allergies as of 12/27/2018      Reactions   Tylenol [acetaminophen] Other (See Comments)   Can't have because of liver issues      Medication List    STOP taking these medications   traMADol 50 MG tablet Commonly known as: ULTRAM     TAKE these medications   acetaminophen 500 MG tablet Commonly known as: TYLENOL Take 2 tablets (1,000 mg total) by mouth every 8 (eight) hours.   aspirin 81 MG chewable tablet Commonly known as: Aspirin Childrens Chew 1 tablet (81 mg total) by mouth 2 (two) times daily. Take for 4 weeks, then resume regular dose.   buPROPion 100 MG tablet Commonly known as: WELLBUTRIN Take 100 mg by mouth daily.   cephALEXin 500 MG capsule Commonly known as: Keflex Take 1 capsule (500 mg total) by mouth 3 (three) times daily for 14 days.   docusate sodium 100 MG capsule Commonly known as: Colace Take 1 capsule (100 mg total) by mouth 2 (two) times daily.   enalapril 20 MG tablet Commonly known as: VASOTEC Take 20 mg by mouth daily.   escitalopram 20 MG tablet Commonly known as: LEXAPRO Take 20 mg by mouth daily.   ferrous sulfate 325 (65 FE) MG tablet Commonly known as: FerrouSul Take 1 tablet (325 mg total) by mouth 3 (three) times daily with meals for 14 days. Notes to patient: Take with next meal   hydrOXYzine 25 MG tablet  Commonly known as: ATARAX/VISTARIL Take 50 mg by mouth at bedtime as needed for anxiety.   methocarbamol 500 MG tablet Commonly known as: Robaxin Take 1 tablet (500 mg total) by mouth every 6 (six) hours as needed for muscle spasms.   metoprolol tartrate 100 MG tablet Commonly known as: LOPRESSOR Take 100 mg by mouth 2 (two) times daily.   multivitamin with minerals tablet Take 1 tablet by mouth daily.   omeprazole 20 MG capsule Commonly known as: PRILOSEC Take 20 mg by mouth daily.   Oxycodone  HCl 10 MG Tabs Take 1-2 tablets (10-20 mg total) by mouth every 4 (four) hours as needed for moderate pain or severe pain.   polyethylene glycol 17 g packet Commonly known as: MIRALAX / GLYCOLAX Take 17 g by mouth 2 (two) times daily.   traZODone 100 MG tablet Commonly known as: DESYREL Take 200 mg by mouth at bedtime.   verapamil 120 MG CR tablet Commonly known as: CALAN-SR Take 120 mg by mouth daily.            Discharge Care Instructions  (From admission, onward)         Start     Ordered   12/27/18 0000  Change dressing    Comments: Maintain surgical dressing until follow up in the clinic. If the edges start to pull up, may reinforce with tape. If the dressing is no longer working, may remove and cover with gauze and tape, but must keep the area dry and clean.  Call with any questions or concerns.   12/27/18 0933   12/27/18 0000  Partial weight bearing    Question Answer Comment  % Body Weight 50   Laterality left   Extremity Lower      12/27/18 0933           Signed: Anastasio Auerbach. Hollyn Stucky   PA-C  12/31/2018, 9:42 AM

## 2019-01-06 ENCOUNTER — Encounter (HOSPITAL_COMMUNITY): Payer: Self-pay | Admitting: Orthopedic Surgery

## 2019-04-29 IMAGING — RF DG KNEE 3 VIEWS*L*
1 series · 1 of 1 positions shown · non-contrast
Comparison: None in PACs

CLINICAL DATA: Status post repair of the medial compartment of the
right knee

EXAM:
LEFT KNEE - 3 VIEW; DG C-ARM 1-60 MIN-NO REPORT

[Series 1: run · 1 of 1 slices shown]
[im 1/1]
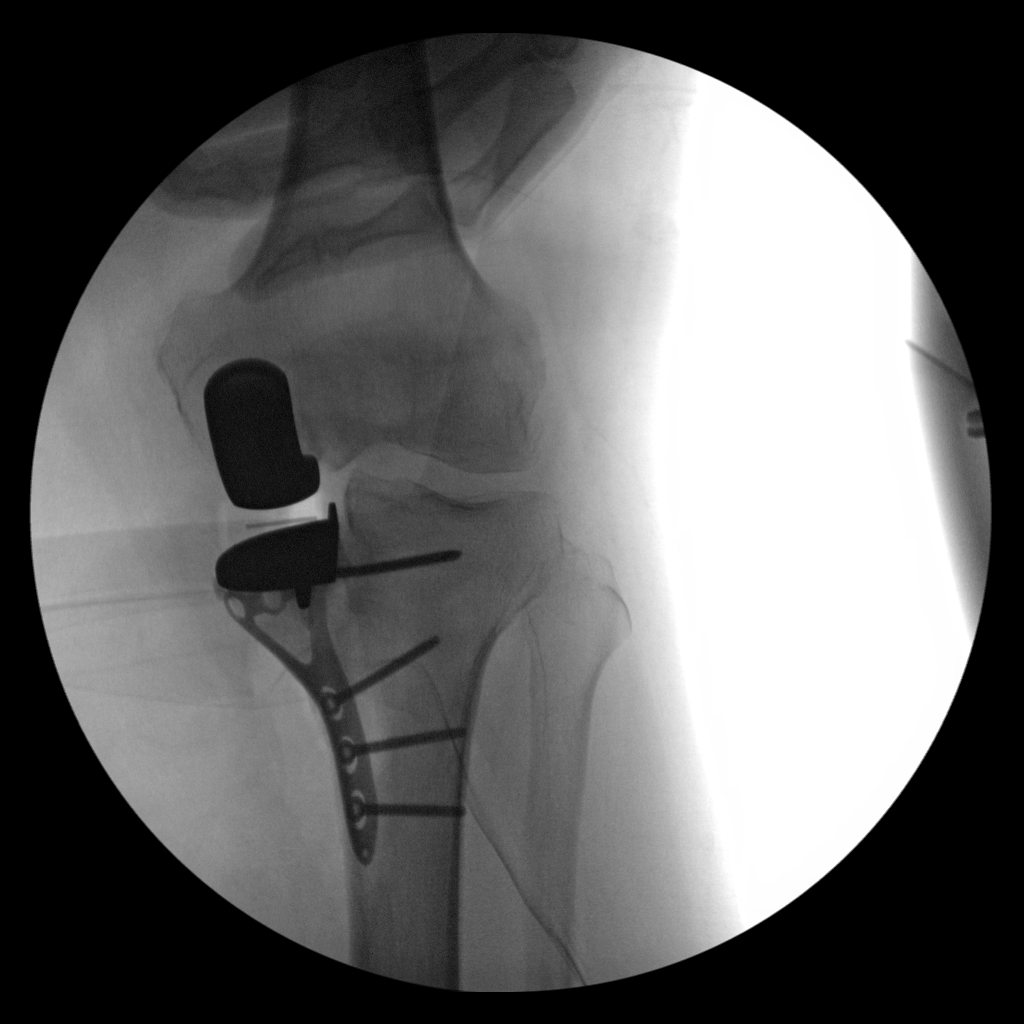

[1 of 1 positions shown; findings below may reference images not displayed]

FINDINGS: The reported fluoro time is 28.5 seconds with a cumulative dose of
2.4 mGy. A single fluoro spot images submitted. There is a
prosthetic device present in the medial femoral condyle in the
medial tibial plateau. Radiographic positioning of these prosthetic
components appears good.
IMPRESSION: The patient has undergone replacement of the components of the
medial compartment of the left knee without immediate complication.

## 2020-08-17 IMAGING — CR CHEST  1 VIEW
1 series · 1 of 1 positions shown · non-contrast
Comparison: 03/25/2015

CLINICAL DATA: PICC line not flushing

EXAM:
CHEST  1 VIEW

[w chest pa]
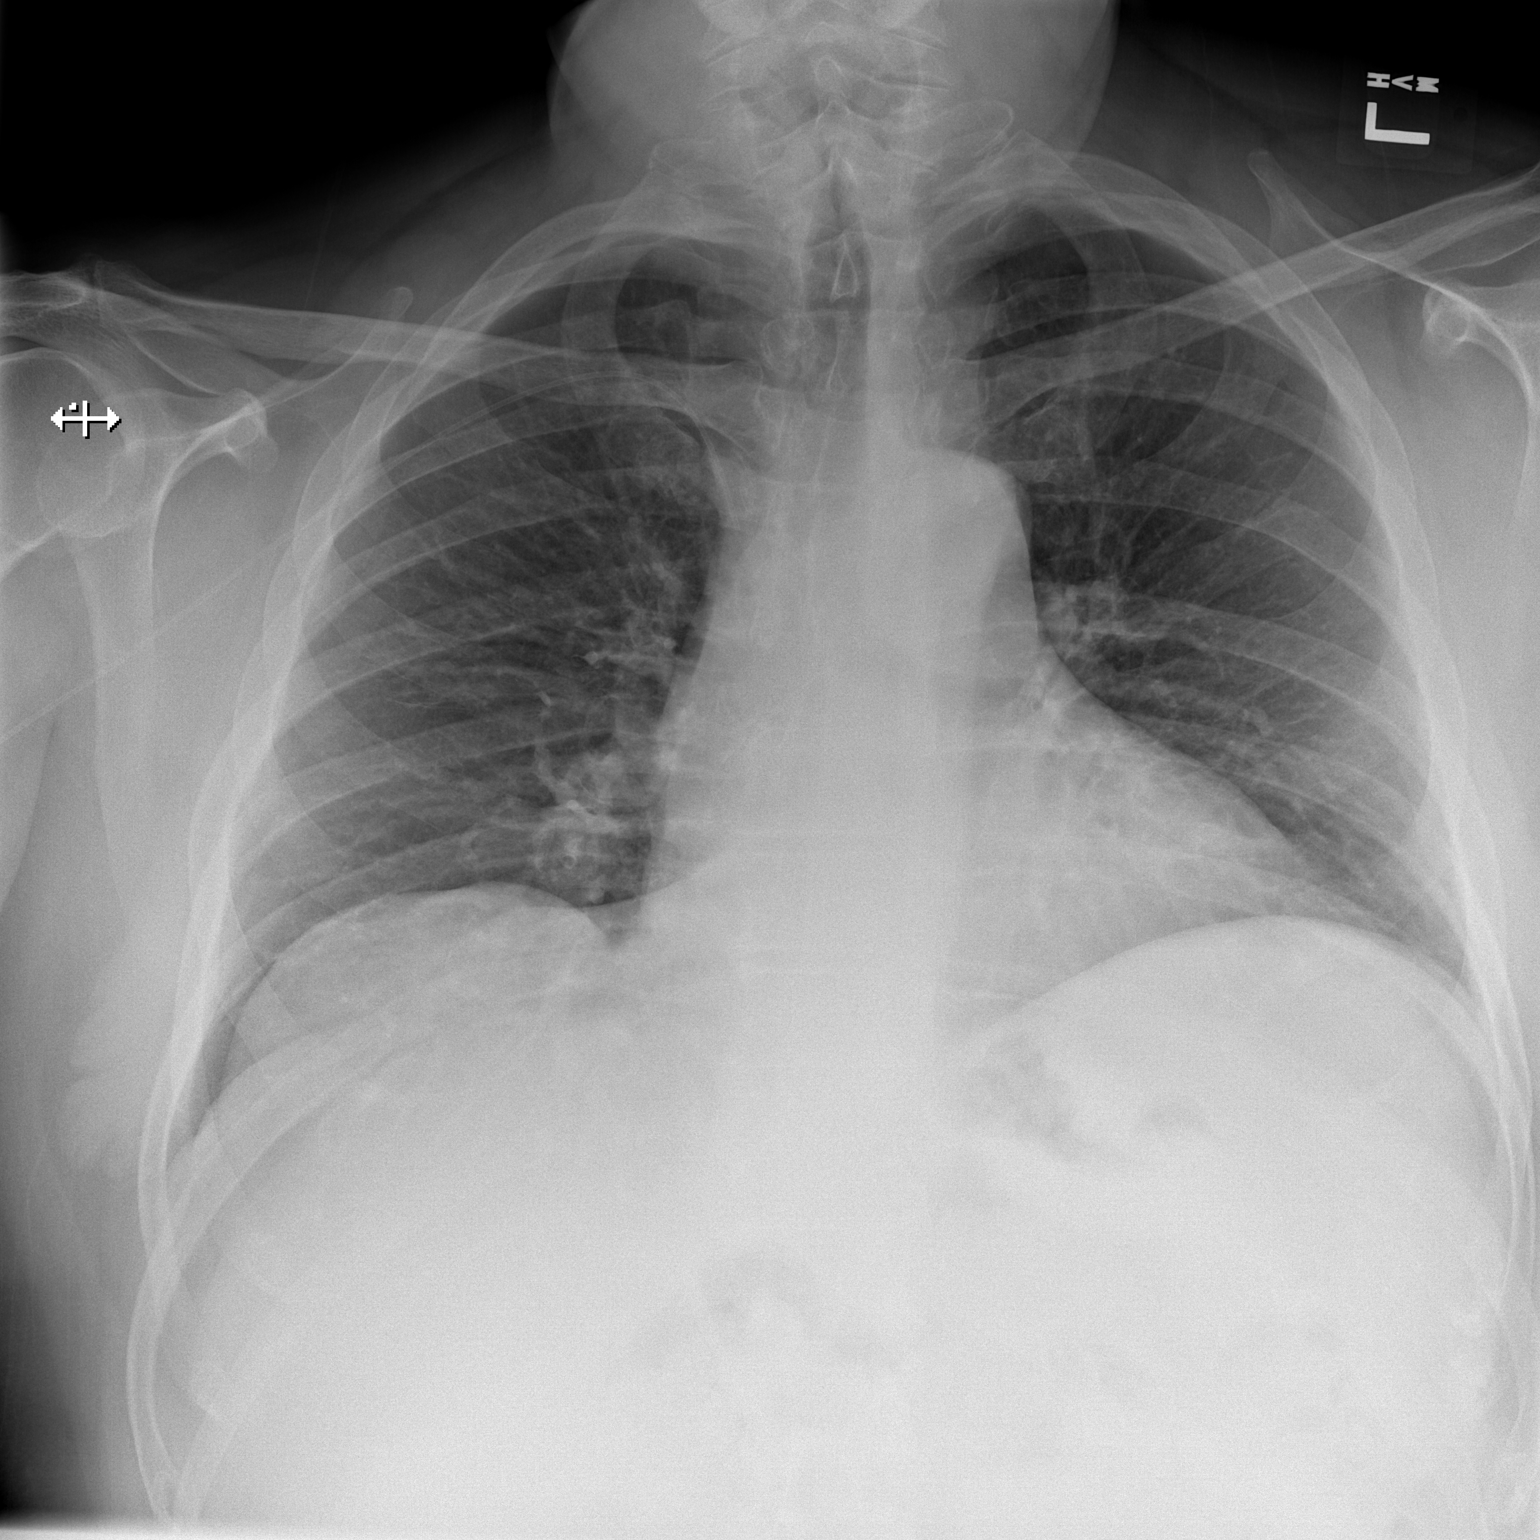

[1 of 1 positions shown; findings below may reference images not displayed]

FINDINGS: Right arm PICC tip in the proximal SVC.

Heart size and vascularity normal. Lungs clear without infiltrate or
effusion.
IMPRESSION: No acute abnormality.  Right arm PICC tip proximal SVC.

## 2020-10-22 ENCOUNTER — Other Ambulatory Visit: Payer: Self-pay

## 2020-10-22 ENCOUNTER — Encounter (HOSPITAL_COMMUNITY): Payer: Self-pay

## 2020-10-22 ENCOUNTER — Encounter (HOSPITAL_COMMUNITY)
Admission: RE | Admit: 2020-10-22 | Discharge: 2020-10-22 | Disposition: A | Discharge status: Home / Self Care | Payer: No Typology Code available for payment source | Source: Ambulatory Visit | Attending: Orthopedic Surgery | Admitting: Orthopedic Surgery

## 2020-10-22 DIAGNOSIS — Z01818 Encounter for other preprocedural examination: Secondary | ICD-10-CM | POA: Diagnosis present

## 2020-10-22 LAB — BASIC METABOLIC PANEL
Anion gap: 7 (ref 5–15)
BUN: 15 mg/dL (ref 6–20)
CO2: 28 mmol/L (ref 22–32)
Calcium: 8.9 mg/dL (ref 8.9–10.3)
Chloride: 103 mmol/L (ref 98–111)
Creatinine, Ser: 0.77 mg/dL (ref 0.61–1.24)
GFR, Estimated: >60 mL/min (ref >60)
Glucose, Bld: 140 mg/dL — ABNORMAL HIGH (ref 70–99)
Potassium: 4.4 mmol/L (ref 3.5–5.1)
Sodium: 138 mmol/L (ref 135–145)

## 2020-10-22 LAB — CBC
HCT: 42.3 % (ref 39.0–52.0)
Hemoglobin: 13.7 g/dL (ref 13.0–17.0)
MCH: 27.1 pg (ref 26.0–34.0)
MCHC: 32.4 g/dL (ref 30.0–36.0)
MCV: 83.8 fL (ref 80.0–100.0)
Platelets: 195 10*3/uL (ref 150–400)
RBC: 5.05 MIL/uL (ref 4.22–5.81)
RDW: 14.3 % (ref 11.5–15.5)
WBC: 7.3 10*3/uL (ref 4.0–10.5)
nRBC: 0 % (ref 0.0–0.2)

## 2020-10-22 LAB — SURGICAL PCR SCREEN
MRSA, PCR: NEGATIVE
Staphylococcus aureus: NEGATIVE

## 2020-10-22 LAB — HEMOGLOBIN A1C
Hgb A1c MFr Bld: 5.6 % (ref 4.8–5.6)
Mean Plasma Glucose: 114.02 mg/dL

## 2020-10-22 NOTE — Progress Notes (Signed)
COVID Vaccine Completed:Yes  Date COVID test 10/28/20   PCP - [facility]. Re[facility] requested records Cardiologist - none  Chest x-ray - no EKG - 10/22/20-chart Stress Test - no ECHO - 2008 Cardiac Cath - no Pacemaker/ICD device last checked:NA  Sleep Study - yes CPAP - no. The mask makes his mouth dry even with humidity all the way up.  Fasting Blood Sugar - NA Checks Blood Sugar _____ times a day  Blood Thinner Instructions:NA Aspirin Instructions: Last Dose:  Anesthesia review: Yes  Patient denies shortness of breath, fever, cough and chest pain at PAT appointment No SOB with activities.  Patient verbalized understanding of instructions that were given to them at the PAT appointment. Patient was also instructed that they will need to review over the PAT instructions again at home before surgery. yes

## 2020-10-22 NOTE — Patient Instructions (Addendum)
DUE TO COVID-19 ONLY ONE VISITOR IS ALLOWED TO COME WITH YOU AND STAY IN THE WAITING ROOM ONLY DURING PRE OP AND PROCEDURE DAY OF SURGERY. THE 2 VISITORS  MAY VISIT WITH YOU AFTER SURGERY IN YOUR PRIVATE ROOM DURING VISITING HOURS ONLY!  YOU NEED TO HAVE A COVID 19 TEST ON__8/25_____THIS TEST MUST BE DONE BEFORE SURGERY,  AS OUTLINED IN YOUR HANDOUT.                [name]     Your procedure is scheduled on: 11/01/20   Report to [facility] Main  Entrance   Report to admitting at  11:45 AM     Call this number if you have problems the morning of surgery [phone]    Remember: Do not eat food after Midnight.     You may have clear liquids until 11:30 am  CLEAR LIQUID DIET   Foods Allowed                                                                     Foods Excluded  Coffee and tea, regular and decaf                             liquids that you cannot  Plain Jell-O any favor except red or purple                                           see through such as: Fruit ices (not with fruit pulp)                                     milk, soups, orange juice  Iced Popsicles                                    All solid food Carbonated beverages, regular and diet                                    Cranberry, grape and apple juices Sports drinks like Gatorade Lightly seasoned clear broth or consume(fat free) Sugar, honey syrup      BRUSH YOUR TEETH MORNING OF SURGERY AND RINSE YOUR MOUTH OUT, NO CHEWING GUM CANDY OR MINTS.     Take these medicines the morning of surgery with A SIP OF WATER: Wellbutrin, Lexapro, Verapamil, Omeprazole Bring your mask and tubing                               You may not have any metal on your body including              piercings  Do not wear jewelry, lotions, powders or deodorant  Men may shave face and neck.   Do not bring valuables to the hospital. [name] IS NOT             RESPONSIBLE   FOR  VALUABLES.  Contacts, dentures or bridgework may not be worn into surgery.  Leave suitcase in the car. After surgery it may be brought to your room.                  Please read over the following fact sheets you were given: _____________________________________________________________________             [name] - Preparing for Surgery Before surgery, you can play an important role.  Because skin is not sterile, your skin needs to be as free of germs as possible.  You can reduce the number of germs on your skin by washing with CHG (chlorahexidine gluconate) soap before surgery.  CHG is an antiseptic cleaner which kills germs and bonds with the skin to continue killing germs even after washing. Please DO NOT use if you have an allergy to CHG or antibacterial soaps.  If your skin becomes reddened/irritated stop using the CHG and inform your nurse when you arrive at Short Stay.   You may shave your face/neck. Please follow these instructions carefully:  1.  Shower with CHG Soap the night before surgery and the  morning of Surgery.  2.  If you choose to wash your hair, wash your hair first as usual with your  normal  shampoo.  3.  After you shampoo, rinse your hair and body thoroughly to remove the  shampoo.                            4.  Use CHG as you would any other liquid soap.  You can apply chg directly  to the skin and wash                       Gently with a scrungie or clean washcloth.  5.  Apply the CHG Soap to your body ONLY FROM THE NECK DOWN.   Do not use on face/ open                           Wound or open sores. Avoid contact with eyes, ears mouth and genitals (private parts).                       Wash face,  Genitals (private parts) with your normal soap.             6.  Wash thoroughly, paying special attention to the area where your surgery  will be performed.  7.  Thoroughly rinse your body with warm water from the neck down.  8.  DO NOT shower/wash with your normal  soap after using and rinsing off  the CHG Soap.             9.  Pat yourself dry with a clean towel.            10.  Wear clean pajamas.            11.  Place clean sheets on your bed the night of your first shower and do not  sleep with pets. Day of Surgery : Do not apply any lotions/deodorants the morning of surgery.  Please wear clean  clothes to the hospital/surgery center.  FAILURE TO FOLLOW THESE INSTRUCTIONS MAY RESULT IN THE CANCELLATION OF YOUR SURGERY PATIENT SIGNATURE_________________________________  NURSE SIGNATURE__________________________________  ________________________________________________________________________

## 2020-10-28 ENCOUNTER — Other Ambulatory Visit: Payer: Self-pay | Admitting: Orthopedic Surgery

## 2020-10-29 LAB — SARS CORONAVIRUS 2 (TAT 6-24 HRS): SARS Coronavirus 2: NEGATIVE

## 2020-10-31 NOTE — H&P (Signed)
TOTAL KNEE REVISION ADMISSION H&P  Patient is being admitted for left knee irrigation and debridement  Subjective:  Chief Complaint: Recurrent infection, left total knee, following treatment for prosthetic joint infection with resection and two stage revision, on chronic oral suppressive antibiotics  HPI: Thomas [name], 50 y.o. male, has a complex history with his left knee. He is well known to our practice. He underwent resection of his left total knee in July 2020, and subsequently had the knee reimplanted  in October 2020 following a course of IV antibiotics. Since that time, he has been on oral suppressive medication with Keflex, and has not shown [name] of infection. He presented to our office on 10/15/20 with reports of increased swelling, warmth, and erythema over the past few weeks. We obtained ESR (awaiting result) and CRP (45.1) which were suggestive of recurrent infection. Dr. Alvan Dame discussed with him again the significant morbidity associated with removal of well fixed implants, and recommended at this point that we proceed with excisional and nonexcisional debridement of his left knee with attempted preservation of his joint. We will take cultures at the time of surgery and consult ID, particularly as he has had recurrence of infection despite chronic suppressive antibiotics.   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-impregnated cement spacer 09/17/2018   S/P total knee replacement 09/25/2017   Left knee pain 07/16/2017   S/P revision of total knee, left 07/16/2017   Tibial plateau fracture, left 06/12/2017   Past Medical History:  Diagnosis Date   Anxiety    occasionally   Arthritis    Chest pain    Dysrhythmia    SVT   GERD (gastroesophageal  reflux disease)    HTN (hypertension)    Pre-diabetes    Sleep apnea    cpap    SVT (supraventricular tachycardia) ([place])     Past Surgical History:  Procedure Laterality Date   ATRIAL ABLATION SURGERY     CONVERSION TO TOTAL KNEE Left 09/25/2017   Procedure: Left knee conversion from uni compartmental arthroplasty to total knee arthroplasty;  Surgeon: Paralee Cancel, MD;  Location: [facility];  Service: Orthopedics;  Laterality: Left;  90 mins   EXCISIONAL TOTAL KNEE ARTHROPLASTY WITH ANTIBIOTIC SPACERS Left 09/17/2018   Procedure: Resection of left total knee arthroplasty and placement of antibiotic spacer;  Surgeon: Paralee Cancel, MD;  Location: [facility];  Service: Orthopedics;  Laterality: Left;  90 mins   I & D KNEE WITH POLY EXCHANGE Left 07/16/2017   Procedure: LEFT KNEE [place] OUT WITH REIMPLANTATION OF EXISTING IMPLANT AFTER SALINE CLEANING.;  Surgeon: Paralee Cancel, MD;  Location: [facility];  Service: Orthopedics;  Laterality: Left;  with block   JOINT REPLACEMENT     total knee arthroplasty 09-25-17 Dr. Alvan Dame   ORIF TIBIA PLATEAU Left 06/12/2017   Procedure: Left open reduction internal fixation medial tibial plateau fracture with poly revision medial compartmental arthroplasty;  Surgeon: Paralee Cancel, MD;  Location: [facility];  Service: Orthopedics;  Laterality: Left;  90 mins   REIMPLANTATION OF TOTAL KNEE Left 12/26/2018   Procedure: REIMPLANTATION OF TOTAL KNEE;  Surgeon: Paralee Cancel, MD;  Location: [facility];  Service: Orthopedics;  Laterality: Left;  90 mins    No current facility-administered medications for this encounter.  Current Outpatient Medications  Medication Sig Dispense Refill Last Dose   buPROPion (WELLBUTRIN XL) 300 MG 24 hr tablet Take 300 mg by mouth in the morning.      cephALEXin (KEFLEX) 500 MG capsule Take 500 mg by mouth in the morning and at bedtime.      diclofenac (VOLTAREN) 75 MG EC tablet Take 75 mg by mouth 2 (two) times daily.      diclofenac Sodium (VOLTAREN) 1 %  GEL Apply 1 application topically 4 (four) times daily as needed (knee pain.).      DULoxetine (CYMBALTA) 60 MG capsule Take 60 mg by mouth in the morning.      enalapril (VASOTEC) 10 MG tablet Take 10 mg by mouth in the morning.      hydrOXYzine (ATARAX/VISTARIL) 25 MG tablet Take 25-50 mg by mouth See admin instructions. Take 1 tablet (25 mg) by mouth at supper & take 2 tablets (50 mg) by mouth at bedtime.      melatonin 3 MG TABS tablet Take 3 mg by mouth at bedtime.      metoprolol tartrate (LOPRESSOR) 100 MG tablet Take 100 mg by mouth 2 (two) times daily.      omeprazole (PRILOSEC) 20 MG capsule Take 20 mg by mouth in the morning.      traMADol (ULTRAM) 50 MG tablet Take 50 mg by mouth 3 (three) times daily as needed (pain.).      traZODone (DESYREL) 100 MG tablet Take 200 mg by mouth at bedtime.      verapamil (CALAN-SR) 120 MG CR tablet Take 120 mg by mouth in the morning.      Multiple Vitamins-Minerals (MULTIVITAMIN WITH MINERALS) tablet Take 1 tablet by mouth daily.      Allergies  Allergen Reactions   Tylenol [Acetaminophen] Other (See Comments)    Can't have because of liver issues    Social History   Tobacco Use   Smoking status: Never   Smokeless tobacco: Former    Types: Chew, Snuff    Quit date: 2019   Tobacco comments:    30 years ago   Substance Use Topics   Alcohol use: Not Currently    Alcohol/week: 12.0 standard drinks    Types: 12 Standard drinks or equivalent per week    Comment: no    No family history on file.    Review of Systems  Constitutional:  Negative for chills and fever.  Respiratory:  Negative for cough and shortness of breath.   Cardiovascular:  Negative for chest pain.  Gastrointestinal:  Negative for nausea and vomiting.  Musculoskeletal:  Positive for arthralgias.    Objective:  Physical Exam Pleasant 50 year old male awake alert and oriented. He is in no acute distress. He walks in the office with a limp favoring his left knee but no  assist device use regularly  Left knee exam: His surgical incision is healed without drainage. He does have some swelling and erythema noted mainly on the medial aspect of the incision distally. This is tender. He has full knee extension and flexes over 100 degrees Stable medial lateral collateral ligaments   Vital [name] in last 24 hours:    Labs:  Estimated body mass index is 36.73 kg/m as calculated from the following:   Height as of 10/22/20: 5' 10" (1.778 m).   Weight as of 10/22/20: 116.1 kg.  Imaging Review Plain radiographs demonstrate reveal stable well fixed and aligned revised femoral and tibial components. No evidence of obvious complications  or loosening.    Assessment/Plan:  Recurrent left knee prosthetic joint infection  The patient history, physical examination, clinical judgment of the provider and imaging studies are consistent with Recurrent left knee prosthetic joint infection of the left knee(s), previous total knee arthroplasty. Revision total knee arthroplasty is deemed medically necessary. The treatment options including medical management, injection therapy, arthroscopy and revision arthroplasty were discussed at length. The risks and benefits of revision total knee arthroplasty were presented and reviewed. The risks due to aseptic loosening, infection, stiffness, patella tracking problems, thromboembolic complications and other imponderables were discussed. The patient acknowledged the explanation, agreed to proceed with the plan and consent was signed. Patient is being admitted for inpatient treatment for surgery, pain control, PT, OT, prophylactic antibiotics, VTE prophylaxis, progressive ambulation and ADL's and discharge planning.The patient is planning to be discharged  home.  Will need PICC placement with HHRN.   Griffith Citron, PA-C Orthopedic Surgery [facility] [place] [phone]

## 2020-11-01 ENCOUNTER — Inpatient Hospital Stay: Payer: Self-pay

## 2020-11-01 ENCOUNTER — Encounter (HOSPITAL_COMMUNITY)
Admission: RE | Disposition: A | Discharge status: Home Health | Payer: Self-pay | Source: Ambulatory Visit | Attending: Orthopedic Surgery

## 2020-11-01 ENCOUNTER — Other Ambulatory Visit: Payer: Self-pay

## 2020-11-01 ENCOUNTER — Ambulatory Visit (HOSPITAL_COMMUNITY): Payer: No Typology Code available for payment source | Admitting: Physician Assistant

## 2020-11-01 ENCOUNTER — Encounter (HOSPITAL_COMMUNITY): Payer: Self-pay | Admitting: Orthopedic Surgery

## 2020-11-01 ENCOUNTER — Inpatient Hospital Stay (HOSPITAL_COMMUNITY)
Admission: RE | Admit: 2020-11-01 | Discharge: 2020-11-04 | DRG: 486 | Disposition: A | Discharge status: Home Health | Payer: No Typology Code available for payment source | Source: Ambulatory Visit | Attending: Orthopedic Surgery | Admitting: Orthopedic Surgery

## 2020-11-01 ENCOUNTER — Ambulatory Visit (HOSPITAL_COMMUNITY): Payer: No Typology Code available for payment source | Admitting: Anesthesiology

## 2020-11-01 DIAGNOSIS — Z6836 Body mass index (BMI) 36.0-36.9, adult: Secondary | ICD-10-CM

## 2020-11-01 DIAGNOSIS — M00062 Staphylococcal arthritis, left knee: Secondary | ICD-10-CM | POA: Diagnosis not present

## 2020-11-01 DIAGNOSIS — Z792 Long term (current) use of antibiotics: Secondary | ICD-10-CM | POA: Diagnosis not present

## 2020-11-01 DIAGNOSIS — T8459XD Infection and inflammatory reaction due to other internal joint prosthesis, subsequent encounter: Secondary | ICD-10-CM | POA: Diagnosis not present

## 2020-11-01 DIAGNOSIS — Z96659 Presence of unspecified artificial knee joint: Secondary | ICD-10-CM | POA: Diagnosis not present

## 2020-11-01 DIAGNOSIS — K219 Gastro-esophageal reflux disease without esophagitis: Secondary | ICD-10-CM | POA: Diagnosis present

## 2020-11-01 DIAGNOSIS — M1990 Unspecified osteoarthritis, unspecified site: Secondary | ICD-10-CM | POA: Diagnosis present

## 2020-11-01 DIAGNOSIS — T8454XA Infection and inflammatory reaction due to internal left knee prosthesis, initial encounter: Principal | ICD-10-CM | POA: Diagnosis present

## 2020-11-01 DIAGNOSIS — G4730 Sleep apnea, unspecified: Secondary | ICD-10-CM | POA: Diagnosis present

## 2020-11-01 DIAGNOSIS — I10 Essential (primary) hypertension: Secondary | ICD-10-CM | POA: Diagnosis present

## 2020-11-01 DIAGNOSIS — Z6835 Body mass index (BMI) 35.0-35.9, adult: Secondary | ICD-10-CM | POA: Diagnosis not present

## 2020-11-01 DIAGNOSIS — B957 Other staphylococcus as the cause of diseases classified elsewhere: Secondary | ICD-10-CM | POA: Diagnosis not present

## 2020-11-01 DIAGNOSIS — Z87891 Personal history of nicotine dependence: Secondary | ICD-10-CM

## 2020-11-01 DIAGNOSIS — R7303 Prediabetes: Secondary | ICD-10-CM | POA: Diagnosis present

## 2020-11-01 DIAGNOSIS — Z79899 Other long term (current) drug therapy: Secondary | ICD-10-CM

## 2020-11-01 DIAGNOSIS — Z1629 Resistance to other single specified antibiotic: Secondary | ICD-10-CM | POA: Diagnosis present

## 2020-11-01 DIAGNOSIS — Z96652 Presence of left artificial knee joint: Secondary | ICD-10-CM

## 2020-11-01 DIAGNOSIS — T8450XD Infection and inflammatory reaction due to unspecified internal joint prosthesis, subsequent encounter: Secondary | ICD-10-CM | POA: Diagnosis not present

## 2020-11-01 DIAGNOSIS — E669 Obesity, unspecified: Secondary | ICD-10-CM | POA: Diagnosis present

## 2020-11-01 DIAGNOSIS — Y831 Surgical operation with implant of artificial internal device as the cause of abnormal reaction of the patient, or of later complication, without mention of misadventure at the time of the procedure: Secondary | ICD-10-CM | POA: Diagnosis present

## 2020-11-01 HISTORY — PX: I & D KNEE WITH POLY EXCHANGE: SHX5024

## 2020-11-01 LAB — TYPE AND SCREEN
ABO/RH(D): A POS
Antibody Screen: NEGATIVE

## 2020-11-01 SURGERY — IRRIGATION AND DEBRIDEMENT KNEE WITH POLY EXCHANGE
Anesthesia: General | Site: Knee | Laterality: Left

## 2020-11-01 MED ORDER — DULOXETINE HCL 60 MG PO CPEP
60.0000 mg | ORAL_CAPSULE | Freq: Every morning | ORAL | Status: DC
  Administered 2020-11-02 – 2020-11-04 (×3): 60 mg via ORAL
  Filled 2020-11-01 (×2): qty 1

## 2020-11-01 MED ORDER — HYDROMORPHONE HCL 1 MG/ML IJ SOLN
INTRAMUSCULAR | Status: DC | PRN
  Administered 2020-11-01: .5 mg via INTRAVENOUS

## 2020-11-01 MED ORDER — PROPOFOL 10 MG/ML IV BOLUS
INTRAVENOUS | Status: AC
  Filled 2020-11-01: qty 20

## 2020-11-01 MED ORDER — BISACODYL 10 MG RE SUPP: 10.0000 mg | Freq: Every day | RECTAL | Status: DC | PRN

## 2020-11-01 MED ORDER — ONDANSETRON HCL 4 MG/2ML IJ SOLN
4.0000 mg | Freq: Four times a day (QID) | INTRAMUSCULAR | Status: DC | PRN
  Administered 2020-11-03: 4 mg via INTRAVENOUS
  Filled 2020-11-01: qty 2

## 2020-11-01 MED ORDER — HYDROMORPHONE HCL 1 MG/ML IJ SOLN
0.5000 mg | INTRAMUSCULAR | Status: DC | PRN
  Administered 2020-11-01 – 2020-11-04 (×9): 1 mg via INTRAVENOUS
  Filled 2020-11-01 (×9): qty 1

## 2020-11-01 MED ORDER — LACTATED RINGERS IV SOLN: INTRAVENOUS | Status: DC

## 2020-11-01 MED ORDER — OXYCODONE HCL 5 MG PO TABS
5.0000 mg | ORAL_TABLET | Freq: Once | ORAL | Status: AC | PRN
Start: 2020-11-01 — End: 2020-11-01
  Administered 2020-11-01: 5 mg via ORAL

## 2020-11-01 MED ORDER — HYDROXYZINE HCL 25 MG PO TABS
50.0000 mg | ORAL_TABLET | Freq: Every day | ORAL | Status: DC
  Administered 2020-11-01 – 2020-11-03 (×3): 50 mg via ORAL
  Filled 2020-11-01 (×3): qty 2

## 2020-11-01 MED ORDER — HYDROMORPHONE HCL 1 MG/ML IJ SOLN
0.2500 mg | INTRAMUSCULAR | Status: DC | PRN
  Administered 2020-11-01: 0.5 mg via INTRAVENOUS

## 2020-11-01 MED ORDER — SODIUM CHLORIDE 0.9 % IV SOLN: INTRAVENOUS | Status: DC

## 2020-11-01 MED ORDER — PROPOFOL 10 MG/ML IV BOLUS
INTRAVENOUS | Status: DC | PRN
  Administered 2020-11-01: 200 mg via INTRAVENOUS

## 2020-11-01 MED ORDER — HYDROXYZINE HCL 25 MG PO TABS
25.0000 mg | ORAL_TABLET | Freq: Every day | ORAL | Status: DC
  Administered 2020-11-02 – 2020-11-03 (×2): 25 mg via ORAL
  Filled 2020-11-01 (×2): qty 1

## 2020-11-01 MED ORDER — TRANEXAMIC ACID-NACL 1000-0.7 MG/100ML-% IV SOLN
1000.0000 mg | INTRAVENOUS | Status: AC
  Administered 2020-11-01: 1000 mg via INTRAVENOUS
  Filled 2020-11-01: qty 100

## 2020-11-01 MED ORDER — ONDANSETRON HCL 4 MG/2ML IJ SOLN: 4.0000 mg | Freq: Once | INTRAMUSCULAR | Status: DC | PRN

## 2020-11-01 MED ORDER — TRANEXAMIC ACID-NACL 1000-0.7 MG/100ML-% IV SOLN
1000.0000 mg | Freq: Once | INTRAVENOUS | Status: AC
  Administered 2020-11-01: 1000 mg via INTRAVENOUS
  Filled 2020-11-01: qty 100

## 2020-11-01 MED ORDER — ORAL CARE MOUTH RINSE: 15.0000 mL | Freq: Once | OROMUCOSAL | Status: AC

## 2020-11-01 MED ORDER — FERROUS SULFATE 325 (65 FE) MG PO TABS
325.0000 mg | ORAL_TABLET | Freq: Three times a day (TID) | ORAL | Status: DC
  Administered 2020-11-02 – 2020-11-04 (×6): 325 mg via ORAL
  Filled 2020-11-01 (×6): qty 1

## 2020-11-01 MED ORDER — FENTANYL CITRATE (PF) 100 MCG/2ML IJ SOLN
INTRAMUSCULAR | Status: AC
  Filled 2020-11-01: qty 2

## 2020-11-01 MED ORDER — ASPIRIN 81 MG PO CHEW
81.0000 mg | CHEWABLE_TABLET | Freq: Two times a day (BID) | ORAL | Status: DC
  Administered 2020-11-01 – 2020-11-04 (×6): 81 mg via ORAL
  Filled 2020-11-01 (×6): qty 1

## 2020-11-01 MED ORDER — OXYCODONE HCL 5 MG/5ML PO SOLN: 5.0000 mg | Freq: Once | ORAL | Status: AC | PRN

## 2020-11-01 MED ORDER — HYDROMORPHONE HCL 2 MG/ML IJ SOLN
INTRAMUSCULAR | Status: AC
  Filled 2020-11-01: qty 1

## 2020-11-01 MED ORDER — DEXMEDETOMIDINE (PRECEDEX) IN NS 20 MCG/5ML (4 MCG/ML) IV SYRINGE
PREFILLED_SYRINGE | INTRAVENOUS | Status: DC | PRN
  Administered 2020-11-01: 4 ug via INTRAVENOUS
  Administered 2020-11-01 (×2): 8 ug via INTRAVENOUS

## 2020-11-01 MED ORDER — METOCLOPRAMIDE HCL 5 MG PO TABS: 5.0000 mg | ORAL_TABLET | Freq: Three times a day (TID) | ORAL | Status: DC | PRN

## 2020-11-01 MED ORDER — IBUPROFEN 200 MG PO TABS: 200.0000 mg | ORAL_TABLET | Freq: Four times a day (QID) | ORAL | Status: DC | PRN

## 2020-11-01 MED ORDER — VANCOMYCIN HCL 1000 MG IV SOLR
INTRAVENOUS | Status: AC
  Filled 2020-11-01: qty 40

## 2020-11-01 MED ORDER — IBUPROFEN 100 MG/5ML PO SUSP
200.0000 mg | Freq: Four times a day (QID) | ORAL | Status: DC | PRN
  Filled 2020-11-01: qty 20

## 2020-11-01 MED ORDER — OXYCODONE HCL 5 MG PO TABS
ORAL_TABLET | ORAL | Status: AC
  Filled 2020-11-01: qty 1

## 2020-11-01 MED ORDER — 0.9 % SODIUM CHLORIDE (POUR BTL) OPTIME
TOPICAL | Status: DC | PRN
  Administered 2020-11-01: 1000 mL

## 2020-11-01 MED ORDER — MEPERIDINE HCL 50 MG/ML IJ SOLN: 6.2500 mg | INTRAMUSCULAR | Status: DC | PRN

## 2020-11-01 MED ORDER — METOPROLOL TARTRATE 50 MG PO TABS
100.0000 mg | ORAL_TABLET | Freq: Two times a day (BID) | ORAL | Status: DC
  Administered 2020-11-01 – 2020-11-04 (×6): 100 mg via ORAL
  Filled 2020-11-01 (×6): qty 2

## 2020-11-01 MED ORDER — FENTANYL CITRATE PF 50 MCG/ML IJ SOSY
50.0000 ug | PREFILLED_SYRINGE | INTRAMUSCULAR | Status: DC
  Filled 2020-11-01: qty 2

## 2020-11-01 MED ORDER — CEFAZOLIN SODIUM-DEXTROSE 2-4 GM/100ML-% IV SOLN
2.0000 g | Freq: Four times a day (QID) | INTRAVENOUS | Status: AC
Start: 2020-11-01 — End: 2020-11-02
  Administered 2020-11-01 – 2020-11-02 (×2): 2 g via INTRAVENOUS
  Filled 2020-11-01 (×2): qty 100

## 2020-11-01 MED ORDER — FENTANYL CITRATE PF 50 MCG/ML IJ SOSY
25.0000 ug | PREFILLED_SYRINGE | INTRAMUSCULAR | Status: DC | PRN
  Administered 2020-11-01 (×2): 50 ug via INTRAVENOUS

## 2020-11-01 MED ORDER — METHOCARBAMOL 1000 MG/10ML IJ SOLN
500.0000 mg | Freq: Four times a day (QID) | INTRAVENOUS | Status: DC | PRN
  Filled 2020-11-01: qty 5

## 2020-11-01 MED ORDER — DEXAMETHASONE SODIUM PHOSPHATE 10 MG/ML IJ SOLN
8.0000 mg | Freq: Once | INTRAMUSCULAR | Status: AC
  Administered 2020-11-01: 8 mg via INTRAVENOUS

## 2020-11-01 MED ORDER — DEXAMETHASONE SODIUM PHOSPHATE 10 MG/ML IJ SOLN
10.0000 mg | Freq: Once | INTRAMUSCULAR | Status: AC
  Administered 2020-11-02: 10 mg via INTRAVENOUS
  Filled 2020-11-01: qty 1

## 2020-11-01 MED ORDER — CEFAZOLIN SODIUM-DEXTROSE 2-4 GM/100ML-% IV SOLN
2.0000 g | INTRAVENOUS | Status: AC
  Administered 2020-11-01: 2 g via INTRAVENOUS
  Filled 2020-11-01: qty 100

## 2020-11-01 MED ORDER — PHENOL 1.4 % MT LIQD: 1.0000 | OROMUCOSAL | Status: DC | PRN

## 2020-11-01 MED ORDER — METHOCARBAMOL 500 MG PO TABS
500.0000 mg | ORAL_TABLET | Freq: Four times a day (QID) | ORAL | Status: DC | PRN
  Administered 2020-11-02 – 2020-11-04 (×4): 500 mg via ORAL
  Filled 2020-11-01 (×4): qty 1

## 2020-11-01 MED ORDER — LIDOCAINE 2% (20 MG/ML) 5 ML SYRINGE
INTRAMUSCULAR | Status: DC | PRN
  Administered 2020-11-01: 40 mg via INTRAVENOUS

## 2020-11-01 MED ORDER — TRAZODONE HCL 100 MG PO TABS
200.0000 mg | ORAL_TABLET | Freq: Every day | ORAL | Status: DC
  Administered 2020-11-01 – 2020-11-03 (×3): 200 mg via ORAL
  Filled 2020-11-01 (×4): qty 2

## 2020-11-01 MED ORDER — OXYCODONE HCL 5 MG PO TABS
5.0000 mg | ORAL_TABLET | ORAL | Status: DC | PRN
  Administered 2020-11-01 – 2020-11-03 (×5): 10 mg via ORAL
  Administered 2020-11-04: 5 mg via ORAL
  Administered 2020-11-04: 10 mg via ORAL
  Filled 2020-11-01 (×6): qty 2

## 2020-11-01 MED ORDER — DIPHENHYDRAMINE HCL 12.5 MG/5ML PO ELIX: 12.5000 mg | ORAL_SOLUTION | ORAL | Status: DC | PRN

## 2020-11-01 MED ORDER — MENTHOL 3 MG MT LOZG: 1.0000 | LOZENGE | OROMUCOSAL | Status: DC | PRN

## 2020-11-01 MED ORDER — ONDANSETRON HCL 4 MG PO TABS: 4.0000 mg | ORAL_TABLET | Freq: Four times a day (QID) | ORAL | Status: DC | PRN

## 2020-11-01 MED ORDER — POVIDONE-IODINE 10 % EX SWAB
2.0000 "application " | Freq: Once | CUTANEOUS | Status: AC
  Administered 2020-11-01: 2 via TOPICAL

## 2020-11-01 MED ORDER — ONDANSETRON HCL 4 MG/2ML IJ SOLN
INTRAMUSCULAR | Status: DC | PRN
Start: 2020-11-01 — End: 2020-11-01
  Administered 2020-11-01: 4 mg via INTRAVENOUS

## 2020-11-01 MED ORDER — POLYETHYLENE GLYCOL 3350 17 G PO PACK: 17.0000 g | PACK | Freq: Every day | ORAL | Status: DC | PRN

## 2020-11-01 MED ORDER — MIDAZOLAM HCL 2 MG/2ML IJ SOLN
1.0000 mg | INTRAMUSCULAR | Status: DC
  Filled 2020-11-01: qty 2

## 2020-11-01 MED ORDER — KETOROLAC TROMETHAMINE 15 MG/ML IJ SOLN
INTRAMUSCULAR | Status: AC
  Administered 2020-11-01: 15 mg via INTRAVENOUS
  Filled 2020-11-01: qty 1

## 2020-11-01 MED ORDER — METOCLOPRAMIDE HCL 5 MG/ML IJ SOLN: 5.0000 mg | Freq: Three times a day (TID) | INTRAMUSCULAR | Status: DC | PRN

## 2020-11-01 MED ORDER — EPHEDRINE 5 MG/ML INJ
INTRAVENOUS | Status: AC
  Filled 2020-11-01: qty 10

## 2020-11-01 MED ORDER — CELECOXIB 200 MG PO CAPS
200.0000 mg | ORAL_CAPSULE | Freq: Two times a day (BID) | ORAL | Status: DC
  Administered 2020-11-01 – 2020-11-04 (×6): 200 mg via ORAL
  Filled 2020-11-01 (×6): qty 1

## 2020-11-01 MED ORDER — VERAPAMIL HCL ER 120 MG PO TBCR
120.0000 mg | EXTENDED_RELEASE_TABLET | Freq: Every morning | ORAL | Status: DC
  Administered 2020-11-02 – 2020-11-04 (×3): 120 mg via ORAL
  Filled 2020-11-01 (×3): qty 1

## 2020-11-01 MED ORDER — FENTANYL CITRATE PF 50 MCG/ML IJ SOSY
PREFILLED_SYRINGE | INTRAMUSCULAR | Status: AC
  Administered 2020-11-01: 50 ug via INTRAVENOUS
  Filled 2020-11-01: qty 3

## 2020-11-01 MED ORDER — SODIUM CHLORIDE 0.9 % IR SOLN
Status: DC | PRN
  Administered 2020-11-01: 3000 mL

## 2020-11-01 MED ORDER — ENALAPRIL MALEATE 10 MG PO TABS
10.0000 mg | ORAL_TABLET | Freq: Every morning | ORAL | Status: DC
  Administered 2020-11-02 – 2020-11-04 (×3): 10 mg via ORAL
  Filled 2020-11-01 (×3): qty 1

## 2020-11-01 MED ORDER — DOCUSATE SODIUM 100 MG PO CAPS
100.0000 mg | ORAL_CAPSULE | Freq: Two times a day (BID) | ORAL | Status: DC
  Administered 2020-11-01 – 2020-11-04 (×6): 100 mg via ORAL
  Filled 2020-11-01 (×6): qty 1

## 2020-11-01 MED ORDER — MIDAZOLAM HCL 2 MG/2ML IJ SOLN
INTRAMUSCULAR | Status: AC
  Filled 2020-11-01: qty 2

## 2020-11-01 MED ORDER — VANCOMYCIN HCL 1000 MG IV SOLR
INTRAVENOUS | Status: DC | PRN
  Administered 2020-11-01: 2000 mg via TOPICAL

## 2020-11-01 MED ORDER — FENTANYL CITRATE (PF) 100 MCG/2ML IJ SOLN
INTRAMUSCULAR | Status: DC | PRN
  Administered 2020-11-01: 50 ug via INTRAVENOUS
  Administered 2020-11-01: 25 ug via INTRAVENOUS
  Administered 2020-11-01 (×3): 50 ug via INTRAVENOUS
  Administered 2020-11-01 (×3): 25 ug via INTRAVENOUS

## 2020-11-01 MED ORDER — PANTOPRAZOLE SODIUM 40 MG PO TBEC
40.0000 mg | DELAYED_RELEASE_TABLET | Freq: Every day | ORAL | Status: DC
  Administered 2020-11-02 – 2020-11-04 (×3): 40 mg via ORAL
  Filled 2020-11-01 (×3): qty 1

## 2020-11-01 MED ORDER — BUPROPION HCL ER (XL) 300 MG PO TB24
300.0000 mg | ORAL_TABLET | Freq: Every morning | ORAL | Status: DC
  Administered 2020-11-02 – 2020-11-04 (×3): 300 mg via ORAL
  Filled 2020-11-01 (×2): qty 1

## 2020-11-01 MED ORDER — MELATONIN 3 MG PO TABS
3.0000 mg | ORAL_TABLET | Freq: Every day | ORAL | Status: DC
  Administered 2020-11-01 – 2020-11-03 (×3): 3 mg via ORAL
  Filled 2020-11-01 (×3): qty 1

## 2020-11-01 MED ORDER — MIDAZOLAM HCL 2 MG/2ML IJ SOLN
INTRAMUSCULAR | Status: DC | PRN
  Administered 2020-11-01: 2 mg via INTRAVENOUS

## 2020-11-01 MED ORDER — OXYCODONE HCL 5 MG PO TABS
10.0000 mg | ORAL_TABLET | ORAL | Status: DC | PRN
  Administered 2020-11-02 – 2020-11-03 (×4): 15 mg via ORAL
  Filled 2020-11-01 (×4): qty 3
  Filled 2020-11-01: qty 2

## 2020-11-01 MED ORDER — CHLORHEXIDINE GLUCONATE 0.12 % MT SOLN
15.0000 mL | Freq: Once | OROMUCOSAL | Status: AC
  Administered 2020-11-01: 15 mL via OROMUCOSAL

## 2020-11-01 MED ORDER — KETOROLAC TROMETHAMINE 15 MG/ML IJ SOLN: 15.0000 mg | Freq: Once | INTRAMUSCULAR | Status: AC

## 2020-11-01 MED ORDER — HYDROMORPHONE HCL 1 MG/ML IJ SOLN
INTRAMUSCULAR | Status: AC
  Administered 2020-11-01: 0.5 mg via INTRAVENOUS
  Filled 2020-11-01: qty 1

## 2020-11-01 SURGICAL SUPPLY — 43 items
BAG COUNTER SPONGE SURGICOUNT (BAG) IMPLANT
BAG ZIPLOCK 12X15 (MISCELLANEOUS) ×2 IMPLANT
BNDG ELASTIC 4X5.8 VLCR STR LF (GAUZE/BANDAGES/DRESSINGS) IMPLANT
BNDG ELASTIC 6X10 VLCR STRL LF (GAUZE/BANDAGES/DRESSINGS) ×2 IMPLANT
BNDG ELASTIC 6X5.8 VLCR STR LF (GAUZE/BANDAGES/DRESSINGS) ×2 IMPLANT
COVER SURGICAL LIGHT HANDLE (MISCELLANEOUS) ×2 IMPLANT
CUFF TOURN SGL QUICK 34 (TOURNIQUET CUFF) ×2
CUFF TRNQT CYL 34X4.125X (TOURNIQUET CUFF) ×1 IMPLANT
DECANTER SPIKE VIAL GLASS SM (MISCELLANEOUS) ×2 IMPLANT
DERMABOND ADVANCED (GAUZE/BANDAGES/DRESSINGS) ×1
DERMABOND ADVANCED .7 DNX12 (GAUZE/BANDAGES/DRESSINGS) ×1 IMPLANT
DRAPE U-SHAPE 47X51 STRL (DRAPES) ×2 IMPLANT
DRSG AQUACEL AG ADV 3.5X10 (GAUZE/BANDAGES/DRESSINGS) IMPLANT
DRSG AQUACEL AG ADV 3.5X14 (GAUZE/BANDAGES/DRESSINGS) ×2 IMPLANT
DRSG PAD ABDOMINAL 8X10 ST (GAUZE/BANDAGES/DRESSINGS) ×2 IMPLANT
DURAPREP 26ML APPLICATOR (WOUND CARE) ×4 IMPLANT
ELECT REM PT RETURN 15FT ADLT (MISCELLANEOUS) ×2 IMPLANT
GLOVE SURG UNDER POLY LF SZ7.5 (GLOVE) ×4 IMPLANT
GOWN STRL REUS W/TWL LRG LVL3 (GOWN DISPOSABLE) ×2 IMPLANT
HANDPIECE INTERPULSE COAX TIP (DISPOSABLE) ×2
INSERT TIB ATTUNE RP SZ8X14 (Insert) ×2 IMPLANT
IRRIGATION SURGIPHOR STRL (IV SOLUTION) ×2 IMPLANT
JET LAVAGE IRRISEPT WOUND (IRRIGATION / IRRIGATOR) ×2
KIT TURNOVER KIT A (KITS) ×2 IMPLANT
LAVAGE JET IRRISEPT WOUND (IRRIGATION / IRRIGATOR) ×1 IMPLANT
MANIFOLD NEPTUNE II (INSTRUMENTS) ×2 IMPLANT
NS IRRIG 1000ML POUR BTL (IV SOLUTION) ×2 IMPLANT
PACK TOTAL KNEE CUSTOM (KITS) ×2 IMPLANT
PADDING CAST COTTON 6X4 STRL (CAST SUPPLIES) ×2 IMPLANT
PENCIL SMOKE EVACUATOR (MISCELLANEOUS) IMPLANT
PROTECTOR NERVE ULNAR (MISCELLANEOUS) ×2 IMPLANT
SET HNDPC FAN SPRY TIP SCT (DISPOSABLE) ×1 IMPLANT
SET PAD KNEE POSITIONER (MISCELLANEOUS) ×2 IMPLANT
STAPLER VISISTAT 35W (STAPLE) IMPLANT
SUT MNCRL AB 3-0 PS2 18 (SUTURE) IMPLANT
SUT STRATAFIX PDS+ 0 24IN (SUTURE) ×2 IMPLANT
SUT VIC AB 1 CT1 36 (SUTURE) ×2 IMPLANT
SUT VIC AB 2-0 CT1 27 (SUTURE) ×6
SUT VIC AB 2-0 CT1 TAPERPNT 27 (SUTURE) ×3 IMPLANT
SWAB COLLECTION DEVICE MRSA (MISCELLANEOUS) IMPLANT
SWAB CULTURE ESWAB REG 1ML (MISCELLANEOUS) IMPLANT
TRAY FOLEY MTR SLVR 16FR STAT (SET/KITS/TRAYS/PACK) ×2 IMPLANT
WRAP KNEE MAXI GEL POST OP (GAUZE/BANDAGES/DRESSINGS) ×2 IMPLANT

## 2020-11-01 NOTE — Brief Op Note (Signed)
11/01/2020  2:07 PM  PATIENT:  Thomas [name]  50 y.o. male  PRE-OPERATIVE DIAGNOSIS:  Status post left total knee arthroplasty revision with recurrent infection  POST-OPERATIVE DIAGNOSIS:  Status post left total knee arthroplasty revision with recurrent infection  PROCEDURE:  Procedure(s): IRRIGATION AND DEBRIDEMENT KNEE WITH POLY EXCHANGE (Left)  SURGEON:  Surgeon(s) and Role:    Durene Romans, MD - Primary  PHYSICIAN ASSISTANT: [name], PA-C  ANESTHESIA:   general  EBL:  <200 cc  BLOOD ADMINISTERED:none  DRAINS: none   LOCAL MEDICATIONS USED:  OTHER Vancomycin powder  SPECIMEN:  Source of Specimen:  left knee fluid and tissue  DISPOSITION OF SPECIMEN:  PATHOLOGY  COUNTS:  YES  TOURNIQUET:  108 min at 250 mmHg  DICTATION: .Other Dictation: Dictation Number [id number]  PLAN OF CARE: Admit to inpatient   PATIENT DISPOSITION:  PACU - hemodynamically stable.   Delay start of Pharmacological VTE agent (>24hrs) due to surgical blood loss or risk of bleeding: no

## 2020-11-01 NOTE — Anesthesia Procedure Notes (Signed)
Procedure Name: LMA Insertion Date/Time: 11/01/2020 2:46 PM Performed by: Sindy Guadeloupe, CRNA Pre-anesthesia Checklist: Patient identified, Emergency Drugs available, Suction available, Patient being monitored and Timeout performed Patient Re-evaluated:Patient Re-evaluated prior to induction Oxygen Delivery Method: Circle system utilized Preoxygenation: Pre-oxygenation with 100% oxygen Induction Type: IV induction Ventilation: Mask ventilation without difficulty LMA: LMA inserted LMA Size: 4.0 Number of attempts: 1 Tube secured with: Tape Dental Injury: Teeth and Oropharynx as per pre-operative assessment

## 2020-11-01 NOTE — Anesthesia Postprocedure Evaluation (Signed)
Anesthesia Post Note  Patient: Thomas [name]  Procedure([name]) Performed: IRRIGATION AND DEBRIDEMENT KNEE WITH POLY EXCHANGE (Left: Knee)     Patient location during evaluation: PACU Anesthesia Type: General Level of consciousness: awake and alert Pain management: pain level controlled Vital Signs Assessment: post-procedure vital signs reviewed and stable Respiratory status: spontaneous breathing, nonlabored ventilation, respiratory function stable and patient connected to nasal cannula oxygen Cardiovascular status: blood pressure returned to baseline and stable Postop Assessment: no apparent nausea or vomiting Anesthetic complications: no   No notable events documented.  Last Vitals:  Vitals:   11/01/20 1219  BP: 139/80  Pulse: 66  Resp: 18  Temp: 36.9 C  SpO2: 96%    Last Pain:  Vitals:   11/01/20 1219  TempSrc: Oral  PainSc:                  Thomas [name]

## 2020-11-01 NOTE — Progress Notes (Signed)
Pre-op sheet noted in RT department for patient using CPAP at home. Order placed per protocol. Visited with Thomas [name] and talked about his CPAP needs. He conveys to me that two weeks ago he had a visit with his sleep doctor and was informed that "wearing it does more harm than good" and was instructed to stop home use until a follow up sleep study has been done. Patient educated on what CPAP does and what happens in the home environment if pressures are too high or too low. Offered to place on auto titration with high upper limits and start at a low pressure but patient declines at this time. Equipment has been left at the bedside in the event that he has apnea complications during the night. He agrees to follow through with a new mode of CPAP if he has complications. Otherwise, he declines CPAP use. RT will continue to follow and encourage use.

## 2020-11-01 NOTE — Interval H&P Note (Signed)
History and Physical Interval Note:  11/01/2020 1:23 PM  Thomas [name]  has presented today for surgery, with the diagnosis of Status post left total knee arthroplasty revision.  The various methods of treatment have been discussed with the patient and family. After consideration of risks, benefits and other options for treatment, the patient has consented to  Procedure(s): IRRIGATION AND DEBRIDEMENT KNEE WITH POLY EXCHANGE (Left) as a surgical intervention.  The patient's history has been reviewed, patient examined, no change in status, stable for surgery.  I have reviewed the patient's chart and labs.  Questions were answered to the patient's satisfaction.     [name]

## 2020-11-01 NOTE — Discharge Instructions (Signed)

## 2020-11-01 NOTE — Anesthesia Preprocedure Evaluation (Addendum)
Anesthesia Evaluation  Patient identified by MRN, date of birth, ID band Patient awake    Reviewed: Allergy & Precautions, NPO status , Patient's Chart, lab work & pertinent test results, reviewed documented beta blocker date and time   Airway Mallampati: I       Dental no notable dental hx.    Pulmonary sleep apnea and Continuous Positive Airway Pressure Ventilation ,    Pulmonary exam normal        Cardiovascular hypertension, Pt. on medications and Pt. on home beta blockers Normal cardiovascular exam+ dysrhythmias Supra Ventricular Tachycardia  Rhythm:Regular Rate:Normal     Neuro/Psych PSYCHIATRIC DISORDERS Anxiety    GI/Hepatic Neg liver ROS, GERD  Medicated and Controlled,  Endo/Other  negative endocrine ROS  Renal/GU negative Renal ROS  negative genitourinary   Musculoskeletal   Abdominal (+) + obese,   Peds  Hematology negative hematology ROS (+)   Anesthesia Other Findings   Reproductive/Obstetrics                            Anesthesia Physical Anesthesia Plan  ASA: 2  Anesthesia Plan: General   Post-op Pain Management:    Induction: Intravenous  PONV Risk Score and Plan: 3 and Ondansetron and Midazolam  Airway Management Planned: LMA  Additional Equipment: None  Intra-op Plan:   Post-operative Plan: Extubation in OR  Informed Consent: I have reviewed the patients History and Physical, chart, labs and discussed the procedure including the risks, benefits and alternatives for the proposed anesthesia with the patient or authorized representative who has indicated his/her understanding and acceptance.     Dental advisory given  Plan Discussed with: CRNA  Anesthesia Plan Comments:         Anesthesia Quick Evaluation

## 2020-11-01 NOTE — Transfer of Care (Signed)
Immediate Anesthesia Transfer of Care Note  Patient: [name]  Procedure(s) Performed: IRRIGATION AND DEBRIDEMENT KNEE WITH POLY EXCHANGE (Left: Knee)  Patient Location: PACU  Anesthesia Type:General  Level of Consciousness: awake  Airway & Oxygen Therapy: Patient Spontanous Breathing and Patient connected to face mask oxygen  Post-op Assessment: Report given to RN and Post -op Vital signs reviewed and stable  Post vital signs: Reviewed and stable  Last Vitals:  Vitals Value Taken Time  BP 154/92 11/01/20 1706  Temp    Pulse 83 11/01/20 1709  Resp 15 11/01/20 1709  SpO2 95 % 11/01/20 1709  Vitals shown include unvalidated device data.  Last Pain:  Vitals:   11/01/20 1219  TempSrc: Oral  PainSc:          Complications: No notable events documented.

## 2020-11-02 DIAGNOSIS — E669 Obesity, unspecified: Secondary | ICD-10-CM | POA: Diagnosis not present

## 2020-11-02 DIAGNOSIS — Z96652 Presence of left artificial knee joint: Secondary | ICD-10-CM

## 2020-11-02 DIAGNOSIS — B957 Other staphylococcus as the cause of diseases classified elsewhere: Secondary | ICD-10-CM | POA: Diagnosis not present

## 2020-11-02 DIAGNOSIS — Z6835 Body mass index (BMI) 35.0-35.9, adult: Secondary | ICD-10-CM

## 2020-11-02 DIAGNOSIS — M00062 Staphylococcal arthritis, left knee: Secondary | ICD-10-CM | POA: Diagnosis not present

## 2020-11-02 DIAGNOSIS — I10 Essential (primary) hypertension: Secondary | ICD-10-CM

## 2020-11-02 LAB — CBC
HCT: 37.2 % — ABNORMAL LOW (ref 39.0–52.0)
Hemoglobin: 11.9 g/dL — ABNORMAL LOW (ref 13.0–17.0)
MCH: 26.7 pg (ref 26.0–34.0)
MCHC: 32 g/dL (ref 30.0–36.0)
MCV: 83.6 fL (ref 80.0–100.0)
Platelets: 201 10*3/uL (ref 150–400)
RBC: 4.45 MIL/uL (ref 4.22–5.81)
RDW: 14.4 % (ref 11.5–15.5)
WBC: 9.5 10*3/uL (ref 4.0–10.5)
nRBC: 0 % (ref 0.0–0.2)

## 2020-11-02 LAB — BASIC METABOLIC PANEL
Anion gap: 8 (ref 5–15)
BUN: 11 mg/dL (ref 6–20)
CO2: 27 mmol/L (ref 22–32)
Calcium: 8.8 mg/dL — ABNORMAL LOW (ref 8.9–10.3)
Chloride: 103 mmol/L (ref 98–111)
Creatinine, Ser: 0.77 mg/dL (ref 0.61–1.24)
GFR, Estimated: >60 mL/min (ref >60)
Glucose, Bld: 235 mg/dL — ABNORMAL HIGH (ref 70–99)
Potassium: 4.3 mmol/L (ref 3.5–5.1)
Sodium: 138 mmol/L (ref 135–145)

## 2020-11-02 MED ORDER — CHLORHEXIDINE GLUCONATE CLOTH 2 % EX PADS
6.0000 | MEDICATED_PAD | Freq: Every day | CUTANEOUS | Status: DC
  Administered 2020-11-02 – 2020-11-04 (×3): 6 via TOPICAL

## 2020-11-02 MED ORDER — SODIUM CHLORIDE 0.9% FLUSH
10.0000 mL | INTRAVENOUS | Status: DC | PRN
  Administered 2020-11-04: 10 mL

## 2020-11-02 MED ORDER — SODIUM CHLORIDE 0.9 % IV SOLN
8.0000 mg/kg | Freq: Every day | INTRAVENOUS | Status: DC
  Administered 2020-11-02 – 2020-11-03 (×2): 950 mg via INTRAVENOUS
  Filled 2020-11-02 (×4): qty 19

## 2020-11-02 MED ORDER — SODIUM CHLORIDE 0.9 % IV SOLN
8.0000 mg/kg | Freq: Every day | INTRAVENOUS | Status: DC
  Filled 2020-11-02: qty 19

## 2020-11-02 MED ORDER — SODIUM CHLORIDE 0.9% FLUSH
10.0000 mL | Freq: Two times a day (BID) | INTRAVENOUS | Status: DC
Start: 2020-11-02 — End: 2020-11-04
  Administered 2020-11-03: 10 mL

## 2020-11-02 NOTE — Progress Notes (Signed)
Patient ID: Thomas [name], male   DOB: 03-25-1970, 50 y.o.   MRN: [id number] Subjective: 1 Day Post-Op Procedure(s) (LRB): IRRIGATION AND DEBRIDEMENT KNEE WITH POLY EXCHANGE (Left)    Patient reports pain as mild.  Wasn't able to get much sleep last night, just wired he says, not a pain issue  Objective:   VITALS:   Vitals:   11/02/20 0127 11/02/20 0638  BP: 127/78 (!) 147/94  Pulse: 92 86  Resp: 18 18  Temp: 97.6 F (36.4 C) (!) 97.5 F (36.4 C)  SpO2: 95% 94%    Neurovascular intact Incision: dressing C/D/I - bulky left knee dressing intact  LABS Recent Labs    11/02/20 0420  HGB 11.9*  HCT 37.2*  WBC 9.5  PLT 201    Recent Labs    11/02/20 0420  NA 138  K 4.3  BUN 11  CREATININE 0.77  GLUCOSE 235*    No results for input(s): LABPT, INR in the last 72 hours.   Assessment/Plan: 1 Day Post-Op Procedure(s) (LRB): IRRIGATION AND DEBRIDEMENT KNEE WITH POLY EXCHANGE (Left)   Advance diet Up with therapy Continue ABX therapy due to septic left TKRevsion  Hopeful for PIC today Will contact ID for assistance Hopeful D/C tomorrow if all can be arranged Activity - WBAT LLE with walker for post op support

## 2020-11-02 NOTE — Op Note (Signed)
[name], [name] MEDICAL RECORD NO: [id number] ACCOUNT NO: [id number] DATE OF BIRTH: 09/29/1970 FACILITY: [facility] LOCATION: [id number] PHYSICIAN: Thomas Frankel. Charlann Boxer, MD  Operative Report   DATE OF PROCEDURE: 11/01/2020  PREOPERATIVE DIAGNOSIS:  Recurrent infection, left knee following two-stage treatment and placement of revised femoral and tibial components.  POSTOPERATIVE DIAGNOSIS:  Recurrent infection, left knee following two-stage treatment and placement of revised femoral and tibial components.  PROCEDURE: 1.  Excisional and non-excisional debridement of left knee.  See dictated operative note for details and explanations of each. 2.  Revision polyethylene, removing the old polyethylene and revising with a size 8 x 14 mm posterior stabilized insert.  SURGEON:  Thomas Romans, MD  ASSISTANT Thomas Billings, PA-C.  Note that Ms. [name] was present for the entirety of the case from preoperative positioning, perioperative management of the operative extremity and general facilitation of case and primary wound closure.  ANESTHESIA:  General.  BLOOD LOSS:  Less than 200 mL as a tourniquet was utilized.  Tourniquet was up for 108 minutes at 250 mmHg.  DRAINS: None.  Several specimens including joint fluid as well as tissue was sent to pathology for evaluation of Gram stain and culture.  LOCAL AGENTS:  I did place 2 grams of vancomycin powder into the wound as we were closing.  INDICATIONS:  The patient is a pleasant 50 year old male with an unfortunate history involving his left knee with multiple surgical procedures.  He most recently had issues complicated by infection requiring resection of a total knee replacement followed  by treatment with IV antibiotics.  He had his knee reimplanted and was on suppressive doxycycline.  He presented to the office 2 weeks ago with increasing concerns for pain and erythema to his left knee with swelling.  Radiographs revealed stable  components.  I had a  lengthy discussion with the patient regarding the condition of his knee.  Unfortunately, the gold standard treatment for his knee would be to resect his implants.  However, I have significant concerns regarding the morbidity  associated with removing particularly the press-fit tibial component.  Given these concerns, I recommended that we try an excisional and non-excisional debridement of the left knee with IV antibiotics for 5 to 6 weeks followed by suppressive antibiotic  course.  We reviewed the risk of recurrent infection, DVT, component failure, need for future surgeries.  This does represent an extremely complex situation as has been outlined with the patient.  Surgery was scheduled and consent obtained for benefit of  management of his infection.  PROCEDURE IN DETAIL:  The patient was brought to the operative theater.  Once adequate anesthesia was administered, antibiotics were initially held until we were able to get tissue and fluid samples from the knee.  Additionally, tranexamic acid was  administered.  A left thigh tourniquet was placed.  The left lower extremity was then prepped and draped in sterile fashion.  A timeout was performed identifying the patient, planned procedure, and extremity.  The leg was exsanguinated and tourniquet  elevated to 250 mmHg.  We used his old incision, excising a bit that had widened.  There was no evidence of any wound breakdown at this point.  Soft tissue planes were created.  This represented a significant challenge related to the amount of scar  tissue present in this knee.  I tried to carefully elevate the soft tissues from the subdermal layer.  Once I felt that I had this layer exposed, I made a median arthrotomy.  Here, we  encountered already abnormal-appearing tissue.  There was no evidence  of pus; however, there was significantly inflamed and likely infected tissue.  During this time, I have now sharply excised with a knife the skin as well as nonviable  tissue in this subdermal layer.  We then made an arthrotomy using the Bovie cautery.   At this point, I spent the greater portion of the case in an extensive synovectomy and scar debridement including the medial, lateral gutters, suprapatellar pouch.  I was removing all nonviable and unhealthy inflamed or infected type tissue.  We did send  fluid off as well as tissue samples for evaluation by pathology.  Once I felt that I had extensively debrided this knee, we irrigated the knee with 3 liters of normal saline solution.  I removed the old polyethylene and was able to debride the posterior  aspect of the knee.  Once we did this, we irrigated the knee with Betadine solution and held it in the knee for approximately 3 minutes.  This was followed by the use of IrriSept, chlorhexidine base solution for 3 minutes.  Once this was done and the  knee had now been excised and debrided as well as irrigated, we changed our surgical gloves and opened up a new polyethylene insert and a size 8 x 14 posterior stabilized insert.  This was then placed in the knee.  The knee was then re-irrigated with  another 3 liters of normal saline solution.  The extensor mechanism was then reapproximated with #1 Vicryl and #1 Stratafix suture.  The medial aspect of his retinacular tissue adjacent to the patella was pretty weak related to issues pertaining to the  infection.  The extensor mechanism was reapproximated using #1 Vicryl and #1 Stratafix suture.  The remainder of the wound was closed with 2-0 Vicryl and a running Monocryl stitch.  The knee was then cleaned, dried and dressed sterilely using surgical  glue and Aquacel dressing.  The knee was then wrapped in a bulky dressing until applied compression.  The tourniquet was not let down until we ended the case.  Please note that within the deep layer as well as the superficial layer, we sprinkled in combined 2 grams of vancomycin powder.  The patient was then awoken from  anesthesia and brought to the recovery room in stable condition.  Findings were reviewed with his wife.  During this hospitalization, we will get a PICC line placed and start him on antibiotics, hopefully with some  speciation and identification from pathology to assist.   SHW D: 11/01/2020 6:24:43 pm T: 11/02/2020 12:50:00 am  JOB: [id number]/ [id number]

## 2020-11-02 NOTE — Progress Notes (Signed)
Peripherally Inserted Central Catheter Placement  The IV Nurse has discussed with the patient and/or persons authorized to consent for the patient, the purpose of this procedure and the potential benefits and risks involved with this procedure.  The benefits include less needle sticks, lab draws from the catheter, and the patient may be discharged home with the catheter. Risks include, but not limited to, infection, bleeding, blood clot (thrombus formation), and puncture of an artery; nerve damage and irregular heartbeat and possibility to perform a PICC exchange if needed/ordered by physician.  Alternatives to this procedure were also discussed.  Bard Power PICC patient education guide, fact sheet on infection prevention and patient information card has been provided to patient /or left at bedside.    PICC Placement Documentation  PICC Single Lumen 11/02/20 Right Basilic 42 cm 0 cm (Active)  Indication for Insertion or Continuance of Line Home intravenous therapies (PICC only) 11/02/20 1000  Exposed Catheter (cm) 0 cm 11/02/20 1000  Site Assessment Clean;Dry;Intact 11/02/20 1000  Line Status Flushed;Saline locked;Blood return noted 11/02/20 1000  Dressing Type Transparent;Securing device 11/02/20 1000  Dressing Status Clean;Dry;Intact 11/02/20 1000  Antimicrobial disc in place? Yes 11/02/20 1000  Safety Lock Not Applicable 11/02/20 1000  Line Care Connections checked and tightened 11/02/20 1000  Dressing Intervention New dressing 11/02/20 1000  Dressing Change Due 11/09/20 11/02/20 1000       Franne Grip Renee 11/02/2020, 10:28 AM

## 2020-11-02 NOTE — Progress Notes (Signed)
Patient continues to decline nocturnal PAP based on his last visit with his sleep doctor. Equipment pulled from room.

## 2020-11-02 NOTE — TOC Initial Note (Signed)
Transition of Care [facility]) - Initial/Assessment Note   Patient Details  Name: Thomas [name] MRN: [id number] Date of Birth: 08-15-1970  Transition of Care [facility]) CM/SW Contact:    Ewing Schlein, LCSW Phone Number: 11/02/2020, 2:13 PM  Clinical Narrative: Patient will need to discharge home on IV antibiotics and HHRN. CSW spoke with patient and explained IV antibiotics and HHRN will need to be set up prior to discharge. Referral for IV antibiotics made to [facility] with [facility]. Per Pam, the [facility] will require a consult note from ID in order for services to be approved. TOC to follow.  Expected Discharge Plan: Home w Home Health Services Barriers to Discharge: Continued Medical Work up  Patient Goals and CMS Choice Patient states their goals for this hospitalization and ongoing recovery are:: Return home CMS Medicare.gov Compare Post Acute Care list provided to:: Patient Choice offered to / list presented to : Patient  Expected Discharge Plan and Services Expected Discharge Plan: Home w Home Health Services In-house Referral: Clinical Social Work Post Acute Care Choice: Home Health Living arrangements for the past 2 months: Single Family Home            DME Arranged: N/A DME Agency: NA HH Arranged: IV Antibiotics HH Agency: [facility] Date HH Agency Contacted: 11/02/20 Representative spoke with at [facility] Agency: Pam  Prior Living Arrangements/Services Living arrangements for the past 2 months: Single Family Home Lives with:: Spouse Patient language and need for interpreter reviewed:: Yes Do you feel safe going back to the place where you live?: Yes      Need for Family Participation in Patient Care: No (Comment) Care giver support system in place?: Yes (comment) Criminal Activity/Legal Involvement Pertinent to Current Situation/Hospitalization: No - Comment as needed  Activities of Daily Living Home Assistive Devices/Equipment: Eyeglasses, Environmental consultant (specify type), Cane (specify quad or straight),  Bedside commode/3-in-1, Grab bars around toilet, Grab bars in shower, Raised toilet seat with rails, CPAP ADL Screening (condition at time of admission) Patient's cognitive ability adequate to safely complete daily activities?: Yes Is the patient deaf or have difficulty hearing?: No Does the patient have difficulty seeing, even when wearing glasses/contacts?: No Does the patient have difficulty concentrating, remembering, or making decisions?: No Patient able to express need for assistance with ADLs?: Yes Does the patient have difficulty dressing or bathing?: No Independently performs ADLs?: Yes (appropriate for developmental age) Does the patient have difficulty walking or climbing stairs?: No Weakness of Legs: Left Weakness of Arms/Hands: None  Permission Sought/Granted Permission sought to share information with : Facility Industrial/product designer granted to share information with : Yes, Verbal Permission Granted Permission granted to share info w AGENCY: [employer], HH agencies  Emotional Assessment Appearance:: Appears stated age Attitude/Demeanor/Rapport: Engaged Affect (typically observed): Accepting Orientation: : Oriented to Self, Oriented to Place, Oriented to  Time, Oriented to Situation Alcohol / Substance Use: Not Applicable Psych Involvement: No (comment)  Admission diagnosis:  S/P revision of total knee, 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.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] Patient Active Problem List   Diagnosis Date Noted   Morbid obesity (HCC) 12/27/2018   S/P left total knee reimplantation 12/26/2018   Left TK resection with abx spacer 09/17/2018   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 [place] [address] Phone: [phone] Fax: [phone]  Readmission Risk Interventions No flowsheet data found.

## 2020-11-02 NOTE — Evaluation (Signed)
Physical Therapy Evaluation Patient Details Name: Thomas [name] MRN: [id number] DOB: [birth year] Today's Date: 11/02/2020   History of Present Illness  50 y.o. male, has a complex history with his left knee. He underwent resection of his left total knee in July 2020, and subsequently had the knee reimplanted  in October 2020 following a course of IV antibiotics. Since that time, he has been on oral suppressive medication with Keflex, and has not shown signs of infection. He presented to ortho office on 10/15/20 with reports of increased swelling, warmth, and erythema. Pt is s/p L knee I & D with poly exchange 11/01/20.  Clinical Impression  Pt admitted with above diagnosis. Pt ambulated 250' without an assistive device, no loss of balance. 6/10 L knee pain. Reviewed HEP with pt. He declined stair training as he's had many prior L knee surgeries and recalls the correct technique.  He is safe to ambulate independently in the hall and was encouraged to do so 3x/day as tolerated.  Pt currently with functional limitations due to the deficits listed below (see PT Problem List). Pt will benefit from skilled PT to increase their independence and safety with mobility to allow discharge to the venue listed below.       Follow Up Recommendations Follow surgeon's recommendation for DC plan and follow-up therapies    Equipment Recommendations  None recommended by PT    Recommendations for Other Services       Precautions / Restrictions Precautions Precautions: Knee Precaution Booklet Issued: Yes (comment) Precaution Comments: reviewed no pillow under knee Restrictions Weight Bearing Restrictions: No LLE Weight Bearing: Weight bearing as tolerated      Mobility  Bed Mobility Overal bed mobility: Independent                  Transfers Overall transfer level: Independent                  Ambulation/Gait Ambulation/Gait assistance: Independent Gait Distance (Feet): 250  Feet Assistive device: None Gait Pattern/deviations: WFL(Within Functional Limits) Gait velocity: WFL   General Gait Details: steady, no loss of balance  Stairs            Wheelchair Mobility    Modified Rankin (Stroke Patients Only)       Balance Overall balance assessment: Independent                                           Pertinent Vitals/Pain Pain Assessment: 0-10 Pain Score: 6  Pain Location: L knee Pain Descriptors / Indicators: Sore Pain Intervention(s): Limited activity within patient's tolerance;Monitored during session;Premedicated before session;Ice applied    Home Living Family/patient expects to be discharged to:: Private residence Living Arrangements: Spouse/significant other Available Help at Discharge: Family Type of Home: House Home Access: Stairs to enter Entrance Stairs-Rails: Right Entrance Stairs-Number of Steps: 2 Home Layout: One level Home Equipment: Environmental consultant - 2 wheels;Cane - single point;Bedside commode;Shower seat;Crutches      Prior Function Level of Independence: Independent with assistive device(s)         Comments: used SPC for longer walks     Hand Dominance        Extremity/Trunk Assessment   Upper Extremity Assessment Upper Extremity Assessment: Overall WFL for tasks assessed    Lower Extremity Assessment Lower Extremity Assessment: LLE deficits/detail LLE Deficits / Details: SLR 3/5, knee AAROM ~5-50* LLE Sensation:  WNL LLE Coordination: WNL    Cervical / Trunk Assessment Cervical / Trunk Assessment: Normal  Communication   Communication: No difficulties  Cognition Arousal/Alertness: Awake/alert Behavior During Therapy: WFL for tasks assessed/performed Overall Cognitive Status: Within Functional Limits for tasks assessed                                        General Comments      Exercises Total Joint Exercises Ankle Circles/Pumps: AROM;Both;10 reps;Supine Quad  Sets: AROM;Left;5 reps;Supine Short Arc Quad: AROM;Left;10 reps;Supine Heel Slides: AAROM;Left;10 reps;Supine Hip ABduction/ADduction: AROM;Left;10 reps;Supine Straight Leg Raises: AROM;Left;5 reps;Supine Long Arc Quad: AROM;Left;10 reps;Seated Knee Flexion: AROM;AAROM;Left;10 reps;Seated Goniometric ROM: 5-50* AAROM L knee   Assessment/Plan    PT Assessment Patient needs continued PT services  PT Problem List Decreased strength;Decreased range of motion;Decreased activity tolerance;Pain       PT Treatment Interventions Therapeutic exercise;Patient/family education    PT Goals (Current goals can be found in the Care Plan section)  Acute Rehab PT Goals Patient Stated Goal: to walk longer distances, save his leg PT Goal Formulation: With patient Time For Goal Achievement: 11/09/20 Potential to Achieve Goals: Good    Frequency 7X/week   Barriers to discharge        Co-evaluation               AM-PAC PT "6 Clicks" Mobility  Outcome Measure Help needed turning from your back to your side while in a flat bed without using bedrails?: None Help needed moving from lying on your back to sitting on the side of a flat bed without using bedrails?: None Help needed moving to and from a bed to a chair (including a wheelchair)?: None Help needed standing up from a chair using your arms (e.g., wheelchair or bedside chair)?: None Help needed to walk in hospital room?: None Help needed climbing 3-5 steps with a railing? : None 6 Click Score: 24    End of Session   Activity Tolerance: Patient tolerated treatment well Patient left: in bed;with call bell/phone within reach Nurse Communication: Mobility status PT Visit Diagnosis: Pain;Muscle weakness (generalized) (M62.81) Pain - Right/Left: Left Pain - part of body: Knee    Time: 3267-1245 PT Time Calculation (min) (ACUTE ONLY): 26 min   Charges:   PT Evaluation $PT Eval Low Complexity: 1 Low         Tamala Ser PT 11/02/2020  Acute Rehabilitation Services Pager [phone] Office [phone]

## 2020-11-02 NOTE — Consult Note (Signed)
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 Check CPK level baseline Follow-up intraoperative cultures He will need 6 to 8 weeks of IV antibiotics followed by oral antibiotics to get to 6 months if not a year of therapy  Unfortunately if MRSE is the culprite again with same sensi pattern PO abnxt may get difficult for him since the only relatively inexpensive antibiotic that is active against his prior MR SE is clindamycin with its high risk of causing C. difficile colitis.  Other antibiotics we could consider including Tedizolid and Omadacycline may be difficult to get coverage for Check baseline sedimentation and CRP   Principal Problem:   S/P revision of total knee, left   Scheduled Meds:  aspirin  81 mg Oral BID   buPROPion  300 mg Oral q AM   celecoxib  200 mg Oral BID   Chlorhexidine Gluconate Cloth  6 each Topical Daily   docusate sodium  100 mg Oral BID   DULoxetine  60 mg Oral q AM   enalapril  10 mg Oral q AM   ferrous sulfate  325 mg Oral TID PC   hydrOXYzine  25 mg Oral Q supper   hydrOXYzine  50 mg Oral QHS   melatonin  3 mg Oral QHS   metoprolol tartrate  100 mg Oral BID   pantoprazole  40 mg Oral Daily   sodium chloride flush  10-40 mL Intracatheter Q12H   traZODone  200 mg Oral QHS   verapamil  120 mg Oral q AM   Continuous Infusions:  sodium chloride 75 mL/hr at 11/02/20 1203   DAPTOmycin (CUBICIN)  IV 950 mg (11/02/20 1537)    methocarbamol (ROBAXIN) IV     PRN Meds:.bisacodyl, diphenhydrAMINE, HYDROmorphone (DILAUDID) injection, menthol-cetylpyridinium **OR** phenol, methocarbamol **OR** methocarbamol (ROBAXIN) IV, metoCLOPramide **OR** metoCLOPramide (REGLAN) injection, ondansetron **OR** ondansetron (ZOFRAN) IV, oxyCODONE, oxyCODONE, polyethylene glycol, sodium chloride flush  HPI: Thomas [name] is a 50 y.o. male veteran with history of hypertension osteoarthritis who underwent left total knee replacement.  Unfortunately his left prosthetic knee became infected in 2020 requiring I&D and removal of prosthetic components and placement of an antibiotic spacer on September 17, 2018.  Methicillin-resistant Staph epidermidis resistant to tetracycline and Bactrim was isolated from culture.  He received at least 6 weeks of IV vancomycin under the direction of my former partner Dr. Lorenso CourierPowers.  It appears that he had difficulty achieving therapeutic levels of vancomycin while on this regimen.  His inflammatory markers eventually normalized any underwent reimplantation of prosthetic knee.  He tells me that he has been on postoperative cephalexin though Dr Nilsa Nuttinglin's note indicated doxycline ever since then.  He presented to Dr. Nilsa Nuttinglin's office 2 weeks prior to admission with increasing pain and erythema in his left knee with swelling.  Radiographs that showed stable components. Dr Charlann Boxerlin was concerned about morbidity of another two-stage surgical approach.  The patient was taken the operating yesterday and he underwent I&D  of his left knee with revision of polyethylene removal old polyethylene and insertion of a new polyethylene lining.  Intraoperative cultures have been obtained with gram stain showing gram-positive cocci  I am concerned that the culprit organism is again the methicillin-resistant Staph epidermidis that was tetracycline Bactrim resistant.  If true his chronic oral cephalexin (or if it was doxycyline would have been ineffective).  For  now I plan on switching him over to IV daptomycin which can be given once daily.  He is planning on going to [place] in early October when he would still be in the midst of completing a 6-week course of parenteral therapy.  I think once a day daptomycin will be easier to accommodate this trip but also would be an easier antibiotic to give him with therapeutic dosing.  Would also avoid the nephrotoxicity risks associated with vancomycin.  My greater concern is how to effectively give him chronic oral antibiotics given that the organism that he had before was resistant to tetracycline Bactrim and cephalosporins and penicillins.  The only routinely affordable antibiotic available will be clindamycin with high risk of causing  c difficile colitis. We could try to obtaine tedizolid or omadacycline to avoid such risk but would need approval from [place] clearly  I spent 82 minutes with the patient including greater than 50% of time in face to face counseling of the patient regarding the nature of prosthetic joint infections and the specific organism that can be involved including the prior organism he had involved with his knee, personally reviewing culture data from July 2020 to present date along with CMP, CBC w differential in  review of medical records before and during the visit and in coordination of his care.    Review of Systems: Review of Systems  Constitutional:  Negative for chills, diaphoresis, fever, malaise/fatigue and weight loss.  HENT:  Negative for congestion, hearing loss, sore throat and tinnitus.   Eyes:  Negative for blurred vision and double vision.  Respiratory:  Negative for cough, sputum production, shortness of breath and wheezing.   Cardiovascular:  Negative for chest pain, palpitations and leg swelling.  Gastrointestinal:  Negative for abdominal pain, blood in stool, constipation, diarrhea, heartburn, melena, nausea and vomiting.  Genitourinary:  Negative for  dysuria, flank pain and hematuria.  Musculoskeletal:  Positive for joint pain and myalgias. Negative for back pain and falls.  Skin:  Negative for itching and rash.  Neurological:  Negative for dizziness, sensory change, focal weakness, loss of consciousness, weakness and headaches.  Endo/Heme/Allergies:  Does not bruise/bleed easily.  Psychiatric/Behavioral:  Negative for depression, memory loss and suicidal ideas. The patient is not nervous/anxious.    Past Medical History:  Diagnosis Date   Anxiety    occasionally   Arthritis    Chest pain    Dysrhythmia    SVT   GERD (gastroesophageal reflux disease)    HTN (hypertension)    Pre-diabetes    Sleep apnea    cpap    SVT (supraventricular tachycardia) (HCC)     Social History   Tobacco Use   Smoking status: Never   Smokeless tobacco: Former    Types: Chew, Snuff    Quit date: 2019   Tobacco comments:    30 years ago   Vaping Use   Vaping Use: Never used  Substance Use Topics   Alcohol use: Not Currently    Alcohol/week: 12.0 standard drinks    Types: 12 Standard drinks or equivalent per week  Comment: no   Drug use: No    History reviewed. No pertinent family history. Allergies  Allergen Reactions   Tylenol [Acetaminophen] Other (See Comments)    Can't have because of liver issues    OBJECTIVE: Blood pressure (!) 145/75, pulse 79, temperature 97.9 F (36.6 C), temperature source Oral, resp. rate 14, height  (1.778 m), weight 116.6 kg, SpO2 94 %.  Physical Exam Constitutional:      Appearance: He is well-developed.  HENT:     Head: Normocephalic and atraumatic.  Eyes:     Conjunctiva/sclera: Conjunctivae normal.  Cardiovascular:     Rate and Rhythm: Normal rate and regular rhythm.  Pulmonary:     Effort: Pulmonary effort is normal. No respiratory distress.     Breath sounds: No wheezing.  Abdominal:     General: There is no distension.     Palpations: Abdomen is soft.  Musculoskeletal:      Cervical back: Normal range of motion and neck supple.  Skin:    General: Skin is warm and dry.     Coloration: Skin is not pale.     Findings: No erythema or rash.  Neurological:     General: No focal deficit present.     Mental Status: He is alert and oriented to person, place, and time.  Psychiatric:        Mood and Affect: Mood normal.        Behavior: Behavior normal.        Thought Content: Thought content normal.        Judgment: Judgment normal.   Left knee bandaged  Lab Results Lab Results  Component Value Date   WBC 9.5 11/02/2020   HGB 11.9 (L) 11/02/2020   HCT 37.2 (L) 11/02/2020   MCV 83.6 11/02/2020   PLT 201 11/02/2020    Lab Results  Component Value Date   CREATININE 0.77 11/02/2020   BUN 11 11/02/2020   NA 138 11/02/2020   K 4.3 11/02/2020   CL 103 11/02/2020   CO2 27 11/02/2020    Lab Results  Component Value Date   ALT 28 09/20/2017   AST 27 09/20/2017   ALKPHOS 70 09/20/2017   BILITOT 0.5 09/20/2017     Microbiology: Recent Results (from the past 240 hour(s))  SARS Coronavirus 2 (TAT 6-24 hrs)     Status: None   Collection Time: 10/28/20 12:00 AM  Result Value Ref Range Status   SARS Coronavirus 2 RESULT: NEGATIVE  Final    Comment: RESULT: NEGATIVESARS-CoV-2 INTERPRETATION:A NEGATIVE  test result means that SARS-CoV-2 RNA was not present in the specimen above the limit of detection of this test. This does not preclude a possible SARS-CoV-2 infection and should not be used as the  sole basis for patient management decisions. Negative results must be combined with clinical observations, patient history, and epidemiological information. Optimum specimen types and timing for peak viral levels during infections caused by SARS-CoV-2  have not been determined. Collection of multiple specimens or types of specimens may be necessary to detect virus. Improper specimen collection and handling, sequence variability under primers/probes, or organism present  below the limit of detection may  lead to false negative results. Positive and negative predictive values of testing are highly dependent on prevalence. False negative test results are more likely when prevalence of disease is high.The expected result is NEGATIVE.Fact S heet for  Healthcare Providers: [employer]s.gl Sheet for Patients: https://poole-freeman.org/ Reference Range - Negative   Aerobic/Anaerobic Culture w  Gram Stain (surgical/deep wound)     Status: None (Preliminary result)   Collection Time: 11/01/20  3:10 PM   Specimen: Soft Tissue, Other  Result Value Ref Range Status   Specimen Description   Final    KNEE LEFT SUPERFICIAL Performed at [facility], [address] [place], [place] [address]    Special Requests   Final    NONE Performed at [facility], [address], [place], [place] [address]    Gram Stain   Final    NO ORGANISMS SEEN SQUAMOUS EPITHELIAL CELLS PRESENT FEW WBC PRESENT, PREDOMINANTLY MONONUCLEAR NO ORGANISMS SEEN BACTERIA    Culture   Final    NO GROWTH < 24 HOURS Performed at [facility], [address] [place], [place] [address]    Report Status PENDING  Incomplete  Aerobic/Anaerobic Culture w Gram Stain (surgical/deep wound)     Status: None (Preliminary result)   Collection Time: 11/01/20  3:12 PM   Specimen: Soft Tissue, Other  Result Value Ref Range Status   Specimen Description   Final    SYNOVIAL KNEE LEFT Performed at [facility], [address], [place], [place] [address]    Special Requests   Final    NONE Performed at [facility], [address] [place], [place] [address]    Gram Stain   Final    NO ORGANISMS SEEN SQUAMOUS EPITHELIAL CELLS PRESENT ABUNDANT WBC PRESENT, PREDOMINANTLY PMN NO ORGANISMS SEEN BACTERIA    Culture   Final    NO GROWTH < 24 HOURS Performed at [facility], [address] [place], [place] [address]    Report Status PENDING  Incomplete  Aerobic/Anaerobic Culture w Gram Stain (surgical/deep wound)     Status: None (Preliminary result)   Collection Time: 11/01/20  3:23 PM   Specimen: Soft Tissue, Other  Result Value Ref Range Status   Specimen Description   Final    KNEE LEFT DEEP Performed at [facility], [address] [place], [place] [address]    Special Requests   Final    NONE Performed at [facility], [address] [place], [place] [address]    Gram Stain   Final    NO ORGANISMS SEEN SQUAMOUS EPITHELIAL CELLS PRESENT RARE WBC PRESENT, PREDOMINANTLY MONONUCLEAR NO ORGANISMS SEEN BACTERIA    Culture   Final    NO GROWTH < 24 HOURS Performed at [facility], [address] [place], [place] [address]    Report Status PENDING  Incomplete  Aerobic/Anaerobic Culture w Gram Stain (surgical/deep wound)     Status: None (Preliminary result)   Collection Time: 11/01/20  3:25 PM   Specimen: Soft Tissue, Other  Result Value Ref Range Status   Specimen Description   Final    TISSUE KNEE LEFT DEEP Performed at [facility], [address] [place], [place] [address]    Special Requests   Final    NONE Performed at [facility], [address] [place], [place] [address]    Gram Stain   Final    NO ORGANISMS SEEN SQUAMOUS EPITHELIAL CELLS PRESENT ABUNDANT WBC PRESENT,BOTH PMN AND MONONUCLEAR RARE GRAM POSITIVE COCCI    Culture   Final    NO GROWTH < 24 HOURS Performed at [facility], [address] [place], [place] [address]    Report Status PENDING  Incomplete  Acey Lav, MD [facility] [phone] pager  11/02/2020, 4:56 PM

## 2020-11-03 DIAGNOSIS — Z96652 Presence of left artificial knee joint: Secondary | ICD-10-CM | POA: Diagnosis not present

## 2020-11-03 DIAGNOSIS — T8459XD Infection and inflammatory reaction due to other internal joint prosthesis, subsequent encounter: Secondary | ICD-10-CM | POA: Diagnosis not present

## 2020-11-03 DIAGNOSIS — B957 Other staphylococcus as the cause of diseases classified elsewhere: Secondary | ICD-10-CM | POA: Diagnosis not present

## 2020-11-03 DIAGNOSIS — Z96659 Presence of unspecified artificial knee joint: Secondary | ICD-10-CM

## 2020-11-03 LAB — CBC
HCT: 32.9 % — ABNORMAL LOW (ref 39.0–52.0)
Hemoglobin: 10.6 g/dL — ABNORMAL LOW (ref 13.0–17.0)
MCH: 27.4 pg (ref 26.0–34.0)
MCHC: 32.2 g/dL (ref 30.0–36.0)
MCV: 85 fL (ref 80.0–100.0)
Platelets: 199 10*3/uL (ref 150–400)
RBC: 3.87 MIL/uL — ABNORMAL LOW (ref 4.22–5.81)
RDW: 14.6 % (ref 11.5–15.5)
WBC: 12.7 10*3/uL — ABNORMAL HIGH (ref 4.0–10.5)
nRBC: 0 % (ref 0.0–0.2)

## 2020-11-03 LAB — CK: Total CK: 99 U/L (ref 49–397)

## 2020-11-03 MED ORDER — ASPIRIN 81 MG PO CHEW: 81.0000 mg | CHEWABLE_TABLET | Freq: Two times a day (BID) | ORAL | 0 refills | Status: AC

## 2020-11-03 MED ORDER — OXYCODONE HCL 5 MG PO TABS: 5.0000 mg | ORAL_TABLET | ORAL | 0 refills | Status: AC | PRN

## 2020-11-03 MED ORDER — DOCUSATE SODIUM 100 MG PO CAPS: 100.0000 mg | ORAL_CAPSULE | Freq: Two times a day (BID) | ORAL | 0 refills | Status: AC

## 2020-11-03 MED ORDER — CELECOXIB 200 MG PO CAPS: 200.0000 mg | ORAL_CAPSULE | Freq: Two times a day (BID) | ORAL | 0 refills | Status: AC

## 2020-11-03 MED ORDER — METHOCARBAMOL 500 MG PO TABS: 500.0000 mg | ORAL_TABLET | Freq: Four times a day (QID) | ORAL | 0 refills | Status: AC | PRN

## 2020-11-03 MED ORDER — DAPTOMYCIN IV (FOR PTA / DISCHARGE USE ONLY): 950.0000 mg | INTRAVENOUS | 0 refills | Status: DC

## 2020-11-03 MED ORDER — POLYETHYLENE GLYCOL 3350 17 G PO PACK: 17.0000 g | PACK | Freq: Every day | ORAL | 0 refills | Status: AC | PRN

## 2020-11-03 NOTE — Progress Notes (Signed)
   Subjective: 2 Days Post-Op Procedure(s) (LRB): IRRIGATION AND DEBRIDEMENT KNEE WITH POLY EXCHANGE (Left) Patient reports pain as mild.   Patient seen in rounds by Dr. Charlann Boxer. Patient is well, and has had no acute complaints or problems. He ambulated 250+ feet with PT yesterday. He is ready to go home today.  Plan is to go Home after hospital stay.  Objective: Vital signs in last 24 hours: Temp:  [97.5 F (36.4 C)-98.1 F (36.7 C)] 97.7 F (36.5 C) (08/31 0519) Pulse Rate:  [72-81] 73 (08/31 0519) Resp:  [14-18] 17 (08/31 0519) BP: (133-155)/(75-98) 133/77 (08/31 0519) SpO2:  [92 %-95 %] 94 % (08/31 0519)  Intake/Output from previous day:  Intake/Output Summary (Last 24 hours) at 11/03/2020 0826 Last data filed at 11/03/2020 0600 Gross per 24 hour  Intake 3127.63 ml  Output 1950 ml  Net 1177.63 ml    Intake/Output this shift: No intake/output data recorded.  Labs: Recent Labs    11/02/20 0420 11/03/20 0342  HGB 11.9* 10.6*   Recent Labs    11/02/20 0420 11/03/20 0342  WBC 9.5 12.7*  RBC 4.45 3.87*  HCT 37.2* 32.9*  PLT 201 199   Recent Labs    11/02/20 0420  NA 138  K 4.3  CL 103  CO2 27  BUN 11  CREATININE 0.77  GLUCOSE 235*  CALCIUM 8.8*   No results for input(s): LABPT, INR in the last 72 hours.  Exam: General - Patient is Alert and Oriented Extremity - Neurologically intact Sensation intact distally Intact pulses distally Dorsiflexion/Plantar flexion intact Dressing/Incision - clean, dry, no drainage Motor Function - intact, moving foot and toes well on exam.   Past Medical History:  Diagnosis Date   Anxiety    occasionally   Arthritis    Chest pain    Dysrhythmia    SVT   GERD (gastroesophageal reflux disease)    HTN (hypertension)    Pre-diabetes    Sleep apnea    cpap    SVT (supraventricular tachycardia) (HCC)     Assessment/Plan: 2 Days Post-Op Procedure(s) (LRB): IRRIGATION AND DEBRIDEMENT KNEE WITH POLY EXCHANGE  (Left) Principal Problem:   S/P revision of total knee, left  Estimated body mass index is 36.88 kg/m as calculated from the following:   Height as of this encounter: 5\' 10"  (1.778 m).   Weight as of this encounter: 116.6 kg. Advance diet Up with therapy D/C IV fluids  DVT Prophylaxis - Aspirin Weight-bearing as tolerated  PICC line placed yesterday. Appreciate the assistance of Dr. . Recommendations for IV daptomycin, will place OPAT request.   Patient is motivated to get home today. He has done well ambulating with PT, and pain has been controlled. Follow up in the office in 2 weeks. All questions welcomed and addressed.       Daiva Eves, PA-C Orthopedic Surgery [phone] 11/03/2020, 8:26 AM

## 2020-11-03 NOTE — TOC Progression Note (Signed)
Transition of Care [facility]) - Progression Note   Patient Details  Name: Thomas [name] MRN: [id number] Date of Birth: 06/29/1970  [facility] CM/SW Contact  Ewing Schlein, LCSW Phone Number: 11/03/2020, 1:21 PM  Clinical Narrative: CSW notified by Elita Quick with [employer] that patient will need to be set up with [facility] prior to the authorization paperwork being submitted to the [place]. CSW made [facility] referral to [facility] as they are in-network with the [place]. CSW requested HHRN orders from ortho PA so that authorization for [facility] services can be started by [facility].  CSW spoke with Brianne at the [facility] and provided her with the agencies providing the services.  CSW updated patient that while the services for IV antibiotics and HHRN have been referred out, discharge is pending VA approval. Patient verbalized understanding. TOC awaiting VA approval.  Expected Discharge Plan: Home w Home Health Services Barriers to Discharge: Continued Medical Work up  Expected Discharge Plan and Services Expected Discharge Plan: Home w Home Health Services In-house Referral: Clinical Social Work Post Acute Care Choice: Home Health Living arrangements for the past 2 months: Single Family Home           DME Arranged: N/A DME Agency: NA HH Arranged: IV Antibiotics HH Agency: [facility] Date HH Agency Contacted: 11/02/20 Representative spoke with at [facility] Agency: Pam  Readmission Risk Interventions No flowsheet data found.

## 2020-11-03 NOTE — Progress Notes (Signed)
PHARMACY CONSULT NOTE FOR:  OUTPATIENT  PARENTERAL ANTIBIOTIC THERAPY (OPAT)  Indication: PJI Regimen: Daptomycin 950mg  IV q24 End date: 12/10/20  IV antibiotic discharge orders are pended. To discharging provider:  please sign these orders via discharge navigator,  Select New Orders & click on the button choice - Manage This Unsigned Work.     Thank you for allowing pharmacy to be a part of this patient's care.  02/09/21 11/03/2020, 12:19 PM

## 2020-11-03 NOTE — Progress Notes (Signed)
PT Cancellation Note  Patient Details Name: Thomas [name] MRN: [id number] DOB: Feb 26, 1971   Cancelled Treatment:    Reason Eval/Treat Not Completed: Other (comment) (Pt is ambulating independently on the halls. No further PT indicated, will sign off.)  Tamala Ser PT 11/03/2020  Acute Rehabilitation Services Pager [phone] Office [phone]

## 2020-11-03 NOTE — Progress Notes (Addendum)
Subjective: No new complaints patient is eager to go home.   Antibiotics:  Anti-infectives (From admission, onward)    Start     Dose/Rate Route Frequency Ordered Stop   11/02/20 2000  DAPTOmycin (CUBICIN) 950 mg in sodium chloride 0.9 % IVPB  Status:  Discontinued        8 mg/kg  116.6 kg Intravenous Daily 11/02/20 1332 11/02/20 1338   11/02/20 1430  DAPTOmycin (CUBICIN) 950 mg in sodium chloride 0.9 % IVPB        8 mg/kg  116.6 kg 138 mL/hr over 30 Minutes Intravenous Daily 11/02/20 1338     11/01/20 2130  ceFAZolin (ANCEF) IVPB 2g/100 mL premix        2 g 200 mL/hr over 30 Minutes Intravenous Every 6 hours 11/01/20 1902 11/02/20 0402   11/01/20 1533  vancomycin (VANCOCIN) powder  Status:  Discontinued          As needed 11/01/20 1534 11/01/20 1844   11/01/20 1145  ceFAZolin (ANCEF) IVPB 2g/100 mL premix        2 g 200 mL/hr over 30 Minutes Intravenous On call to O.R. 11/01/20 1139 11/01/20 1522       Medications: Scheduled Meds:  aspirin  81 mg Oral BID   buPROPion  300 mg Oral q AM   celecoxib  200 mg Oral BID   Chlorhexidine Gluconate Cloth  6 each Topical Daily   docusate sodium  100 mg Oral BID   DULoxetine  60 mg Oral q AM   enalapril  10 mg Oral q AM   ferrous sulfate  325 mg Oral TID PC   hydrOXYzine  25 mg Oral Q supper   hydrOXYzine  50 mg Oral QHS   melatonin  3 mg Oral QHS   metoprolol tartrate  100 mg Oral BID   pantoprazole  40 mg Oral Daily   sodium chloride flush  10-40 mL Intracatheter Q12H   traZODone  200 mg Oral QHS   verapamil  120 mg Oral q AM   Continuous Infusions:  sodium chloride 75 mL/hr at 11/02/20 1203   DAPTOmycin (CUBICIN)  IV 950 mg (11/02/20 1537)   methocarbamol (ROBAXIN) IV     PRN Meds:.bisacodyl, diphenhydrAMINE, HYDROmorphone (DILAUDID) injection, menthol-cetylpyridinium **OR** phenol, methocarbamol **OR** methocarbamol (ROBAXIN) IV, metoCLOPramide **OR** metoCLOPramide (REGLAN) injection, ondansetron **OR**  ondansetron (ZOFRAN) IV, oxyCODONE, oxyCODONE, polyethylene glycol, sodium chloride flush    Objective: Weight change:   Intake/Output Summary (Last 24 hours) at 11/03/2020 1425 Last data filed at 11/03/2020 1300 Gross per 24 hour  Intake 2923.75 ml  Output 1950 ml  Net 973.75 ml   Blood pressure 133/77, pulse 73, temperature 97.7 F (36.5 C), temperature source Axillary, resp. rate 17, height 5' 10" (1.778 m), weight 116.6 kg, SpO2 94 %. Temp:  [97.6 F (36.4 C)-98.1 F (36.7 C)] 97.7 F (36.5 C) (08/31 0519) Pulse Rate:  [72-81] 73 (08/31 0519) Resp:  [14-18] 17 (08/31 0519) BP: (133-155)/(77-98) 133/77 (08/31 0519) SpO2:  [92 %-95 %] 94 % (08/31 0519)  Physical Exam: Physical Exam Constitutional:      Appearance: He is well-developed.  HENT:     Head: Normocephalic and atraumatic.  Eyes:     Conjunctiva/sclera: Conjunctivae normal.  Cardiovascular:     Rate and Rhythm: Normal rate and regular rhythm.  Pulmonary:     Effort: Pulmonary effort is normal. No respiratory distress.     Breath sounds: Normal breath sounds. No stridor.  No wheezing.  Abdominal:     General: There is no distension.     Palpations: Abdomen is soft.  Musculoskeletal:     Cervical back: Normal range of motion and neck supple.  Skin:    General: Skin is warm and dry.     Findings: No erythema or rash.  Neurological:     General: No focal deficit present.     Mental Status: He is alert and oriented to person, place, and time.  Psychiatric:        Mood and Affect: Mood normal.        Behavior: Behavior normal.        Thought Content: Thought content normal.        Judgment: Judgment normal.    Left knee is bandaged CBC:    BMET Recent Labs    11/02/20 0420  NA 138  K 4.3  CL 103  CO2 27  GLUCOSE 235*  BUN 11  CREATININE 0.77  CALCIUM 8.8*     Liver Panel  No results for input(s): PROT, ALBUMIN, AST, ALT, ALKPHOS, BILITOT, BILIDIR, IBILI in the last 72  hours.     Sedimentation Rate No results for input(s): ESRSEDRATE in the last 72 hours. C-Reactive Protein No results for input(s): CRP in the last 72 hours.  Micro Results: Recent Results (from the past 720 hour(s))  Surgical pcr screen     Status: None   Collection Time: 10/22/20  8:29 AM   Specimen: Nasal Mucosa; Nasal Swab  Result Value Ref Range Status   MRSA, PCR NEGATIVE NEGATIVE Final   Staphylococcus aureus NEGATIVE NEGATIVE Final    Comment: (NOTE) The Xpert SA Assay (FDA approved for NASAL specimens in patients 25 years of age and older), is one component of a comprehensive surveillance program. It is not intended to diagnose infection nor to guide or monitor treatment. Performed at [facility], [place] [address], [place], [place] [address]   SARS Coronavirus 2 (TAT 6-24 hrs)     Status: None   Collection Time: 10/28/20 12:00 AM  Result Value Ref Range Status   SARS Coronavirus 2 RESULT: NEGATIVE  Final    Comment: RESULT: NEGATIVESARS-CoV-2 INTERPRETATION:A NEGATIVE  test result means that SARS-CoV-2 RNA was not present in the specimen above the limit of detection of this test. This does not preclude a possible SARS-CoV-2 infection and should not be used as the  sole basis for patient management decisions. Negative results must be combined with clinical observations, patient history, and epidemiological information. Optimum specimen types and timing for peak viral levels during infections caused by SARS-CoV-2  have not been determined. Collection of multiple specimens or types of specimens may be necessary to detect virus. Improper specimen collection and handling, sequence variability under primers/probes, or organism present below the limit of detection may  lead to false negative results. Positive and negative predictive values of testing are highly dependent on prevalence. False negative test results are more likely when prevalence of disease is  high.The expected result is NEGATIVE.Fact S heet for  Healthcare Providers: LocalChronicle.no Sheet for Patients: SalonLookup.es Reference Range - Negative   Aerobic/Anaerobic Culture w Gram Stain (surgical/deep wound)     Status: None (Preliminary result)   Collection Time: 11/01/20  3:10 PM   Specimen: Soft Tissue, Other  Result Value Ref Range Status   Specimen Description   Final    KNEE LEFT SUPERFICIAL Performed at [facility], [place] [address] [place], [place]  [address]    Special Requests   Final    NONE Performed at [facility], [place] [address], [place], [place] [address]    Gram Stain   Final    NO ORGANISMS SEEN SQUAMOUS EPITHELIAL CELLS PRESENT FEW WBC PRESENT, PREDOMINANTLY MONONUCLEAR NO ORGANISMS SEEN BACTERIA    Culture   Final    NO GROWTH 2 DAYS Performed at [facility], [place] [address], [place], [place] [address]    Report Status PENDING  Incomplete  Aerobic/Anaerobic Culture w Gram Stain (surgical/deep wound)     Status: None (Preliminary result)   Collection Time: 11/01/20  3:12 PM   Specimen: Soft Tissue, Other  Result Value Ref Range Status   Specimen Description   Final    SYNOVIAL KNEE LEFT Performed at [place] [address], [place], [place] [address]    Special Requests   Final    NONE Performed at [facility], [place] [address] [place], [place] [address]    Gram Stain   Final    NO ORGANISMS SEEN SQUAMOUS EPITHELIAL CELLS PRESENT ABUNDANT WBC PRESENT, PREDOMINANTLY PMN NO ORGANISMS SEEN BACTERIA    Culture   Final    NO GROWTH 2 DAYS Performed at [facility], [place] [address], [place], [place] [address]    Report Status PENDING  Incomplete  Aerobic/Anaerobic Culture w Gram Stain (surgical/deep wound)     Status: None (Preliminary result)   Collection Time: 11/01/20  3:23 PM   Specimen:  Soft Tissue, Other  Result Value Ref Range Status   Specimen Description   Final    KNEE LEFT DEEP Performed at [place] [address], [place], [place] [address]    Special Requests   Final    NONE Performed at [facility], [place] [address], [place], [place] [address]    Gram Stain   Final    NO ORGANISMS SEEN SQUAMOUS EPITHELIAL CELLS PRESENT RARE WBC PRESENT, PREDOMINANTLY MONONUCLEAR NO ORGANISMS SEEN BACTERIA    Culture   Final    NO GROWTH 2 DAYS Performed at [facility], [place] [address] [place], [place] [address]    Report Status PENDING  Incomplete  Aerobic/Anaerobic Culture w Gram Stain (surgical/deep wound)     Status: None (Preliminary result)   Collection Time: 11/01/20  3:25 PM   Specimen: Soft Tissue, Other  Result Value Ref Range Status   Specimen Description   Final    TISSUE KNEE LEFT DEEP Performed at [place] [address] [place], [place] [address]    Special Requests   Final    NONE Performed at [facility], [place] [address], [place], [place] [address]    Gram Stain   Final    NO ORGANISMS SEEN SQUAMOUS EPITHELIAL CELLS PRESENT ABUNDANT WBC PRESENT,BOTH PMN AND MONONUCLEAR RARE GRAM POSITIVE COCCI    Culture   Final    FEW STAPHYLOCOCCUS EPIDERMIDIS SUSCEPTIBILITIES TO FOLLOW Performed at [facility], [place] [address] [place], [place] [address]    Report Status PENDING  Incomplete    Studies/Results: [place] EKG SITE RITE  Result Date: 11/01/2020 If Site Rite image not attached, placement could not be confirmed due to current cardiac rhythm.     Assessment/Plan:  INTERVAL HISTORY: Her operative cultures is growing a Staph epidermidis   Principal Problem:   S/P revision of total knee, left    Randall Hiss  Woodford is a 50 y.o. 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  1.  Recurrent prosthetic joint infection: -- Continue daptomycin and aim for nearly 6 weeks of therapy but time the end of his therapy to accommodate his trip to [facility]  After finishing IV daptomycin we will likely have to use clindamycin and/or try to get a different antibiotic such as tedizolid or omadcycline approved to cover this organism  Need to treat for 6 months to more likely a year.  Case discussed with Dr. Alvan Dame who called me today. If this approach fails pt will need another 2 staged procedure  I spent  38 minutes with the patient including greater than 50% of time in face to face counseling of the patient the nature of his prosthetic joint infection personally reviewing the [facility] differential CMP, as well as his intraoperative culture data from this admission and from July 2020, along with review of medical records before and during the visit and in coordination of his care.    Allergies  Allergen Reactions   Tylenol [Acetaminophen] Other (See Comments)    Can't have because of liver issues    OPAT Orders Discharge antibiotics:    Indication: PJI Regimen: Daptomycin 923m IV q24 End date: 12/10/20    [facility]:   Labs  weekly while on IV antibiotics: _x_ CBC with differential _x_ BMP w GFR/CMP __xCRP _x_ ESR   _x_ CK  _x_ Please pull PIC at completion of IV antibiotics __ Please leave PIC in place until doctor has seen patient or been notified  Fax weekly labs to [phone] Clinic Follow Up Appt:    ERose Hipplerhas an appointment on 12/02/2020 at 149AM with Dr. VTommy Medal [facility]  [address], which is located to the left of the elevators.  Phone: [phone] Fax: [phone] [email or web address]  He should arrive 15-30 minutes  prior to his appointment.   LOS: 2 days   CAlcide Evener8/31/2022, 2:25 PM

## 2020-11-04 ENCOUNTER — Encounter (HOSPITAL_COMMUNITY): Payer: Self-pay | Admitting: Orthopedic Surgery

## 2020-11-04 DIAGNOSIS — Z96652 Presence of left artificial knee joint: Secondary | ICD-10-CM | POA: Diagnosis not present

## 2020-11-04 DIAGNOSIS — T8450XD Infection and inflammatory reaction due to unspecified internal joint prosthesis, subsequent encounter: Secondary | ICD-10-CM

## 2020-11-04 DIAGNOSIS — B957 Other staphylococcus as the cause of diseases classified elsewhere: Secondary | ICD-10-CM | POA: Diagnosis not present

## 2020-11-04 MED ORDER — DAPTOMYCIN IV (FOR PTA / DISCHARGE USE ONLY): 750.0000 mg | INTRAVENOUS | 0 refills | Status: AC

## 2020-11-04 MED ORDER — DAPTOMYCIN IV (FOR PTA / DISCHARGE USE ONLY): 950.0000 mg | INTRAVENOUS | 0 refills | Status: DC

## 2020-11-04 MED ORDER — SODIUM CHLORIDE 0.9 % IV SOLN
8.0000 mg/kg | Freq: Once | INTRAVENOUS | Status: AC
  Administered 2020-11-04: 950 mg via INTRAVENOUS
  Filled 2020-11-04: qty 19

## 2020-11-04 MED ORDER — HEPARIN SOD (PORK) LOCK FLUSH 100 UNIT/ML IV SOLN
250.0000 [IU] | INTRAVENOUS | Status: AC | PRN
Start: 2020-11-04 — End: 2020-11-04
  Administered 2020-11-04: 250 [IU]
  Filled 2020-11-04: qty 2.5

## 2020-11-04 NOTE — Progress Notes (Signed)
PHARMACY CONSULT NOTE FOR:  OUTPATIENT  PARENTERAL ANTIBIOTIC THERAPY (OPAT)  Dose adjustment made per request of [facility] to use adjusted body weight  Indication: PJI Regimen: Daptomycin 750mg  IV q24 End date: 12/10/20  IV antibiotic discharge orders are pended. To discharging provider:  please sign these orders via discharge navigator,  Select New Orders & click on the button choice - Manage This Unsigned Work.     Thank you for allowing pharmacy to be a part of this patient's care.  02/09/21 11/04/2020, 7:46 AM

## 2020-11-04 NOTE — Progress Notes (Signed)
Reviewed written d/c instructions w pt and all questions answered. Pt verbalized understanding. D/C ambulatory w all belongings in stable condition. 

## 2020-11-04 NOTE — TOC Transition Note (Signed)
Transition of Care [facility]) - CM/SW Discharge Note  Patient Details  Name: Elray Dains MRN: [id number] Date of Birth: 10-22-1970  Transition of Care [facility]) CM/SW Contact:  Ewing Schlein, LCSW Phone Number: 11/04/2020, 2:00 PM  Clinical Narrative: [facility] has received insurance authorization for [facility]. CSW updated by Elita Quick with [employer] that insurance authorization is still pending for IV antibiotics, but the patient can discharge today and will not need to wait in the hospital. CSW updated patient. Patient to call family to pick him up from the hospital. TOC signing off.  Final next level of care: Home w Home Health Services Barriers to Discharge: Barriers Resolved  Patient Goals and CMS Choice Patient states their goals for this hospitalization and ongoing recovery are:: Return home CMS Medicare.gov Compare Post Acute Care list provided to:: Patient Choice offered to / list presented to : Patient  Discharge Plan and Services In-house Referral: Clinical Social Work Post Acute Care Choice: Home Health          DME Arranged: N/A DME Agency: NA HH Arranged: RN, IV Antibiotics HH Agency: [facility] Date HH Agency Contacted: 11/02/20 Representative spoke with at [facility] Agency: Elita Quick Julianne Rice), [facility] [place] ([facility])  Readmission Risk Interventions No flowsheet data found.

## 2020-11-04 NOTE — Progress Notes (Signed)
Subjective: No new complaints patient is eager to go home.   Antibiotics:  Anti-infectives (From admission, onward)    Start     Dose/Rate Route Frequency Ordered Stop   11/04/20 1000  DAPTOmycin (CUBICIN) 950 mg in sodium chloride 0.9 % IVPB        8 mg/kg  116.6 kg 138 mL/hr over 30 Minutes Intravenous  Once 11/04/20 0950 11/04/20 1150   11/04/20 0000  daptomycin (CUBICIN) IVPB  Status:  Discontinued        950 mg Intravenous Every 24 hours 11/04/20 0650 11/04/20    11/04/20 0000  daptomycin (CUBICIN) IVPB        750 mg Intravenous Every 24 hours 11/04/20 0745 12/11/20 2359   11/03/20 0000  daptomycin (CUBICIN) IVPB  Status:  Discontinued        950 mg Intravenous Every 24 hours 11/03/20 1427 11/03/20    11/03/20 0000  daptomycin (CUBICIN) IVPB  Status:  Discontinued        950 mg Intravenous Every 24 hours 11/03/20 1437 11/04/20    11/02/20 2000  DAPTOmycin (CUBICIN) 950 mg in sodium chloride 0.9 % IVPB  Status:  Discontinued        8 mg/kg  116.6 kg Intravenous Daily 11/02/20 1332 11/02/20 1338   11/02/20 1430  DAPTOmycin (CUBICIN) 950 mg in sodium chloride 0.9 % IVPB  Status:  Discontinued        8 mg/kg  116.6 kg 138 mL/hr over 30 Minutes Intravenous Daily 11/02/20 1338 11/04/20 0950   11/01/20 2130  ceFAZolin (ANCEF) IVPB 2g/100 mL premix        2 g 200 mL/hr over 30 Minutes Intravenous Every 6 hours 11/01/20 1902 11/02/20 0402   11/01/20 1533  vancomycin (VANCOCIN) powder  Status:  Discontinued          As needed 11/01/20 1534 11/01/20 1844   11/01/20 1145  ceFAZolin (ANCEF) IVPB 2g/100 mL premix        2 g 200 mL/hr over 30 Minutes Intravenous On call to O.R. 11/01/20 1139 11/01/20 1522       Medications: Scheduled Meds:  aspirin  81 mg Oral BID   buPROPion  300 mg Oral q AM   celecoxib  200 mg Oral BID   Chlorhexidine Gluconate Cloth  6 each Topical Daily   docusate sodium  100 mg Oral BID   DULoxetine  60 mg Oral q AM   enalapril  10 mg Oral q  AM   ferrous sulfate  325 mg Oral TID PC   hydrOXYzine  25 mg Oral Q supper   hydrOXYzine  50 mg Oral QHS   melatonin  3 mg Oral QHS   metoprolol tartrate  100 mg Oral BID   pantoprazole  40 mg Oral Daily   sodium chloride flush  10-40 mL Intracatheter Q12H   traZODone  200 mg Oral QHS   verapamil  120 mg Oral q AM   Continuous Infusions:  sodium chloride 75 mL/hr at 11/02/20 1203   methocarbamol (ROBAXIN) IV     PRN Meds:.bisacodyl, diphenhydrAMINE, HYDROmorphone (DILAUDID) injection, menthol-cetylpyridinium **OR** phenol, methocarbamol **OR** methocarbamol (ROBAXIN) IV, metoCLOPramide **OR** metoCLOPramide (REGLAN) injection, ondansetron **OR** ondansetron (ZOFRAN) IV, oxyCODONE, oxyCODONE, polyethylene glycol, sodium chloride flush    Objective: Weight change:   Intake/Output Summary (Last 24 hours) at 11/04/2020 1334 Last data filed at 11/04/2020 1000 Gross per 24 hour  Intake 1997.73 ml  Output --  Net 1997.73 ml  Blood pressure 128/83, pulse 68, temperature (!) 97.5 F (36.4 C), temperature source Oral, resp. rate 18, height '5\' 10"'  (1.778 m), weight 116.6 kg, SpO2 99 %. Temp:  [97.4 F (36.3 C)-97.5 F (36.4 C)] 97.5 F (36.4 C) (09/01 0557) Pulse Rate:  [68-71] 68 (09/01 0557) Resp:  [18] 18 (09/01 0557) BP: (128-140)/(80-86) 128/83 (09/01 0557) SpO2:  [98 %-100 %] 99 % (09/01 0557)  Physical Exam: Physical Exam Constitutional:      Appearance: He is well-developed.  HENT:     Head: Normocephalic and atraumatic.  Eyes:     Conjunctiva/sclera: Conjunctivae normal.  Cardiovascular:     Rate and Rhythm: Normal rate and regular rhythm.  Pulmonary:     Effort: Pulmonary effort is normal. No respiratory distress.     Breath sounds: Normal breath sounds. No stridor. No wheezing.  Abdominal:     General: There is no distension.     Palpations: Abdomen is soft.  Musculoskeletal:        General: Normal range of motion.     Cervical back: Normal range of motion  and neck supple.  Skin:    General: Skin is warm and dry.     Findings: No erythema or rash.  Neurological:     General: No focal deficit present.     Mental Status: He is alert and oriented to person, place, and time.  Psychiatric:        Mood and Affect: Mood normal.        Behavior: Behavior normal.        Thought Content: Thought content normal.        Judgment: Judgment normal.    Left knee is bandaged CBC:    BMET Recent Labs    11/02/20 0420  NA 138  K 4.3  CL 103  CO2 27  GLUCOSE 235*  BUN 11  CREATININE 0.77  CALCIUM 8.8*      Liver Panel  No results for input(s): PROT, ALBUMIN, AST, ALT, ALKPHOS, BILITOT, BILIDIR, IBILI in the last 72 hours.     Sedimentation Rate No results for input(s): ESRSEDRATE in the last 72 hours. C-Reactive Protein No results for input(s): CRP in the last 72 hours.  Micro Results: Recent Results (from the past 720 hour(s))  Surgical pcr screen     Status: None   Collection Time: 10/22/20  8:29 AM   Specimen: Nasal Mucosa; Nasal Swab  Result Value Ref Range Status   MRSA, PCR NEGATIVE NEGATIVE Final   Staphylococcus aureus NEGATIVE NEGATIVE Final    Comment: (NOTE) The Xpert SA Assay (FDA approved for NASAL specimens in patients 70 years of age and older), is one component of a comprehensive surveillance program. It is not intended to diagnose infection nor to guide or monitor treatment. Performed at [facility], [place] [address], [place], [place] [address]   SARS Coronavirus 2 (TAT 6-24 hrs)     Status: None   Collection Time: 10/28/20 12:00 AM  Result Value Ref Range Status   SARS Coronavirus 2 RESULT: NEGATIVE  Final    Comment: RESULT: NEGATIVESARS-CoV-2 INTERPRETATION:A NEGATIVE  test result means that SARS-CoV-2 RNA was not present in the specimen above the limit of detection of this test. This does not preclude a possible SARS-CoV-2 infection and should not be used as the  sole basis for  patient management decisions. Negative results must be combined with clinical observations, patient history, and epidemiological information. Optimum specimen types and timing for peak  viral levels during infections caused by SARS-CoV-2  have not been determined. Collection of multiple specimens or types of specimens may be necessary to detect virus. Improper specimen collection and handling, sequence variability under primers/probes, or organism present below the limit of detection may  lead to false negative results. Positive and negative predictive values of testing are highly dependent on prevalence. False negative test results are more likely when prevalence of disease is high.The expected result is NEGATIVE.Fact S heet for  Healthcare Providers: LocalChronicle.no Sheet for Patients: SalonLookup.es Reference Range - Negative   Aerobic/Anaerobic Culture w Gram Stain (surgical/deep wound)     Status: None (Preliminary result)   Collection Time: 11/01/20  3:10 PM   Specimen: Soft Tissue, Other  Result Value Ref Range Status   Specimen Description   Final    KNEE LEFT SUPERFICIAL Performed at [facility], [place] [address], [place], [place] [address]    Special Requests   Final    NONE Performed at [facility], [place] [address], [place], [place] [address]    Gram Stain   Final    NO ORGANISMS SEEN SQUAMOUS EPITHELIAL CELLS PRESENT FEW WBC PRESENT, PREDOMINANTLY MONONUCLEAR NO ORGANISMS SEEN BACTERIA    Culture   Final    NO GROWTH 3 DAYS Performed at [facility], [place] [address], [place], [place] [address]    Report Status PENDING  Incomplete  Aerobic/Anaerobic Culture w Gram Stain (surgical/deep wound)     Status: None (Preliminary result)   Collection Time: 11/01/20  3:12 PM   Specimen: Soft Tissue, Other  Result Value Ref Range Status   Specimen Description   Final    SYNOVIAL  KNEE LEFT Performed at [place] [address] [place], [place] [address]    Special Requests   Final    NONE Performed at [facility], [place] [address] [place], [place] [address]    Gram Stain   Final    NO ORGANISMS SEEN SQUAMOUS EPITHELIAL CELLS PRESENT ABUNDANT WBC PRESENT, PREDOMINANTLY PMN NO ORGANISMS SEEN BACTERIA    Culture   Final    NO GROWTH 3 DAYS Performed at [facility], [place] [address], [place], [place] [address]    Report Status PENDING  Incomplete  Aerobic/Anaerobic Culture w Gram Stain (surgical/deep wound)     Status: None (Preliminary result)   Collection Time: 11/01/20  3:23 PM   Specimen: Soft Tissue, Other  Result Value Ref Range Status   Specimen Description   Final    KNEE LEFT DEEP Performed at [place] [address], [place], [place] [address]    Special Requests   Final    NONE Performed at [facility], [place] [address], [place], [place] [address]    Gram Stain   Final    NO ORGANISMS SEEN SQUAMOUS EPITHELIAL CELLS PRESENT RARE WBC PRESENT, PREDOMINANTLY MONONUCLEAR NO ORGANISMS SEEN BACTERIA    Culture   Final    NO GROWTH 3 DAYS Performed at [facility], [place] [address], [place],  [address]    Report Status PENDING  Incomplete  Aerobic/Anaerobic Culture w Gram Stain (surgical/deep wound)     Status: None (Preliminary result)   Collection Time: 11/01/20  3:25 PM   Specimen: Soft Tissue, Other  Result Value Ref Range Status   Specimen Description   Final    TISSUE KNEE LEFT DEEP Performed at [facility], [address] [place], [place] [address]    Special Requests   Final    NONE Performed at [facility], [place] [address] [place], [place] [address]    Gram Stain   Final    NO ORGANISMS SEEN SQUAMOUS EPITHELIAL CELLS PRESENT ABUNDANT WBC PRESENT,BOTH PMN AND MONONUCLEAR RARE GRAM POSITIVE  COCCI Performed at [facility], [place] [address], [place], [place] [address]    Culture   Final    FEW STAPHYLOCOCCUS EPIDERMIDIS NO ANAEROBES ISOLATED; CULTURE IN PROGRESS FOR 5 DAYS    Report Status PENDING  Incomplete    Studies/Results: No results found.    Assessment/Plan:  INTERVAL HISTORY:  sensitivities still pending from MRSE   Principal Problem:   S/P revision of total knee, left    Thomas [name] is a 50 y.o. 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  1.  Current prosthetic joint infection with MRSE -- Continue daptomycin and aim for nearly 6 weeks of therapy but time the end of his therapy to accommodate his trip to [facility]  After completion of IV antibiotics we will have to undergo trial of clindamycin with hopes that he does not develop C. difficile colitis or try to get approval for different more expensive alternative antistaphylococcal agent.     Allergies  Allergen Reactions   Tylenol [Acetaminophen] Other (See Comments)    Can't have because of liver issues    OPAT Orders Discharge antibiotics:    Indication: PJI Regimen: Daptomycin 925m IV q24 End date: 12/10/20    [facility]   Labs  weekly while on IV antibiotics: _x_ CBC with differential _x_ BMP w GFR/CMP __xCRP _x_ ESR   _x_ CK  _x_ Please pull PIC at completion of IV antibiotics __ Please leave PIC in place until doctor has seen patient or been notified  Fax weekly labs to [phone] Clinic Follow Up Appt:    ELathaniel Legatehas an appointment on 12/02/2020 at 138AM with Dr. VTommy Medal [facility]at  [address], which is located to the left of the elevators.  Phone: [phone] Fax: [phone] [email or web address]  He should arrive 15-30 minutes prior to his appointment.  I will sign off for now please call with further questions.    LOS: 3 days   Thomas Evener9/03/2020, 1:34 PM

## 2020-11-06 LAB — AEROBIC/ANAEROBIC CULTURE W GRAM STAIN (SURGICAL/DEEP WOUND)
Culture: NO GROWTH
Culture: NORMAL
Culture: NORMAL

## 2020-11-15 NOTE — Discharge Summary (Signed)
Physician Discharge Summary   Patient ID: Thomas [name] MRN: [id number] DOB/AGE: [birth year] 50 y.o.  Admit date: 11/01/2020 Discharge date: 11/04/2020  Primary Diagnosis: Recurrent infection, left knee following two-stage treatment and placement of revised femoral and tibial components.  Admission Diagnoses:  Past Medical History:  Diagnosis Date   Anxiety    occasionally   Arthritis    Chest pain    Dysrhythmia    SVT   GERD (gastroesophageal reflux disease)    HTN (hypertension)    Pre-diabetes    Sleep apnea    cpap    SVT (supraventricular tachycardia) ([place])    Discharge Diagnoses:   Principal Problem:   S/P revision of total knee, left  Estimated body mass index is 36.88 kg/m as calculated from the following:   Height as of this encounter: _0  (1.778 m).   Weight as of this encounter: 116.6 kg.  Procedure:  Procedure(s) (LRB): IRRIGATION AND DEBRIDEMENT KNEE WITH POLY EXCHANGE (Left)   Consults: None  HPI: The patient is a pleasant 50 year old male with an unfortunate history involving his left knee with multiple surgical procedures.  He most recently had issues complicated by infection requiring resection of a total knee replacement followed  by treatment with IV antibiotics.  He had his knee reimplanted and was on suppressive doxycycline.  He presented to the office 2 weeks ago with increasing concerns for pain and erythema to his left knee with swelling.  Radiographs revealed stable  components.  I had a lengthy discussion with the patient regarding the condition of his knee.  Unfortunately, the gold standard treatment for his knee would be to resect his implants.  However, I have significant concerns regarding the morbidity  associated with removing particularly the press-fit tibial component.  Given these concerns, I recommended that we try an excisional and non-excisional debridement of the left knee with IV antibiotics for 5 to 6 weeks followed by suppressive  antibiotic  course.  We reviewed the risk of recurrent infection, DVT, component failure, need for future surgeries.  This does represent an extremely complex situation as has been outlined with the patient.  Surgery was scheduled and consent obtained for benefit of  management of his infection.  Laboratory Data: Admission on 11/01/2020, Discharged on 11/04/2020  Component Date Value Ref Range Status   ABO/RH(D) 11/01/2020 A POS   Final   Antibody Screen 11/01/2020 NEG   Final   Sample Expiration 11/01/2020    Final                   Value:11/04/2020,2359 Performed at [facility], [place] [address], [place], [place] [address]    Specimen Description 11/01/2020    Final                   Value:KNEE LEFT SUPERFICIAL Performed at [facility], [place] [address], [place], [place] [address]    Special Requests 11/01/2020    Final                   Value:NONE Performed at [facility], [place] [address], [place], [place] [address]    Gram Stain 11/01/2020    Final                   Value:NO SQUAMOUS EPITHELIAL CELLS PRESENT FEW WBC PRESENT, PREDOMINANTLY MONONUCLEAR NO ORGANISMS SEEN    Culture 11/01/2020    Final  Value:No growth aerobically or anaerobically. Performed at [facility], [place] [address] [place], [place] [address]    Report Status 11/01/2020 11/06/2020 FINAL   Final   Specimen Description 11/01/2020    Final                   Value:SYNOVIAL KNEE LEFT Performed at [facility], [place] [address] [place], [place] [address]    Special Requests 11/01/2020    Final                   Value:NONE Performed at [facility], [place] [address], [place], [place] [address]    Gram Stain 11/01/2020    Final                   Value:NO SQUAMOUS EPITHELIAL CELLS PRESENT ABUNDANT WBC PRESENT, PREDOMINANTLY PMN NO ORGANISMS SEEN    Culture 11/01/2020    Final                    Value:RARE NORMAL SKIN FLORA NO GROUP A STREP (S.PYOGENES) ISOLATED NO STAPHYLOCOCCUS AUREUS ISOLATED NO ANAEROBES ISOLATED Performed at [facility], [place] [address], [place], [place] [address]    Report Status 11/01/2020 11/06/2020 FINAL   Final   Specimen Description 11/01/2020    Final                   Value:KNEE LEFT DEEP Performed at [facility], [place] [address] [place], [place] [address]    Special Requests 11/01/2020    Final                   Value:NONE Performed at [facility], [place] [address], [place], [place] [address]    Gram Stain 11/01/2020    Final                   Value:NO SQUAMOUS EPITHELIAL CELLS PRESENT RARE WBC PRESENT, PREDOMINANTLY MONONUCLEAR NO ORGANISMS SEEN    Culture 11/01/2020    Final                   Value:RARE NORMAL SKIN FLORA NO GROUP A STREP (S.PYOGENES) ISOLATED NO STAPHYLOCOCCUS AUREUS ISOLATED NO ANAEROBES ISOLATED Performed at [facility], [place] [address], [place], [place] [address]    Report Status 11/01/2020 11/06/2020 FINAL   Final   Specimen Description 11/01/2020    Final                   Value:TISSUE KNEE LEFT DEEP Performed at [facility], [place] [address] [place], [place] [address]    Special Requests 11/01/2020    Final                   Value:NONE Performed at [facility], [place] [address] [place], [place] [address]    Gram Stain 11/01/2020    Final                   Value:NO SQUAMOUS EPITHELIAL CELLS PRESENT ABUNDANT WBC PRESENT,BOTH PMN AND MONONUCLEAR RARE GRAM POSITIVE COCCI    Culture 11/01/2020    Final                   Value:FEW STAPHYLOCOCCUS EPIDERMIDIS NO ANAEROBES ISOLATED Performed at [facility], [place] [address], [place],  [address]    Report Status 11/01/2020 11/06/2020  FINAL   Final   Organism ID, Bacteria 11/01/2020 STAPHYLOCOCCUS EPIDERMIDIS   Final   WBC 11/02/2020 9.5  4.0 - 10.5 K/uL Final   RBC  11/02/2020 4.45  4.22 - 5.81 MIL/uL Final   Hemoglobin 11/02/2020 11.9 (A) 13.0 - 17.0 g/dL Final   HCT 11/02/2020 37.2 (A) 39.0 - 52.0 % Final   MCV 11/02/2020 83.6  80.0 - 100.0 fL Final   MCH 11/02/2020 26.7  26.0 - 34.0 pg Final   MCHC 11/02/2020 32.0  30.0 - 36.0 g/dL Final   RDW 11/02/2020 14.4  11.5 - 15.5 % Final   Platelets 11/02/2020 201  150 - 400 K/uL Final   nRBC 11/02/2020 0.0  0.0 - 0.2 % Final   Performed at [facility], [place] [address] [place], [place] [address]   Sodium 11/02/2020 138  135 - 145 mmol/L Final   Potassium 11/02/2020 4.3  3.5 - 5.1 mmol/L Final   Chloride 11/02/2020 103  98 - 111 mmol/L Final   CO2 11/02/2020 27  22 - 32 mmol/L Final   Glucose, Bld 11/02/2020 235 (A) 70 - 99 mg/dL Final   Glucose reference range applies only to samples taken after fasting for at least 8 hours.   BUN 11/02/2020 11  6 - 20 mg/dL Final   Creatinine, Ser 11/02/2020 0.77  0.61 - 1.24 mg/dL Final   Calcium 11/02/2020 8.8 (A) 8.9 - 10.3 mg/dL Final   GFR, Estimated 11/02/2020 >60  >60 mL/min Final   Comment: (NOTE) Calculated using the CKD-EPI Creatinine Equation (2021)    Anion gap 11/02/2020 8  5 - 15 Final   Performed at [facility], [place] [address], [place], [place] [address]   WBC 11/03/2020 12.7 (A) 4.0 - 10.5 K/uL Final   RBC 11/03/2020 3.87 (A) 4.22 - 5.81 MIL/uL Final   Hemoglobin 11/03/2020 10.6 (A) 13.0 - 17.0 g/dL Final   HCT 11/03/2020 32.9 (A) 39.0 - 52.0 % Final   MCV 11/03/2020 85.0  80.0 - 100.0 fL Final   MCH 11/03/2020 27.4  26.0 - 34.0 pg Final   MCHC 11/03/2020 32.2  30.0 - 36.0 g/dL Final   RDW 11/03/2020 14.6  11.5 - 15.5 % Final   Platelets 11/03/2020 199  150 - 400 K/uL Final   nRBC 11/03/2020 0.0  0.0 - 0.2 % Final   Performed at [facility], [place] [address], [place], [place] [address]   Total CK 11/03/2020 99  49 - 397 U/L Final   Performed at [facility], [place]  [address], [place], [place] [address]  Orders Only on 10/28/2020  Component Date Value Ref Range Status   SARS Coronavirus 2 10/28/2020 RESULT: NEGATIVE   Final   Comment: RESULT: NEGATIVESARS-CoV-2 INTERPRETATION:A NEGATIVE  test result means that SARS-CoV-2 RNA was not present in the specimen above the limit of detection of this test. This does not preclude a possible SARS-CoV-2 infection and should not be used as the  sole basis for patient management decisions. Negative results must be combined with clinical observations, patient history, and epidemiological information. Optimum specimen types and timing for peak viral levels during infections caused by SARS-CoV-2  have not been determined. Collection of multiple specimens or types of specimens may be necessary to detect virus. Improper specimen collection and handling, sequence variability under primers/probes, or organism present below the limit of detection may  lead to false negative results. Positive and negative predictive values of testing are  highly dependent on prevalence. False negative test results are more likely when prevalence of disease is high.The expected result is NEGATIVE.Fact S                          heet for  Healthcare Providers: LocalChronicle.no Sheet for Patients: SalonLookup.es Reference Range - Negative   Hospital Outpatient Visit on 10/22/2020  Component Date Value Ref Range Status   Hgb A1c MFr Bld 10/22/2020 5.6  4.8 - 5.6 % Final   Comment: (NOTE) Pre diabetes:          5.7%-6.4%  Diabetes:              >6.4%  Glycemic control for   <7.0% adults with diabetes    Mean Plasma Glucose 10/22/2020 114.02  mg/dL Final   Performed at [facility], [place] [address], [place], [place] [address]   Sodium 10/22/2020 138  135 - 145 mmol/L Final   Potassium 10/22/2020 4.4  3.5 - 5.1 mmol/L Final   Chloride 10/22/2020 103  98 - 111 mmol/L Final   CO2  10/22/2020 28  22 - 32 mmol/L Final   Glucose, Bld 10/22/2020 140 (A) 70 - 99 mg/dL Final   Glucose reference range applies only to samples taken after fasting for at least 8 hours.   BUN 10/22/2020 15  6 - 20 mg/dL Final   Creatinine, Ser 10/22/2020 0.77  0.61 - 1.24 mg/dL Final   Calcium 10/22/2020 8.9  8.9 - 10.3 mg/dL Final   GFR, Estimated 10/22/2020 >60  >60 mL/min Final   Comment: (NOTE) Calculated using the CKD-EPI Creatinine Equation (2021)    Anion gap 10/22/2020 7  5 - 15 Final   Performed at [facility] [place] [address], [place], [place] [address]   WBC 10/22/2020 7.3  4.0 - 10.5 K/uL Final   RBC 10/22/2020 5.05  4.22 - 5.81 MIL/uL Final   Hemoglobin 10/22/2020 13.7  13.0 - 17.0 g/dL Final   HCT 10/22/2020 42.3  39.0 - 52.0 % Final   MCV 10/22/2020 83.8  80.0 - 100.0 fL Final   MCH 10/22/2020 27.1  26.0 - 34.0 pg Final   MCHC 10/22/2020 32.4  30.0 - 36.0 g/dL Final   RDW 10/22/2020 14.3  11.5 - 15.5 % Final   Platelets 10/22/2020 195  150 - 400 K/uL Final   nRBC 10/22/2020 0.0  0.0 - 0.2 % Final   Performed at [facility], [place] [address] [place], [place] [address]   MRSA, PCR 10/22/2020 NEGATIVE  NEGATIVE Final   Staphylococcus aureus 10/22/2020 NEGATIVE  NEGATIVE Final   Comment: (NOTE) The Xpert SA Assay (FDA approved for NASAL specimens in patients 67 years of age and older), is one component of a comprehensive surveillance program. It is not intended to diagnose infection nor to guide or monitor treatment. Performed at [facility], [place] [address] [place], [place] [address]      X-Rays:US EKG SITE RITE  Result Date: 11/01/2020 If [facility] image not attached, placement could not be confirmed due to current cardiac rhythm.   EKG: Orders placed or performed during the hospital encounter of 10/22/20   EKG 12-Lead   EKG 12-Lead     Hospital Course: Clayden Withem is a 50 y.o. who was admitted to [facility]. They were brought to the operating room on 11/01/2020 and underwent Procedure(s): [place].  Patient tolerated the procedure well and was later transferred to the recovery room and then to the orthopaedic floor for postoperative care. They were given PO and IV analgesics for pain control following their surgery. They were given 24 hours of postoperative antibiotics of  Anti-infectives (From admission, onward)    Start     Dose/Rate Route Frequency Ordered Stop   11/04/20 1000  DAPTOmycin (CUBICIN) 950 mg in sodium chloride 0.9 % IVPB        8 mg/kg  116.6 kg 138 mL/hr over 30 Minutes Intravenous  Once 11/04/20 0950 11/04/20 1150   11/04/20 0000  daptomycin (CUBICIN) IVPB  Status:  Discontinued        950 mg Intravenous Every 24 hours 11/04/20 0650 11/04/20    11/04/20 0000  daptomycin (CUBICIN) IVPB        750 mg Intravenous Every 24 hours 11/04/20 0745 12/11/20 2359   11/03/20 0000  daptomycin (CUBICIN) IVPB  Status:  Discontinued        950 mg Intravenous Every 24 hours 11/03/20 1427 11/03/20    11/03/20 0000  daptomycin (CUBICIN) IVPB  Status:  Discontinued        950 mg Intravenous Every 24 hours 11/03/20 1437 11/04/20    11/02/20 2000  DAPTOmycin (CUBICIN) 950 mg in sodium chloride 0.9 % IVPB  Status:  Discontinued        8 mg/kg  116.6 kg Intravenous Daily 11/02/20 1332 11/02/20 1338   11/02/20 1430  DAPTOmycin (CUBICIN) 950 mg in sodium chloride 0.9 % IVPB  Status:  Discontinued        8 mg/kg  116.6 kg 138 mL/hr over 30 Minutes Intravenous Daily 11/02/20 1338 11/04/20 0950   11/01/20 2130  ceFAZolin (ANCEF) IVPB 2g/100 mL premix        2 g 200 mL/hr over 30 Minutes Intravenous Every 6 hours 11/01/20 1902 11/02/20 0402   11/01/20 1533  vancomycin (VANCOCIN) powder  Status:  Discontinued          As needed 11/01/20 1534 11/01/20 1844   11/01/20 1145  ceFAZolin (ANCEF) IVPB 2g/100 mL premix        2 g 200 mL/hr over 30  Minutes Intravenous On call to O.R. 11/01/20 1139 11/01/20 1522      and started on DVT prophylaxis in the form of Aspirin.   PT and OT were ordered for total joint protocol. Discharge planning consulted to help with postop disposition and equipment needs. Patient had a good night on the evening of surgery. They started to get up OOB with therapy on POD #1. PICC line was placed. Infectious disease was consulted, and recommended d/c with daptomycin. Continued to work with therapy into POD #2. Awaiting [facility] approval for medications. Pt was seen during rounds on day three and was ready to go home pending progress with therapy. Pt worked with therapy for 0 additional sessions and was meeting their goals. He was discharged to home later that day in stable condition.  Diet: Regular diet Activity: WBAT Follow-up: in 2 weeks Disposition: Home Discharged Condition: good   Discharge Instructions     [facility] pharmacist to adjust dose for Vancomycin, Aminoglycosides and other anti-infective therapies as requested by physician.   Complete by: As directed    [facility] to provide Cath Flo 68m   Complete by: As directed    Administer for PICC line occlusion and as ordered by physician for other access device issues.   Anaphylaxis Kit: Provided  to treat any anaphylactic reaction to the medication being provided to the patient if First Dose or when requested by physician   Complete by: As directed    Epinephrine 60m/ml vial / amp: Administer 0.327m(0.66m68msubcutaneously once for moderate to severe anaphylaxis, nurse to call physician and pharmacy when reaction occurs and call 911 if needed for immediate care   Diphenhydramine 66m55m IV vial: Administer 25-66mg41mIM PRN for first dose reaction, rash, itching, mild reaction, nurse to call physician and pharmacy when reaction occurs   Sodium Chloride 0.9% NS 500ml 34mAdminister if needed for hypovolemic blood pressure drop or as  ordered by physician after call to physician with anaphylactic reaction   Call MD / Call 911   Complete by: As directed    If you experience chest pain or shortness of breath, CALL 911 and be transported to the hospital emergency room.  If you develope a fever above 101 F, pus (white drainage) or increased drainage or redness at the wound, or calf pain, call your surgeon's office.   Change dressing   Complete by: As directed    Maintain surgical dressing until follow up in the clinic. If the edges start to pull up, may reinforce with tape. If the dressing is no longer working, may remove and cover with gauze and tape, but must keep the area dry and clean.  Call with any questions or concerns.   Change dressing on IV access line weekly and PRN   Complete by: As directed    Constipation Prevention   Complete by: As directed    Drink plenty of fluids.  Prune juice may be helpful.  You may use a stool softener, such as Colace (over the counter) 100 mg twice a day.  Use MiraLax (over the counter) for constipation as needed.   Diet - low sodium heart healthy   Complete by: As directed    Flush IV access with Sodium Chloride 0.9% and Heparin 10 units/ml or 100 units/ml   Complete by: As directed    Home infusion instructions - [facility]   Complete by: As directed    Instructions: Flush IV access with Sodium Chloride 0.9% and Heparin 10units/ml or 100units/ml   Change dressing on IV access line: Weekly and PRN   Instructions Cath Flo 2mg: A766mnister for PICC Line occlusion and as ordered by physician for other access device   [facility] pharmacist to adjust dose for: Vancomycin, Aminoglycosides and other anti-infective therapies as requested by physician   Increase activity slowly as tolerated   Complete by: As directed    Weight bearing as tolerated with assist device (walker, cane, etc) as directed, use it as long as suggested by your surgeon or therapist, typically at  least 4-6 weeks.   Method of administration may be changed at the discretion of home infusion pharmacist based upon assessment of the patient and/or caregiver's ability to self-administer the medication ordered   Complete by: As directed    Post-operative opioid taper instructions:   Complete by: As directed    POST-OPERATIVE OPIOID TAPER INSTRUCTIONS: It is important to wean off of your opioid medication as soon as possible. If you do not need pain medication after your surgery it is ok to stop day one. Opioids include: Codeine, Hydrocodone(Norco, Vicodin), Oxycodone(Percocet, oxycontin) and hydromorphone amongst others.  Long term and even short term use of opiods can cause: Increased pain response Dependence Constipation Depression Respiratory depression And more.  Withdrawal symptoms can  include Flu like symptoms Nausea, vomiting And more Techniques to manage these symptoms Hydrate well Eat regular healthy meals Stay active Use relaxation techniques(deep breathing, meditating, yoga) Do Not substitute Alcohol to help with tapering If you have been on opioids for less than two weeks and do not have pain than it is ok to stop all together.  Plan to wean off of opioids This plan should start within one week post op of your joint replacement. Maintain the same interval or time between taking each dose and first decrease the dose.  Cut the total daily intake of opioids by one tablet each day Next start to increase the time between doses. The last dose that should be eliminated is the evening dose.      TED hose   Complete by: As directed    Use stockings (TED hose) for 2 weeks on both leg(s).  You may remove them at night for sleeping.      Allergies as of 11/04/2020       Reactions   Tylenol [acetaminophen] Other (See Comments)   Can't have because of liver issues        Medication List     STOP taking these medications    cephALEXin 500 MG capsule Commonly known  as: KEFLEX   diclofenac 75 MG EC tablet Commonly known as: VOLTAREN   diclofenac Sodium 1 % Gel Commonly known as: VOLTAREN   traMADol 50 MG tablet Commonly known as: ULTRAM       TAKE these medications    aspirin 81 MG chewable tablet Chew 1 tablet (81 mg total) by mouth 2 (two) times daily for 28 days.   buPROPion 300 MG 24 hr tablet Commonly known as: WELLBUTRIN XL Take 300 mg by mouth in the morning.   celecoxib 200 MG capsule Commonly known as: CELEBREX Take 1 capsule (200 mg total) by mouth 2 (two) times daily.   daptomycin  IVPB Commonly known as: CUBICIN Inject 750 mg into the vein daily. Indication:  PJI First Dose: No Last Day of Therapy:  12/10/20 Labs - Once weekly:  CBC/D, BMP, and CPK Labs - Every other week:  ESR and CRP Method of administration: IV Push Method of administration may be changed at the discretion of home infusion pharmacist based upon assessment of the patient and/or caregiver's ability to self-administer the medication ordered.   docusate sodium 100 MG capsule Commonly known as: COLACE Take 1 capsule (100 mg total) by mouth 2 (two) times daily.   DULoxetine 60 MG capsule Commonly known as: CYMBALTA Take 60 mg by mouth in the morning.   enalapril 10 MG tablet Commonly known as: VASOTEC Take 10 mg by mouth in the morning.   hydrOXYzine 25 MG tablet Commonly known as: ATARAX/VISTARIL Take 25-50 mg by mouth See admin instructions. Take 1 tablet (25 mg) by mouth at supper & take 2 tablets (50 mg) by mouth at bedtime.   melatonin 3 MG Tabs tablet Take 3 mg by mouth at bedtime.   methocarbamol 500 MG tablet Commonly known as: ROBAXIN Take 1 tablet (500 mg total) by mouth every 6 (six) hours as needed for muscle spasms.   metoprolol tartrate 100 MG tablet Commonly known as: LOPRESSOR Take 100 mg by mouth 2 (two) times daily.   multivitamin with minerals tablet Take 1 tablet by mouth daily.   omeprazole 20 MG capsule Commonly  known as: PRILOSEC Take 20 mg by mouth in the morning.   oxyCODONE 5 MG immediate  release tablet Commonly known as: Oxy IR/ROXICODONE Take 1-2 tablets (5-10 mg total) by mouth every 4 (four) hours as needed for severe pain.   polyethylene glycol 17 g packet Commonly known as: MIRALAX / GLYCOLAX Take 17 g by mouth daily as needed for mild constipation.   traZODone 100 MG tablet Commonly known as: DESYREL Take 200 mg by mouth at bedtime.   verapamil 120 MG CR tablet Commonly known as: CALAN-SR Take 120 mg by mouth in the morning.               Discharge Care Instructions  (From admission, onward)           Start     Ordered   11/03/20 0000  Change dressing       Comments: Maintain surgical dressing until follow up in the clinic. If the edges start to pull up, may reinforce with tape. If the dressing is no longer working, may remove and cover with gauze and tape, but must keep the area dry and clean.  Call with any questions or concerns.   11/03/20 1427   11/03/20 0000  Change dressing on IV access line weekly and PRN  (Home infusion instructions - [facility] )        11/03/20 1427            Follow-up Information     Paralee Cancel, MD. Schedule an appointment as soon as possible for a visit in 2 week(s).   Specialty: Orthopedic Surgery Contact information: [address] [place] [address] [phone]         [facility] Follow up.   Why: IV antibiotics        [facility] Follow up.   Specialty: Home Health Services Why: HHRN                Signed: Griffith Citron, PA-C Orthopedic Surgery 11/15/2020, 7:40 AM

## 2020-12-02 ENCOUNTER — Ambulatory Visit: Payer: No Typology Code available for payment source | Admitting: Infectious Disease

## 2020-12-06 ENCOUNTER — Encounter: Payer: Self-pay | Admitting: Infectious Disease

## 2020-12-06 ENCOUNTER — Other Ambulatory Visit (HOSPITAL_COMMUNITY): Payer: Self-pay

## 2020-12-06 ENCOUNTER — Ambulatory Visit (INDEPENDENT_AMBULATORY_CARE_PROVIDER_SITE_OTHER): Payer: No Typology Code available for payment source

## 2020-12-06 ENCOUNTER — Ambulatory Visit (INDEPENDENT_AMBULATORY_CARE_PROVIDER_SITE_OTHER): Payer: No Typology Code available for payment source | Admitting: Infectious Disease

## 2020-12-06 ENCOUNTER — Other Ambulatory Visit: Payer: Self-pay

## 2020-12-06 VITALS — BP 151/68 | HR 70 | Temp 98.1°F | Wt 263.0 lb

## 2020-12-06 DIAGNOSIS — T8459XD Infection and inflammatory reaction due to other internal joint prosthesis, subsequent encounter: Secondary | ICD-10-CM

## 2020-12-06 DIAGNOSIS — Z7185 Encounter for immunization safety counseling: Secondary | ICD-10-CM | POA: Diagnosis not present

## 2020-12-06 DIAGNOSIS — T8459XA Infection and inflammatory reaction due to other internal joint prosthesis, initial encounter: Secondary | ICD-10-CM | POA: Insufficient documentation

## 2020-12-06 DIAGNOSIS — Z89529 Acquired absence of unspecified knee: Secondary | ICD-10-CM

## 2020-12-06 DIAGNOSIS — Z23 Encounter for immunization: Secondary | ICD-10-CM

## 2020-12-06 DIAGNOSIS — A498 Other bacterial infections of unspecified site: Secondary | ICD-10-CM

## 2020-12-06 DIAGNOSIS — Z96659 Presence of unspecified artificial knee joint: Secondary | ICD-10-CM

## 2020-12-06 HISTORY — DX: Infection and inflammatory reaction due to other internal joint prosthesis, initial encounter: T84.59XA

## 2020-12-06 HISTORY — DX: Presence of unspecified artificial knee joint: Z96.659

## 2020-12-06 HISTORY — DX: Other bacterial infections of unspecified site: A49.8

## 2020-12-06 MED ORDER — CLINDAMYCIN HCL 300 MG PO CAPS: 300.0000 mg | ORAL_CAPSULE | Freq: Four times a day (QID) | ORAL | 11 refills | Status: DC

## 2020-12-06 NOTE — Progress Notes (Signed)
Per MD called [facility] with verbal order to pull picc Friday 10/7 after last dose of antbx. Spoke with Latosha at [facility] who was able to take orders. Will update pharmacy team and nursing.  Orders repeated and confirmed before ending call. Juanita Laster, RMA

## 2020-12-06 NOTE — Progress Notes (Signed)
   Covid-19 Vaccination Clinic  Name:  Thomas [name]    MRN: [id number] DOB: [birth year]  12/06/2020  Mr. Thomas [name] was observed post Covid-19 immunization for 15 minutes without incident. He was provided with Vaccine Information Sheet and instruction to access the V-Safe system.   Mr. Thomas [name] was instructed to call 911 with any severe reactions post vaccine: Difficulty breathing  Swelling of face and throat  A fast heartbeat  A bad rash all over body  Dizziness and weakness     Juanita Laster, RMA

## 2020-12-06 NOTE — Progress Notes (Signed)
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  Again methicillin-resistant Staph epidermidis was isolated.  We placed him on IV daptomycin and he is due to finish antibiotics this Friday.  His knee pain is improved dramatically since we saw him in the hospital.  He has some pain largely in the morning when he gets up to walk for a few steps but this improves throughout the day.  He does not have fevers chills other systemic symptoms his PICC line is in place and without evidence of infection.  He is going to [place] this coming Sunday.  Unfortunately do not have many different options to use for oral therapy for him other than clindamycin and potentially some more expensive options such as nuzyra or sivextro which would require prior authorizations I suspect at [place].   Past Medical History:  Diagnosis Date   Anxiety    occasionally   Arthritis    Chest pain    Dysrhythmia    SVT   GERD (gastroesophageal reflux disease)    HTN (hypertension)    Pre-diabetes    Sleep apnea    cpap    SVT (supraventricular tachycardia) ([place])     Past Surgical History:  Procedure Laterality Date   ATRIAL ABLATION SURGERY     CONVERSION TO TOTAL KNEE Left 09/25/2017   Procedure: Left knee conversion from uni compartmental arthroplasty to total knee arthroplasty;  Surgeon: Paralee Cancel, MD;  Location: [facility];  Service: Orthopedics;  Laterality: Left;  90 mins   EXCISIONAL TOTAL KNEE ARTHROPLASTY WITH ANTIBIOTIC SPACERS Left 09/17/2018   Procedure: Resection of left total knee arthroplasty and placement of antibiotic spacer;  Surgeon: Paralee Cancel, MD;  Location: [facility];  Service: Orthopedics;  Laterality:  Left;  90 mins   I & D KNEE WITH POLY EXCHANGE Left 07/16/2017   Procedure: LEFT KNEE [place] OUT WITH REIMPLANTATION OF EXISTING IMPLANT AFTER SALINE CLEANING.;  Surgeon: Paralee Cancel, MD;  Location: [facility];  Service: Orthopedics;  Laterality: Left;  with block   I & D KNEE WITH POLY EXCHANGE Left 11/01/2020   Procedure: IRRIGATION AND DEBRIDEMENT KNEE WITH POLY EXCHANGE;  Surgeon: Paralee Cancel, MD;  Location: [facility];  Service: Orthopedics;  Laterality: Left;   JOINT REPLACEMENT     total knee arthroplasty 09-25-17 Dr. Alvan Dame   ORIF TIBIA PLATEAU Left 06/12/2017   Procedure: Left open reduction internal fixation medial tibial plateau fracture with poly revision medial compartmental arthroplasty;  Surgeon: Paralee Cancel, MD;  [facility]: [facility];  Service: Orthopedics;  Laterality: Left;  90 mins   REIMPLANTATION OF TOTAL KNEE Left 12/26/2018   Procedure: REIMPLANTATION OF TOTAL KNEE;  Surgeon: Paralee Cancel, MD;  Location[facility];  Service: Orthopedics;  Laterality: Left;  90 mins    No family history on file.    Social History   Socioeconomic History   Marital status: Married    Spouse name: Not on file   Number of children: 3   Years of education: Not on file   Highest education level: Not on file  Occupational History   Occupation: truck driver  Tobacco Use   Smoking status: Never   Smokeless tobacco: Former  Types: Chew, Snuff    Quit date: 2019   Tobacco comments:    30 years ago   Vaping Use   Vaping Use: Never used  Substance and Sexual Activity   Alcohol use: Not Currently    Alcohol/week: 12.0 standard drinks    Types: 12 Standard drinks or equivalent per week    Comment: no   Drug use: No   Sexual activity: Yes  Other Topics Concern   Not on file  Social History Narrative   Not on file   Social Determinants of Health   Financial Resource Strain: Not on file  Food Insecurity: Not on file  Transportation Needs: Not on file  Physical Activity: Not on file  Stress:  Not on file  Social Connections: Not on file    Allergies  Allergen Reactions   Tylenol [Acetaminophen] Other (See Comments)    Can't have because of liver issues     Current Outpatient Medications:    buPROPion (WELLBUTRIN XL) 300 MG 24 hr tablet, Take 300 mg by mouth in the morning., Disp: , Rfl:    celecoxib (CELEBREX) 200 MG capsule, Take 1 capsule (200 mg total) by mouth 2 (two) times daily., Disp: 60 capsule, Rfl: 0   daptomycin (CUBICIN) IVPB, Inject 750 mg into the vein daily. Indication:  PJI First Dose: No Last Day of Therapy:  12/10/20 Labs - Once weekly:  CBC/D, BMP, and CPK Labs - Every other week:  ESR and CRP Method of administration: IV Push Method of administration may be changed at the discretion of home infusion pharmacist based upon assessment of the patient and/or caregiver's ability to self-administer the medication ordered., Disp: 38 Units, Rfl: 0   docusate sodium (COLACE) 100 MG capsule, Take 1 capsule (100 mg total) by mouth 2 (two) times daily., Disp: 10 capsule, Rfl: 0   DULoxetine (CYMBALTA) 60 MG capsule, Take 60 mg by mouth in the morning., Disp: , Rfl:    enalapril (VASOTEC) 10 MG tablet, Take 10 mg by mouth in the morning., Disp: , Rfl:    hydrOXYzine (ATARAX/VISTARIL) 25 MG tablet, Take 25-50 mg by mouth See admin instructions. Take 1 tablet (25 mg) by mouth at supper & take 2 tablets (50 mg) by mouth at bedtime., Disp: , Rfl:    melatonin 3 MG TABS tablet, Take 3 mg by mouth at bedtime., Disp: , Rfl:    methocarbamol (ROBAXIN) 500 MG tablet, Take 1 tablet (500 mg total) by mouth every 6 (six) hours as needed for muscle spasms., Disp: 40 tablet, Rfl: 0   metoprolol tartrate (LOPRESSOR) 100 MG tablet, Take 100 mg by mouth 2 (two) times daily., Disp: , Rfl:    Multiple Vitamins-Minerals (MULTIVITAMIN WITH MINERALS) tablet, Take 1 tablet by mouth daily., Disp: , Rfl:    omeprazole (PRILOSEC) 20 MG capsule, Take 20 mg by mouth in the morning., Disp: , Rfl:     oxyCODONE (OXY IR/ROXICODONE) 5 MG immediate release tablet, Take 1-2 tablets (5-10 mg total) by mouth every 4 (four) hours as needed for severe pain., Disp: 56 tablet, Rfl: 0   polyethylene glycol (MIRALAX / GLYCOLAX) 17 g packet, Take 17 g by mouth daily as needed for mild constipation., Disp: 14 each, Rfl: 0   traZODone (DESYREL) 100 MG tablet, Take 200 mg by mouth at bedtime., Disp: , Rfl:    verapamil (CALAN-SR) 120 MG CR tablet, Take 120 mg by mouth in the morning., Disp: , Rfl:     Review of Systems  Constitutional:  Negative for activity change, appetite change, chills, diaphoresis, fatigue, fever and unexpected weight change.  HENT:  Negative for congestion, rhinorrhea, sinus pressure, sneezing, sore throat and trouble swallowing.   Eyes:  Negative for photophobia and visual disturbance.  Respiratory:  Negative for cough, chest tightness, shortness of breath, wheezing and stridor.   Cardiovascular:  Negative for chest pain, palpitations and leg swelling.  Gastrointestinal:  Negative for abdominal distention, abdominal pain, anal bleeding, blood in stool, constipation, diarrhea, nausea and vomiting.  Genitourinary:  Negative for difficulty urinating, dysuria, flank pain and hematuria.  Musculoskeletal:  Positive for myalgias. Negative for arthralgias, back pain, gait problem and joint swelling.  Skin:  Negative for color change, pallor, rash and wound.  Neurological:  Negative for dizziness, tremors, weakness and light-headedness.  Hematological:  Negative for adenopathy. Does not bruise/bleed easily.  Psychiatric/Behavioral:  Negative for agitation, behavioral problems, confusion, decreased concentration, dysphoric mood and sleep disturbance.       Objective:   Physical Exam Constitutional:      Appearance: He is well-developed.  HENT:     Head: Normocephalic and atraumatic.  Eyes:     Conjunctiva/sclera: Conjunctivae normal.  Cardiovascular:     Rate and Rhythm: Normal rate  and regular rhythm.  Pulmonary:     Effort: Pulmonary effort is normal. No respiratory distress.     Breath sounds: No wheezing.  Abdominal:     General: There is no distension.     Palpations: Abdomen is soft.  Musculoskeletal:        General: Tenderness present.     Cervical back: Normal range of motion and neck supple.  Skin:    General: Skin is warm and dry.     Coloration: Skin is not pale.     Findings: No erythema or rash.  Neurological:     General: No focal deficit present.     Mental Status: He is alert and oriented to person, place, and time.  Psychiatric:        Mood and Affect: Mood normal.        Behavior: Behavior normal.        Thought Content: Thought content normal.        Judgment: Judgment normal.    Left knee 12/06/2020:     PICC 12/06/2020:          Assessment & Plan:   PJI with MRSE:  Complete daptomycin this Friday and then have PICC line removed.  Start clindamycin at that point 300 mg 4 times daily.  I have counseled him extensively about the risk of C. difficile.  Certainly if he develops C. difficile we will need to abandon clindamycin and find another option hopefully the [facility] will pay for an alternative treatment such as sivextro or nuzyra in that case  I will see him back in 2 months  Vaccine counseling: we gave bivalanet COVID 19 vaccine and flu shot today  I spent 93mnutes with the patient including face to face counseling of the patient re his PJI, personally reviewing CXR from PICC placmenet, intraoperative culturs, along with  review of medical records before and during the visit and in coordination of his care.

## 2021-02-09 DIAGNOSIS — R509 Fever, unspecified: Secondary | ICD-10-CM | POA: Diagnosis not present

## 2021-02-09 DIAGNOSIS — J111 Influenza due to unidentified influenza virus with other respiratory manifestations: Secondary | ICD-10-CM | POA: Diagnosis not present

## 2021-02-11 ENCOUNTER — Ambulatory Visit: Payer: No Typology Code available for payment source | Admitting: Infectious Disease

## 2021-02-14 ENCOUNTER — Ambulatory Visit (INDEPENDENT_AMBULATORY_CARE_PROVIDER_SITE_OTHER): Payer: No Typology Code available for payment source | Admitting: Infectious Disease

## 2021-02-14 ENCOUNTER — Encounter: Payer: Self-pay | Admitting: Infectious Disease

## 2021-02-14 ENCOUNTER — Other Ambulatory Visit: Payer: Self-pay

## 2021-02-14 VITALS — BP 145/97 | HR 63 | Temp 98.2°F | Wt 264.0 lb

## 2021-02-14 DIAGNOSIS — A498 Other bacterial infections of unspecified site: Secondary | ICD-10-CM

## 2021-02-14 DIAGNOSIS — J111 Influenza due to unidentified influenza virus with other respiratory manifestations: Secondary | ICD-10-CM | POA: Diagnosis not present

## 2021-02-14 DIAGNOSIS — Z7185 Encounter for immunization safety counseling: Secondary | ICD-10-CM

## 2021-02-14 DIAGNOSIS — T8459XD Infection and inflammatory reaction due to other internal joint prosthesis, subsequent encounter: Secondary | ICD-10-CM

## 2021-02-14 DIAGNOSIS — Z96659 Presence of unspecified artificial knee joint: Secondary | ICD-10-CM

## 2021-02-14 NOTE — Progress Notes (Signed)
Chief complaint:  Thomas [name] is recovering from influenza still with some residual fatigue.  He is here for follow-up for his prosthetic knee infection on antibiotics   Patient ID: Thomas [name], male    DOB: 03-20-1970, 50 y.o.   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  Again methicillin-resistant Staph epidermidis was isolated.  We placed him on IV daptomycinHis knee pain is improved dramatically since we saw him in the hospital.  He has some pain largely in the morning when he gets up to walk for a few steps but this improves throughout the day.  He does not have fevers chills other systemic symptoms his PICC line is in place and without evidence of infection.  Unfortunately do not have many different options to use for oral therapy for him other than clindamycin and potentially some more expensive options such as nuzyra or sivextro which would require prior authorizations I suspect at [place].  There   Past Medical History:  Diagnosis Date   Anxiety    occasionally   Arthritis    Chest pain    Chronic infection of prosthetic knee ([place]) 12/06/2020   Dysrhythmia    SVT   GERD (gastroesophageal reflux disease)    HTN (hypertension)    Pre-diabetes    Sleep apnea    cpap    Staphylococcus epidermidis infection 12/06/2020   SVT (supraventricular tachycardia) ([place])     Past Surgical History:  Procedure Laterality Date   ATRIAL ABLATION SURGERY     CONVERSION TO TOTAL KNEE Left 09/25/2017   Procedure: Left knee conversion from uni compartmental arthroplasty to total knee arthroplasty;  Surgeon: Paralee Cancel, MD;  [facility];  Service: Orthopedics;  Laterality: Left;  90 mins   EXCISIONAL TOTAL KNEE ARTHROPLASTY WITH ANTIBIOTIC SPACERS Left 09/17/2018   Procedure: Resection of left total knee arthroplasty and  placement of antibiotic spacer;  Surgeon: Paralee Cancel, MD;  [facility];  Service: Orthopedics;  Laterality: Left;  90 mins   I & D KNEE WITH POLY EXCHANGE Left 07/16/2017   Procedure: LEFT KNEE [place] OUT WITH REIMPLANTATION OF EXISTING IMPLANT AFTER SALINE CLEANING.;  Surgeon: Paralee Cancel, MD;  [facility];  Service: Orthopedics;  Laterality: Left;  with block   I & D KNEE WITH POLY EXCHANGE Left 11/01/2020   Procedure: IRRIGATION AND DEBRIDEMENT KNEE WITH POLY EXCHANGE;  Surgeon: Paralee Cancel, MD;  [facility];  Service: Orthopedics;  Laterality: Left;   JOINT REPLACEMENT     total knee arthroplasty 09-25-17 Dr. Alvan Dame   ORIF TIBIA PLATEAU Left 06/12/2017   Procedure: Left open reduction internal fixation medial tibial plateau fracture with poly revision medial compartmental arthroplasty;  Surgeon: Paralee Cancel, MD;  [facility];  Service: Orthopedics;  Laterality: Left;  90 mins   REIMPLANTATION OF TOTAL KNEE Left 12/26/2018   Procedure: REIMPLANTATION OF TOTAL KNEE;  Surgeon: Paralee Cancel, MD;  [facility];  Service: Orthopedics;  Laterality: Left;  90 mins    No family history on file.    Social History   Socioeconomic History   Marital status: Married    Spouse name: Not on file   Number of children: 3   Years of education: Not on file   Highest education level: Not on file  Occupational History   Occupation: truck driver  Tobacco Use  Smoking status: Never   Smokeless tobacco: Former    Types: Chew, Snuff    Quit date: 2019   Tobacco comments:    30 years ago   Vaping Use   Vaping Use: Never used  Substance and Sexual Activity   Alcohol use: Not Currently    Alcohol/week: 12.0 standard drinks    Types: 12 Standard drinks or equivalent per week    Comment: no   Drug use: No   Sexual activity: Yes  Other Topics Concern   Not on file  Social History Narrative   Not on file   Social Determinants of Health   Financial Resource Strain: Not on  file  Food Insecurity: Not on file  Transportation Needs: Not on file  Physical Activity: Not on file  Stress: Not on file  Social Connections: Not on file    Allergies  Allergen Reactions   Tylenol [Acetaminophen] Other (See Comments)    Can't have because of liver issues     Current Outpatient Medications:    buPROPion (WELLBUTRIN XL) 300 MG 24 hr tablet, Take 300 mg by mouth in the morning., Disp: , Rfl:    celecoxib (CELEBREX) 200 MG capsule, Take 1 capsule (200 mg total) by mouth 2 (two) times daily., Disp: 60 capsule, Rfl: 0   clindamycin (CLEOCIN) 300 MG capsule, Take 1 capsule (300 mg total) by mouth 4 (four) times daily., Disp: 120 capsule, Rfl: 11   docusate sodium (COLACE) 100 MG capsule, Take 1 capsule (100 mg total) by mouth 2 (two) times daily., Disp: 10 capsule, Rfl: 0   DULoxetine (CYMBALTA) 60 MG capsule, Take 60 mg by mouth in the morning., Disp: , Rfl:    enalapril (VASOTEC) 10 MG tablet, Take 10 mg by mouth in the morning., Disp: , Rfl:    hydrOXYzine (ATARAX/VISTARIL) 25 MG tablet, Take 25-50 mg by mouth See admin instructions. Take 1 tablet (25 mg) by mouth at supper & take 2 tablets (50 mg) by mouth at bedtime., Disp: , Rfl:    melatonin 3 MG TABS tablet, Take 3 mg by mouth at bedtime., Disp: , Rfl:    methocarbamol (ROBAXIN) 500 MG tablet, Take 1 tablet (500 mg total) by mouth every 6 (six) hours as needed for muscle spasms., Disp: 40 tablet, Rfl: 0   metoprolol tartrate (LOPRESSOR) 100 MG tablet, Take 100 mg by mouth 2 (two) times daily., Disp: , Rfl:    Multiple Vitamins-Minerals (MULTIVITAMIN WITH MINERALS) tablet, Take 1 tablet by mouth daily., Disp: , Rfl:    omeprazole (PRILOSEC) 20 MG capsule, Take 20 mg by mouth in the morning., Disp: , Rfl:    polyethylene glycol (MIRALAX / GLYCOLAX) 17 g packet, Take 17 g by mouth daily as needed for mild constipation., Disp: 14 each, Rfl: 0   traZODone (DESYREL) 100 MG tablet, Take 200 mg by mouth at bedtime., Disp: ,  Rfl:    verapamil (CALAN-SR) 120 MG CR tablet, Take 120 mg by mouth in the morning., Disp: , Rfl:    oxyCODONE (OXY IR/ROXICODONE) 5 MG immediate release tablet, Take 1-2 tablets (5-10 mg total) by mouth every 4 (four) hours as needed for severe pain. (Patient not taking: Reported on 02/14/2021), Disp: 56 tablet, Rfl: 0    Review of Systems  Constitutional:  Positive for fatigue and fever. Negative for activity change, appetite change, chills, diaphoresis and unexpected weight change.  HENT:  Negative for congestion, rhinorrhea, sinus pressure, sneezing, sore throat and trouble swallowing.   Eyes:  Negative for photophobia and visual disturbance.  Respiratory:  Positive for cough. Negative for chest tightness, shortness of breath, wheezing and stridor.   Cardiovascular:  Negative for chest pain, palpitations and leg swelling.  Gastrointestinal:  Negative for abdominal distention, abdominal pain, anal bleeding, blood in stool, constipation, diarrhea, nausea and vomiting.  Genitourinary:  Negative for difficulty urinating, dysuria, flank pain and hematuria.  Musculoskeletal:  Positive for arthralgias and myalgias. Negative for back pain, gait problem and joint swelling.  Skin:  Negative for color change, pallor, rash and wound.  Neurological:  Negative for dizziness, tremors, weakness and light-headedness.  Hematological:  Negative for adenopathy. Does not bruise/bleed easily.  Psychiatric/Behavioral:  Negative for agitation, behavioral problems, confusion, decreased concentration, dysphoric mood and sleep disturbance.       Objective:   Physical Exam Constitutional:      Appearance: He is well-developed.  HENT:     Head: Normocephalic and atraumatic.  Eyes:     Conjunctiva/sclera: Conjunctivae normal.  Cardiovascular:     Rate and Rhythm: Normal rate and regular rhythm.  Pulmonary:     Effort: Pulmonary effort is normal. No respiratory distress.     Breath sounds: No wheezing.   Abdominal:     General: There is no distension.     Palpations: Abdomen is soft.  Musculoskeletal:        General: Swelling present. No tenderness.     Cervical back: Normal range of motion and neck supple.  Skin:    General: Skin is warm and dry.     Coloration: Skin is not pale.     Findings: No erythema or rash.  Neurological:     General: No focal deficit present.     Mental Status: He is alert and oriented to person, place, and time.  Psychiatric:        Mood and Affect: Mood normal.        Behavior: Behavior normal.        Thought Content: Thought content normal.        Judgment: Judgment normal.    Left knee 12/06/2020:     Left knee February 14, 2021:          Assessment & Plan:   Recurrent prosthetic joint infection with MRSA status post single-stage revision:  Completed daptomycin will continue clindamycin that we switched him to when we went to oral therapy  I am checking a sed rate CRP metabolic panel and CBC with differential.  Again counseled on the risk of C. difficile colitis.  Will try to get through at least 6 months of postoperative therapy.   Influenza: He is recovered from this received Tamiflu.  This reason infection could potentially confound some of the inflammatory markers.  Vaccine counseling he is up-to-date on his vaccines.

## 2021-02-15 LAB — COMPLETE METABOLIC PANEL WITH GFR
AG Ratio: 1.5 (calc) (ref 1.0–2.5)
ALT: 55 U/L — ABNORMAL HIGH (ref 9–46)
AST: 42 U/L — ABNORMAL HIGH (ref 10–35)
Albumin: 4.2 g/dL (ref 3.6–5.1)
Alkaline phosphatase (APISO): 70 U/L (ref 35–144)
BUN: 19 mg/dL (ref 7–25)
CO2: 30 mmol/L (ref 20–32)
Calcium: 9 mg/dL (ref 8.6–10.3)
Chloride: 102 mmol/L (ref 98–110)
Creat: 0.8 mg/dL (ref 0.70–1.30)
Globulin: 2.8 g/dL (calc) (ref 1.9–3.7)
Glucose, Bld: 93 mg/dL (ref 65–99)
Potassium: 4.6 mmol/L (ref 3.5–5.3)
Sodium: 138 mmol/L (ref 135–146)
Total Bilirubin: 0.6 mg/dL (ref 0.2–1.2)
Total Protein: 7 g/dL (ref 6.1–8.1)
eGFR: 106 mL/min/{1.73_m2} (ref >=60)

## 2021-02-15 LAB — SEDIMENTATION RATE: Sed Rate: 9 mm/h (ref 0–20)

## 2021-02-15 LAB — CBC WITH DIFFERENTIAL/PLATELET
Absolute Monocytes: 525 cells/uL (ref 200–950)
Basophils Absolute: 59 cells/uL (ref 0–200)
Basophils Relative: 0.8 %
Eosinophils Absolute: 289 cells/uL (ref 15–500)
Eosinophils Relative: 3.9 %
HCT: 43.8 % (ref 38.5–50.0)
Hemoglobin: 14.6 g/dL (ref 13.2–17.1)
Lymphs Abs: 2472 cells/uL (ref 850–3900)
MCH: 26.6 pg — ABNORMAL LOW (ref 27.0–33.0)
MCHC: 33.3 g/dL (ref 32.0–36.0)
MCV: 79.8 fL — ABNORMAL LOW (ref 80.0–100.0)
MPV: 9.9 fL (ref 7.5–12.5)
Monocytes Relative: 7.1 %
Neutro Abs: 4055 cells/uL (ref 1500–7800)
Neutrophils Relative %: 54.8 %
Platelets: 174 10*3/uL (ref 140–400)
RBC: 5.49 10*6/uL (ref 4.20–5.80)
RDW: 15.3 % — ABNORMAL HIGH (ref 11.0–15.0)
Total Lymphocyte: 33.4 %
WBC: 7.4 10*3/uL (ref 3.8–10.8)

## 2021-02-15 LAB — C-REACTIVE PROTEIN: CRP: 3.2 mg/L (ref <8.0)

## 2021-05-12 ENCOUNTER — Encounter: Payer: Self-pay | Admitting: Infectious Disease

## 2021-05-12 ENCOUNTER — Other Ambulatory Visit: Payer: Self-pay

## 2021-05-12 ENCOUNTER — Ambulatory Visit (INDEPENDENT_AMBULATORY_CARE_PROVIDER_SITE_OTHER): Payer: No Typology Code available for payment source | Admitting: Infectious Disease

## 2021-05-12 VITALS — BP 130/79 | HR 68 | Temp 97.5°F | Ht 70.0 in | Wt 264.0 lb

## 2021-05-12 DIAGNOSIS — Z96659 Presence of unspecified artificial knee joint: Secondary | ICD-10-CM

## 2021-05-12 DIAGNOSIS — A498 Other bacterial infections of unspecified site: Secondary | ICD-10-CM | POA: Diagnosis not present

## 2021-05-12 DIAGNOSIS — T8459XD Infection and inflammatory reaction due to other internal joint prosthesis, subsequent encounter: Secondary | ICD-10-CM | POA: Diagnosis not present

## 2021-05-12 NOTE — Progress Notes (Signed)
Chief complaint:  He is still having pain mainly at night when he gets into bed and tries to position his leg or if he sits for prolonged periods of time and is trying to get up to move.   Patient ID: Thomas [name], male    DOB: [birth year], 51 y.o.   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  Again methicillin-resistant Staph epidermidis was isolated.  We placed him on IV daptomycinHis knee pain is improved dramatically since we saw him in the hospital.  He has some pain largely in the morning when he gets up to walk for a few steps but this improves throughout the day.  He does not have fevers chills other systemic symptoms his PICC line is in place and without evidence of infection.  Unfortunately do not have many different options to use for oral therapy for him other than clindamycin and potentially some more expensive options such as nuzyra or sivextro which would require prior authorizations I suspect at [place].   He has tolerated the clindamycin without difficulty.  He is adherent to taking it 4 times a day.  Knee pain seems stable compared to when I saw him last and he has gotten through 6 months of treatment.     Past Medical History:  Diagnosis Date   Anxiety    occasionally   Arthritis    Chest pain    Chronic infection of prosthetic knee ([place]) 12/06/2020   Dysrhythmia    SVT   GERD (gastroesophageal reflux disease)    HTN (hypertension)    Pre-diabetes    Sleep apnea    cpap    Staphylococcus epidermidis infection 12/06/2020   SVT (supraventricular tachycardia) ([place])     Past Surgical History:  Procedure Laterality Date   ATRIAL ABLATION SURGERY     CONVERSION TO TOTAL KNEE Left 09/25/2017   Procedure: Left knee conversion from uni compartmental arthroplasty to total knee arthroplasty;  Surgeon:  Paralee Cancel, MD;  Location: [facility];  Service: Orthopedics;  Laterality: Left;  90 mins   EXCISIONAL TOTAL KNEE ARTHROPLASTY WITH ANTIBIOTIC SPACERS Left 09/17/2018   Procedure: Resection of left total knee arthroplasty and placement of antibiotic spacer;  Surgeon: Paralee Cancel, MD;  Location: [facility];  Service: Orthopedics;  Laterality: Left;  90 mins   I & D KNEE WITH POLY EXCHANGE Left 07/16/2017   Procedure: LEFT KNEE [place] OUT WITH REIMPLANTATION OF EXISTING IMPLANT AFTER SALINE CLEANING.;  Surgeon: Paralee Cancel, MD;  Location: [facility];  Service: Orthopedics;  Laterality: Left;  with block   I & D KNEE WITH POLY EXCHANGE Left 11/01/2020   Procedure: IRRIGATION AND DEBRIDEMENT KNEE WITH POLY EXCHANGE;  Surgeon: Paralee Cancel, MD;  [facility]: [facility];  Service: Orthopedics;  Laterality: Left;   JOINT REPLACEMENT     total knee arthroplasty 09-25-17 Dr. Alvan Dame   ORIF TIBIA PLATEAU Left 06/12/2017   Procedure: Left open reduction internal fixation medial tibial plateau fracture with poly revision medial compartmental arthroplasty;  Surgeon: Paralee Cancel, MD;  [facility]: [facility];  Service: Orthopedics;  Laterality: Left;  90 mins   REIMPLANTATION OF TOTAL KNEE Left 12/26/2018   Procedure: REIMPLANTATION OF TOTAL KNEE;  Surgeon: Paralee Cancel, MD;  [facility]: [facility];  Service: Orthopedics;  Laterality: Left;  90 mins    No family history on file.  Social History   Socioeconomic History   Marital status: Married    Spouse name: Not on file   Number of children: 3   Years of education: Not on file   Highest education level: Not on file  Occupational History   Occupation: truck driver  Tobacco Use   Smoking status: Never   Smokeless tobacco: Former    Types: Chew, Snuff    Quit date: 2019   Tobacco comments:    30 years ago   Vaping Use   Vaping Use: Never used  Substance and Sexual Activity   Alcohol use: Not Currently    Alcohol/week: 12.0 standard drinks    Types: 12 Standard drinks or  equivalent per week    Comment: no   Drug use: No   Sexual activity: Yes  Other Topics Concern   Not on file  Social History Narrative   Not on file   Social Determinants of Health   Financial Resource Strain: Not on file  Food Insecurity: Not on file  Transportation Needs: Not on file  Physical Activity: Not on file  Stress: Not on file  Social Connections: Not on file    Allergies  Allergen Reactions   Tylenol [Acetaminophen] Other (See Comments)    Can't have because of liver issues     Current Outpatient Medications:    buPROPion (WELLBUTRIN XL) 300 MG 24 hr tablet, Take 300 mg by mouth in the morning., Disp: , Rfl:    celecoxib (CELEBREX) 200 MG capsule, Take 1 capsule (200 mg total) by mouth 2 (two) times daily., Disp: 60 capsule, Rfl: 0   clindamycin (CLEOCIN) 300 MG capsule, Take 1 capsule (300 mg total) by mouth 4 (four) times daily., Disp: 120 capsule, Rfl: 11   DULoxetine (CYMBALTA) 60 MG capsule, Take 60 mg by mouth in the morning., Disp: , Rfl:    enalapril (VASOTEC) 10 MG tablet, Take 10 mg by mouth in the morning., Disp: , Rfl:    hydrOXYzine (ATARAX/VISTARIL) 25 MG tablet, Take 25-50 mg by mouth See admin instructions. Take 1 tablet (25 mg) by mouth at supper & take 2 tablets (50 mg) by mouth at bedtime., Disp: , Rfl:    melatonin 3 MG TABS tablet, Take 3 mg by mouth at bedtime., Disp: , Rfl:    metoprolol tartrate (LOPRESSOR) 100 MG tablet, Take 100 mg by mouth 2 (two) times daily., Disp: , Rfl:    Multiple Vitamins-Minerals (MULTIVITAMIN WITH MINERALS) tablet, Take 1 tablet by mouth daily., Disp: , Rfl:    omeprazole (PRILOSEC) 20 MG capsule, Take 20 mg by mouth in the morning., Disp: , Rfl:    tamsulosin (FLOMAX) 0.4 MG CAPS capsule, Take 0.4 mg by mouth daily., Disp: , Rfl:    traMADol (ULTRAM) 50 MG tablet, TAKE ONE TABLET BY MOUTH THREE TIMES A DAY FOR CHRONIC PAIN, Disp: , Rfl:    traZODone (DESYREL) 100 MG tablet, Take 200 mg by mouth at bedtime., Disp:  , Rfl:    verapamil (CALAN-SR) 120 MG CR tablet, Take 120 mg by mouth in the morning., Disp: , Rfl:    docusate sodium (COLACE) 100 MG capsule, Take 1 capsule (100 mg total) by mouth 2 (two) times daily. (Patient not taking: Reported on 05/12/2021), Disp: 10 capsule, Rfl: 0   methocarbamol (ROBAXIN) 500 MG tablet, Take 1 tablet (500 mg total) by mouth every 6 (six) hours as needed for muscle spasms. (Patient not taking: Reported on 05/12/2021), Disp: 40 tablet, Rfl: 0  oxyCODONE (OXY IR/ROXICODONE) 5 MG immediate release tablet, Take 1-2 tablets (5-10 mg total) by mouth every 4 (four) hours as needed for severe pain. (Patient not taking: Reported on 02/14/2021), Disp: 56 tablet, Rfl: 0   polyethylene glycol (MIRALAX / GLYCOLAX) 17 g packet, Take 17 g by mouth daily as needed for mild constipation. (Patient not taking: Reported on 05/12/2021), Disp: 14 each, Rfl: 0    Review of Systems  Constitutional:  Negative for activity change, appetite change, chills, diaphoresis, fatigue, fever and unexpected weight change.  HENT:  Negative for congestion, rhinorrhea, sinus pressure, sneezing, sore throat and trouble swallowing.   Eyes:  Negative for photophobia and visual disturbance.  Respiratory:  Negative for cough, chest tightness, shortness of breath, wheezing and stridor.   Cardiovascular:  Negative for chest pain, palpitations and leg swelling.  Gastrointestinal:  Negative for abdominal distention, abdominal pain, anal bleeding, blood in stool, constipation, diarrhea, nausea and vomiting.  Genitourinary:  Negative for difficulty urinating, dysuria, flank pain and hematuria.  Musculoskeletal:  Negative for arthralgias, back pain, gait problem, joint swelling and myalgias.  Skin:  Negative for color change, pallor, rash and wound.  Neurological:  Negative for dizziness, tremors, weakness and light-headedness.  Hematological:  Negative for adenopathy. Does not bruise/bleed easily.  Psychiatric/Behavioral:   Negative for agitation, behavioral problems, confusion, decreased concentration, dysphoric mood and sleep disturbance.       Objective:   Physical Exam Constitutional:      Appearance: He is well-developed.  HENT:     Head: Normocephalic and atraumatic.  Eyes:     Conjunctiva/sclera: Conjunctivae normal.  Cardiovascular:     Rate and Rhythm: Normal rate and regular rhythm.  Pulmonary:     Effort: Pulmonary effort is normal. No respiratory distress.     Breath sounds: No wheezing.  Abdominal:     General: There is no distension.     Palpations: Abdomen is soft.  Musculoskeletal:        General: No tenderness. Normal range of motion.     Cervical back: Normal range of motion and neck supple.  Skin:    General: Skin is warm and dry.     Coloration: Skin is not pale.     Findings: No erythema or rash.  Neurological:     General: No focal deficit present.     Mental Status: He is alert and oriented to person, place, and time.  Psychiatric:        Mood and Affect: Mood normal.        Behavior: Behavior normal.        Thought Content: Thought content normal.        Judgment: Judgment normal.    Left knee 12/06/2020:     Left knee February 14, 2021:     Knee 05/12/2021:        Assessment & Plan:   Current prosthetic joint infection with MRSA status post single-stage revision:  She is finished 6 weeks of daptomycin and now on clindamycin through more than 6 months.  We will check sed rate CRP BMP CBC with differential.  Provide inflammatory markers are reassuring he will stop clindamycin we will reevaluate him in 2 months time.

## 2021-05-13 LAB — CBC WITH DIFFERENTIAL/PLATELET
Absolute Monocytes: 601 cells/uL (ref 200–950)
Basophils Absolute: 40 cells/uL (ref 0–200)
Basophils Relative: 0.6 %
Eosinophils Absolute: 198 cells/uL (ref 15–500)
Eosinophils Relative: 3 %
HCT: 42.7 % (ref 38.5–50.0)
Hemoglobin: 14.3 g/dL (ref 13.2–17.1)
Lymphs Abs: 1888 cells/uL (ref 850–3900)
MCH: 28.4 pg (ref 27.0–33.0)
MCHC: 33.5 g/dL (ref 32.0–36.0)
MCV: 84.7 fL (ref 80.0–100.0)
MPV: 10.2 fL (ref 7.5–12.5)
Monocytes Relative: 9.1 %
Neutro Abs: 3874 cells/uL (ref 1500–7800)
Neutrophils Relative %: 58.7 %
Platelets: 159 10*3/uL (ref 140–400)
RBC: 5.04 10*6/uL (ref 4.20–5.80)
RDW: 15.2 % — ABNORMAL HIGH (ref 11.0–15.0)
Total Lymphocyte: 28.6 %
WBC: 6.6 10*3/uL (ref 3.8–10.8)

## 2021-05-13 LAB — BASIC METABOLIC PANEL WITH GFR
BUN/Creatinine Ratio: 24 (calc) — ABNORMAL HIGH (ref 6–22)
BUN: 28 mg/dL — ABNORMAL HIGH (ref 7–25)
CO2: 27 mmol/L (ref 20–32)
Calcium: 9.1 mg/dL (ref 8.6–10.3)
Chloride: 106 mmol/L (ref 98–110)
Creat: 1.17 mg/dL (ref 0.70–1.30)
Glucose, Bld: 111 mg/dL — ABNORMAL HIGH (ref 65–99)
Potassium: 4.6 mmol/L (ref 3.5–5.3)
Sodium: 141 mmol/L (ref 135–146)
eGFR: 75 mL/min/{1.73_m2} (ref >=60)

## 2021-05-13 LAB — C-REACTIVE PROTEIN: CRP: 1.9 mg/L (ref <8.0)

## 2021-05-13 LAB — SEDIMENTATION RATE: Sed Rate: 2 mm/h (ref 0–20)

## 2021-05-17 ENCOUNTER — Encounter: Payer: Self-pay | Admitting: Infectious Disease

## 2021-05-18 ENCOUNTER — Encounter: Payer: Self-pay | Admitting: Infectious Disease

## 2021-05-20 ENCOUNTER — Encounter: Payer: Self-pay | Admitting: Infectious Disease

## 2021-07-13 ENCOUNTER — Ambulatory Visit (INDEPENDENT_AMBULATORY_CARE_PROVIDER_SITE_OTHER): Payer: No Typology Code available for payment source | Admitting: Infectious Disease

## 2021-07-13 ENCOUNTER — Other Ambulatory Visit: Payer: Self-pay

## 2021-07-13 VITALS — BP 137/89 | HR 59 | Temp 97.8°F | Ht 70.0 in | Wt 267.0 lb

## 2021-07-13 DIAGNOSIS — A498 Other bacterial infections of unspecified site: Secondary | ICD-10-CM

## 2021-07-13 DIAGNOSIS — T8454XD Infection and inflammatory reaction due to internal left knee prosthesis, subsequent encounter: Secondary | ICD-10-CM | POA: Diagnosis not present

## 2021-07-13 DIAGNOSIS — T8459XD Infection and inflammatory reaction due to other internal joint prosthesis, subsequent encounter: Secondary | ICD-10-CM

## 2021-07-13 MED ORDER — CLINDAMYCIN HCL 300 MG PO CAPS: 300.0000 mg | ORAL_CAPSULE | Freq: Four times a day (QID) | ORAL | 11 refills | Status: AC

## 2021-07-13 NOTE — Progress Notes (Signed)
? ?Chief complaint: For prosthetic knee infection ? ? ? ? Patient ID: Thomas [name], male    DOB: 1970-08-07, 51 y.o.   MRN: [id number] ? ?HPI ? 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  Again methicillin-resistant Staph epidermidis was isolated. ? ?We placed him on IV daptomycinHis knee pain is improved dramatically since we saw him in the hospital. ? ?Unfortunately do not have many different options to use for oral therapy for him other than clindamycin and potentially some more expensive options such as nuzyra or sivextro which would require prior authorizations [facility] suspect at [facility]--this is keeping in mind the sensitivity pattern of the Staphylococcus epidermidis that he had growth in 2020.  The organism 2022 actually was listed as being sensitive to Bactrim as opposed to the original 1 from 2 years prior. ? ? ?He has tolerated the clindamycin without difficulty. ? ?He is adherent to taking it 4 times a day. ? ?Knee pain was relatively stable when I saw him last and his inflammatory markers are reassuring.  We talked him off the clindamycin but unfortunately within a few weeks he was experiencing severe knee pain as well as systemic symptoms of fevers and chills and malaise.  We had him restart clindamycin with resolution of his fevers and chills and ultimately stabilization of his pain he also saw Dr. Alvan Dame for followup. ? ? ?He does continue to have knee pain though largely at night when it is most severe but during the day as well. ? ?It is definitely improved compared to when he had this flare when he was off antibiotics. ? ? ? ?Past Medical History:  ?Diagnosis Date  ? Anxiety   ? occasionally  ? Arthritis   ? Chest pain   ? Chronic infection of prosthetic knee ([place]) 12/06/2020  ? Dysrhythmia   ? SVT  ? GERD (gastroesophageal reflux disease)   ? HTN (hypertension)   ?  Pre-diabetes   ? Sleep apnea   ? cpap   ? Staphylococcus epidermidis infection 12/06/2020  ? SVT (supraventricular tachycardia) ([place])   ? ? ?Past Surgical History:  ?Procedure Laterality Date  ? ATRIAL ABLATION SURGERY    ? CONVERSION TO TOTAL KNEE Left 09/25/2017  ? Procedure: Left knee conversion from uni compartmental arthroplasty to total knee arthroplasty;  Surgeon: Paralee Cancel, MD;  [facility];  Service: Orthopedics;  Laterality: Left;  90 mins  ? EXCISIONAL TOTAL KNEE ARTHROPLASTY WITH ANTIBIOTIC SPACERS Left 09/17/2018  ? Procedure: Resection of left total knee arthroplasty and placement of antibiotic spacer;  Surgeon: Paralee Cancel, MD;  [facility];  Service: Orthopedics;  Laterality: Left;  90 mins  ? I & D KNEE WITH POLY EXCHANGE Left 07/16/2017  ? Procedure: LEFT KNEE [place] OUT WITH REIMPLANTATION OF EXISTING IMPLANT AFTER SALINE CLEANING.;  Surgeon: Paralee Cancel, MD;  [facility];  Service: Orthopedics;  Laterality: Left;  with block  ? I & D KNEE WITH POLY EXCHANGE Left 11/01/2020  ? Procedure: IRRIGATION AND DEBRIDEMENT KNEE WITH POLY EXCHANGE;  Surgeon: Paralee Cancel, MD;  [facility];  Service: Orthopedics;  Laterality: Left;  ? JOINT REPLACEMENT    ? total knee arthroplasty 09-25-17 Dr. Alvan Dame  ? ORIF TIBIA PLATEAU Left 06/12/2017  ? Procedure: Left open reduction internal fixation medial tibial plateau fracture with poly revision medial compartmental arthroplasty;  Surgeon: Paralee Cancel, MD;  Location: [facility];  Service: Orthopedics;  Laterality: Left;  90 mins  ? REIMPLANTATION OF TOTAL KNEE Left 12/26/2018  ? Procedure: REIMPLANTATION OF TOTAL KNEE;  Surgeon: Paralee Cancel, MD;  [facility];  Service: Orthopedics;  Laterality: Left;  90 mins  ? ? ?No family history on file. ? ?  ?Social History  ? ?Socioeconomic History  ? Marital status: Married  ?  Spouse name: Not on file  ? Number of children: 3  ? Years of education: Not on file  ? Highest education level: Not on file   ?Occupational History  ? Occupation: truck Geophysicist/field seismologist  ?Tobacco Use  ? Smoking status: Never  ? Smokeless tobacco: Former  ?  Types: Chew, Snuff  ?  Quit date: 2019  ? Tobacco comments:  ?  30 years ago   ?Vaping Use  ? Vaping Use: Never used  ?Substance and Sexual Activity  ? Alcohol use: Not Currently  ?  Alcohol/week: 12.0 standard drinks  ?  Types: 12 Standard drinks or equivalent per week  ?  Comment: no  ? Drug use: No  ? Sexual activity: Yes  ?Other Topics Concern  ? Not on file  ?Social History Narrative  ? Not on file  ? ?Social Determinants of Health  ? ?Financial Resource Strain: Not on file  ?Food Insecurity: Not on file  ?Transportation Needs: Not on file  ?Physical Activity: Not on file  ?Stress: Not on file  ?Social Connections: Not on file  ? ? ?Allergies  ?Allergen Reactions  ? Tylenol [Acetaminophen] Other (See Comments)  ?  Can't have because of liver issues  ? ? ? ?Current Outpatient Medications:  ?  buPROPion (WELLBUTRIN XL) 300 MG 24 hr tablet, Take 300 mg by mouth in the morning., Disp: , Rfl:  ?  celecoxib (CELEBREX) 200 MG capsule, Take 1 capsule (200 mg total) by mouth 2 (two) times daily., Disp: 60 capsule, Rfl: 0 ?  clindamycin (CLEOCIN) 300 MG capsule, Take 1 capsule (300 mg total) by mouth 4 (four) times daily., Disp: 120 capsule, Rfl: 11 ?  docusate sodium (COLACE) 100 MG capsule, Take 1 capsule (100 mg total) by mouth 2 (two) times daily. (Patient not taking: Reported on 05/12/2021), Disp: 10 capsule, Rfl: 0 ?  DULoxetine (CYMBALTA) 60 MG capsule, Take 60 mg by mouth in the morning., Disp: , Rfl:  ?  enalapril (VASOTEC) 10 MG tablet, Take 10 mg by mouth in the morning., Disp: , Rfl:  ?  hydrOXYzine (ATARAX/VISTARIL) 25 MG tablet, Take 25-50 mg by mouth See admin instructions. Take 1 tablet (25 mg) by mouth at supper & take 2 tablets (50 mg) by mouth at bedtime., Disp: , Rfl:  ?  melatonin 3 MG TABS tablet, Take 3 mg by mouth at bedtime., Disp: , Rfl:  ?  methocarbamol (ROBAXIN) 500 MG  tablet, Take 1 tablet (500 mg total) by mouth every 6 (six) hours as needed for muscle spasms. (Patient not taking: Reported on 05/12/2021), Disp: 40 tablet, Rfl: 0 ?  metoprolol tartrate (LOPRESSOR) 100 MG tablet, Take 100 mg by mouth 2 (two) times daily., Disp: , Rfl:  ?  Multiple Vitamins-Minerals (MULTIVITAMIN WITH MINERALS) tablet, Take 1 tablet by mouth daily., Disp: , Rfl:  ?  omeprazole (PRILOSEC) 20 MG capsule, Take 20 mg by mouth in the morning., Disp: , Rfl:  ?  oxyCODONE (OXY IR/ROXICODONE) 5 MG immediate release tablet, Take 1-2 tablets (5-10 mg total) by mouth every 4 (four) hours as needed for severe  pain. (Patient not taking: Reported on 02/14/2021), Disp: 56 tablet, Rfl: 0 ?  polyethylene glycol (MIRALAX / GLYCOLAX) 17 g packet, Take 17 g by mouth daily as needed for mild constipation. (Patient not taking: Reported on 05/12/2021), Disp: 14 each, Rfl: 0 ?  tamsulosin (FLOMAX) 0.4 MG CAPS capsule, Take 0.4 mg by mouth daily., Disp: , Rfl:  ?  traMADol (ULTRAM) 50 MG tablet, TAKE ONE TABLET BY MOUTH THREE TIMES A DAY FOR CHRONIC PAIN, Disp: , Rfl:  ?  traZODone (DESYREL) 100 MG tablet, Take 200 mg by mouth at bedtime., Disp: , Rfl:  ?  verapamil (CALAN-SR) 120 MG CR tablet, Take 120 mg by mouth in the morning., Disp: , Rfl:  ? ? ? ?Review of Systems  ?Constitutional:  Negative for activity change, appetite change, chills, diaphoresis, fatigue, fever and unexpected weight change.  ?HENT:  Negative for congestion, rhinorrhea, sinus pressure, sneezing, sore throat and trouble swallowing.   ?Eyes:  Negative for photophobia and visual disturbance.  ?Respiratory:  Negative for cough, chest tightness, shortness of breath, wheezing and stridor.   ?Cardiovascular:  Negative for chest pain, palpitations and leg swelling.  ?Gastrointestinal:  Negative for abdominal distention, abdominal pain, anal bleeding, blood in stool, constipation, diarrhea, nausea and vomiting.  ?Genitourinary:  Negative for difficulty  urinating, dysuria, flank pain and hematuria.  ?Musculoskeletal:  Positive for arthralgias. Negative for back pain, gait problem, joint swelling and myalgias.  ?Skin:  Negative for color change, pallor, rash and wound.  ?

## 2021-07-14 ENCOUNTER — Telehealth: Payer: Self-pay

## 2021-07-14 LAB — CBC WITH DIFFERENTIAL/PLATELET
Absolute Monocytes: 345 cells/uL (ref 200–950)
Basophils Absolute: 30 cells/uL (ref 0–200)
Basophils Relative: 0.6 %
Eosinophils Absolute: 255 cells/uL (ref 15–500)
Eosinophils Relative: 5.1 %
HCT: 42 % (ref 38.5–50.0)
Hemoglobin: 14 g/dL (ref 13.2–17.1)
Lymphs Abs: 1545 cells/uL (ref 850–3900)
MCH: 28.9 pg (ref 27.0–33.0)
MCHC: 33.3 g/dL (ref 32.0–36.0)
MCV: 86.6 fL (ref 80.0–100.0)
MPV: 10.6 fL (ref 7.5–12.5)
Monocytes Relative: 6.9 %
Neutro Abs: 2825 cells/uL (ref 1500–7800)
Neutrophils Relative %: 56.5 %
Platelets: 122 10*3/uL — ABNORMAL LOW (ref 140–400)
RBC: 4.85 10*6/uL (ref 4.20–5.80)
RDW: 14.5 % (ref 11.0–15.0)
Total Lymphocyte: 30.9 %
WBC: 5 10*3/uL (ref 3.8–10.8)

## 2021-07-14 LAB — BASIC METABOLIC PANEL WITH GFR
BUN: 17 mg/dL (ref 7–25)
CO2: 29 mmol/L (ref 20–32)
Calcium: 8.9 mg/dL (ref 8.6–10.3)
Chloride: 105 mmol/L (ref 98–110)
Creat: 0.84 mg/dL (ref 0.70–1.30)
Glucose, Bld: 134 mg/dL — ABNORMAL HIGH (ref 65–99)
Potassium: 4.4 mmol/L (ref 3.5–5.3)
Sodium: 140 mmol/L (ref 135–146)
eGFR: 105 mL/min/{1.73_m2} (ref >=60)

## 2021-07-14 LAB — SEDIMENTATION RATE: Sed Rate: 19 mm/h (ref 0–20)

## 2021-07-14 LAB — C-REACTIVE PROTEIN: CRP: 0.9 mg/L (ref <8.0)

## 2021-07-14 NOTE — Telephone Encounter (Signed)
-----   Message from Randall Hiss, MD sent at 07/14/2021  9:09 AM EDT ----- ?Patient's platelets were low he needs a repeat CBC either here or by PCP ?----- Message ----- ?From: Interface, [facility] Results In ?Sent: 07/13/2021   3:12 PM EDT ?To: Randall Hiss, MD ? ? ?

## 2021-07-14 NOTE — Telephone Encounter (Signed)
Called patient regarding labs. States he will have this redrawn at PCP appointment on 5/17. ?Juanita Laster, RMA  ?

## 2021-07-14 NOTE — Telephone Encounter (Signed)
Attempted to call patient regarding lab results. Not able to reach him at this time. Left voicemail requesting call back. Gwenette Wellons M Jamieon Lannen, RMA  

## 2021-10-12 NOTE — Progress Notes (Deleted)
Chief complaint: Follow-up for prosthetic joint infection   Patient ID: Thomas [name], male    DOB: December 23, 1970, 51 y.o.   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  Again methicillin-resistant Staph epidermidis was isolated.  We placed him on IV daptomycinHis knee pain is improved dramatically since we saw him in the hospital.  Unfortunately do not have many different options to use for oral therapy for him other than clindamycin and potentially some more expensive options such as nuzyra or sivextro which would require prior authorizations I suspect at [facility]--this is keeping in mind the sensitivity pattern of the Staphylococcus epidermidis that he had growth in 2020.  The organism 2022 actually was listed as being sensitive to Bactrim as opposed to the original 1 from 2 years prior.   He has tolerated the clindamycin without difficulty.  He  has been adherent to taking it 4 times a day.  Knee pain was relatively stable when I saw him last and his inflammatory markers have been reassuring.    We took him off the clindamycin but unfortunately within a few weeks he was experiencing severe knee pain as well as systemic symptoms of fevers and chills and malaise.  We had him restart clindamycin with resolution of his fevers and chills and ultimately stabilization of his pain he also saw Dr. Charlann Boxer for followup.   He did  continue to have knee pain though largely at night when it is most severe but during the day as well.  When I last saw him he definitely improved compared to when he came off antibiotics.    Past Medical History:  Diagnosis Date   Anxiety    occasionally   Arthritis    Chest pain    Chronic infection of prosthetic knee (HCC) 12/06/2020   Dysrhythmia    SVT   GERD (gastroesophageal reflux disease)    HTN  (hypertension)    Pre-diabetes    Sleep apnea    cpap    Staphylococcus epidermidis infection 12/06/2020   SVT (supraventricular tachycardia) (HCC)     Past Surgical History:  Procedure Laterality Date   ATRIAL ABLATION SURGERY     CONVERSION TO TOTAL KNEE Left 09/25/2017   Procedure: Left knee conversion from uni compartmental arthroplasty to total knee arthroplasty;  Surgeon: Durene Romans, MD;  Location: [facility];  Service: Orthopedics;  Laterality: Left;  90 mins   EXCISIONAL TOTAL KNEE ARTHROPLASTY WITH ANTIBIOTIC SPACERS Left 09/17/2018   Procedure: Resection of left total knee arthroplasty and placement of antibiotic spacer;  Surgeon: Durene Romans, MD;  Location: [facility];  Service: Orthopedics;  Laterality: Left;  90 mins   I & D KNEE WITH POLY EXCHANGE Left 07/16/2017   Procedure: LEFT KNEE WASH OUT WITH REIMPLANTATION OF EXISTING IMPLANT AFTER SALINE CLEANING.;  Surgeon: Durene Romans, MD;  Location: [facility];  Service: Orthopedics;  Laterality: Left;  with block   I & D KNEE WITH POLY EXCHANGE Left 11/01/2020   Procedure: IRRIGATION AND DEBRIDEMENT KNEE WITH POLY EXCHANGE;  Surgeon: Durene Romans, MD;  Location: [facility];  Service: Orthopedics;  Laterality: Left;   JOINT REPLACEMENT     total knee arthroplasty 09-25-17 Dr. Charlann Boxer   ORIF TIBIA PLATEAU Left 06/12/2017   Procedure: Left open reduction internal fixation medial tibial plateau fracture with poly revision medial compartmental arthroplasty;  Surgeon: Charlann Boxer,  Molli Hazard, MD;  Location: [facility];  Service: Orthopedics;  Laterality: Left;  90 mins   REIMPLANTATION OF TOTAL KNEE Left 12/26/2018   Procedure: REIMPLANTATION OF TOTAL KNEE;  Surgeon: Durene Romans, MD;  Location: [facility];  Service: Orthopedics;  Laterality: Left;  90 mins    No family history on file.    Social History   Socioeconomic History   Marital status: Married    Spouse name: Not on file   Number of children: 3   Years of education: Not on file   Highest education  level: Not on file  Occupational History   Occupation: truck driver  Tobacco Use   Smoking status: Never   Smokeless tobacco: Former    Types: Chew, Snuff    Quit date: 2019   Tobacco comments:    30 years ago   Vaping Use   Vaping Use: Never used  Substance and Sexual Activity   Alcohol use: Not Currently    Alcohol/week: 12.0 standard drinks of alcohol    Types: 12 Standard drinks or equivalent per week    Comment: no   Drug use: No   Sexual activity: Yes  Other Topics Concern   Not on file  Social History Narrative   Not on file   Social Determinants of Health   Financial Resource Strain: Not on file  Food Insecurity: Not on file  Transportation Needs: Not on file  Physical Activity: Not on file  Stress: Not on file  Social Connections: Not on file    Allergies  Allergen Reactions   Tylenol [Acetaminophen] Other (See Comments)    Can't have because of liver issues     Current Outpatient Medications:    buPROPion (WELLBUTRIN XL) 300 MG 24 hr tablet, Take 300 mg by mouth in the morning., Disp: , Rfl:    celecoxib (CELEBREX) 200 MG capsule, Take 1 capsule (200 mg total) by mouth 2 (two) times daily., Disp: 60 capsule, Rfl: 0   clindamycin (CLEOCIN) 300 MG capsule, Take 1 capsule (300 mg total) by mouth 4 (four) times daily., Disp: 120 capsule, Rfl: 11   docusate sodium (COLACE) 100 MG capsule, Take 1 capsule (100 mg total) by mouth 2 (two) times daily., Disp: 10 capsule, Rfl: 0   DULoxetine (CYMBALTA) 60 MG capsule, Take 60 mg by mouth in the morning., Disp: , Rfl:    enalapril (VASOTEC) 10 MG tablet, Take 10 mg by mouth in the morning., Disp: , Rfl:    hydrOXYzine (ATARAX/VISTARIL) 25 MG tablet, Take 25-50 mg by mouth See admin instructions. Take 1 tablet (25 mg) by mouth at supper & take 2 tablets (50 mg) by mouth at bedtime., Disp: , Rfl:    melatonin 3 MG TABS tablet, Take 3 mg by mouth at bedtime., Disp: , Rfl:    methocarbamol (ROBAXIN) 500 MG tablet, Take 1  tablet (500 mg total) by mouth every 6 (six) hours as needed for muscle spasms., Disp: 40 tablet, Rfl: 0   metoprolol tartrate (LOPRESSOR) 100 MG tablet, Take 100 mg by mouth 2 (two) times daily., Disp: , Rfl:    Multiple Vitamins-Minerals (MULTIVITAMIN WITH MINERALS) tablet, Take 1 tablet by mouth daily., Disp: , Rfl:    omeprazole (PRILOSEC) 20 MG capsule, Take 20 mg by mouth in the morning., Disp: , Rfl:    oxyCODONE (OXY IR/ROXICODONE) 5 MG immediate release tablet, Take 1-2 tablets (5-10 mg total) by mouth every 4 (four) hours as needed for severe pain., Disp: 56 tablet, Rfl:  0   polyethylene glycol (MIRALAX / GLYCOLAX) 17 g packet, Take 17 g by mouth daily as needed for mild constipation., Disp: 14 each, Rfl: 0   tamsulosin (FLOMAX) 0.4 MG CAPS capsule, Take 0.4 mg by mouth daily., Disp: , Rfl:    traMADol (ULTRAM) 50 MG tablet, TAKE ONE TABLET BY MOUTH THREE TIMES A DAY FOR CHRONIC PAIN, Disp: , Rfl:    traZODone (DESYREL) 100 MG tablet, Take 200 mg by mouth at bedtime., Disp: , Rfl:    verapamil (CALAN-SR) 120 MG CR tablet, Take 120 mg by mouth in the morning., Disp: , Rfl:     Review of Systems  Constitutional:  Negative for activity change, appetite change, chills, diaphoresis, fatigue, fever and unexpected weight change.  HENT:  Negative for congestion, rhinorrhea, sinus pressure, sneezing, sore throat and trouble swallowing.   Eyes:  Negative for photophobia and visual disturbance.  Respiratory:  Negative for cough, chest tightness, shortness of breath, wheezing and stridor.   Cardiovascular:  Negative for chest pain, palpitations and leg swelling.  Gastrointestinal:  Negative for abdominal distention, abdominal pain, anal bleeding, blood in stool, constipation, diarrhea, nausea and vomiting.  Genitourinary:  Negative for difficulty urinating, dysuria, flank pain and hematuria.  Musculoskeletal:  Negative for arthralgias, back pain, gait problem, joint swelling and myalgias.  Skin:   Negative for color change, pallor, rash and wound.  Neurological:  Negative for dizziness, tremors, weakness and light-headedness.  Hematological:  Negative for adenopathy. Does not bruise/bleed easily.  Psychiatric/Behavioral:  Negative for agitation, behavioral problems, confusion, decreased concentration, dysphoric mood and sleep disturbance.        Objective:   Physical Exam Constitutional:      Appearance: He is well-developed.  HENT:     Head: Normocephalic and atraumatic.  Eyes:     Conjunctiva/sclera: Conjunctivae normal.  Cardiovascular:     Rate and Rhythm: Normal rate and regular rhythm.  Pulmonary:     Effort: Pulmonary effort is normal. No respiratory distress.     Breath sounds: No wheezing.  Abdominal:     General: There is no distension.     Palpations: Abdomen is soft.  Musculoskeletal:        General: No tenderness. Normal range of motion.     Cervical back: Normal range of motion and neck supple.  Skin:    General: Skin is warm and dry.     Coloration: Skin is not pale.     Findings: No erythema or rash.  Neurological:     General: No focal deficit present.     Mental Status: He is alert and oriented to person, place, and time.  Psychiatric:        Mood and Affect: Mood normal.        Behavior: Behavior normal.        Thought Content: Thought content normal.        Judgment: Judgment normal.     Left knee 12/06/2020:     Left knee February 14, 2021:     Knee 05/12/2021:        Assessment & Plan:   Recurrent prosthetic joint infection due to methicillin resistant Staphylococcus epidermidis  Recheck a sed rate CRP CMP and CBC with differential  The most recent organisms was S to TMP/SMX but Staph epi from 2020 was R to TMP/SMX

## 2021-10-13 ENCOUNTER — Ambulatory Visit: Payer: No Typology Code available for payment source | Admitting: Infectious Disease
